# Patient Record
Sex: Female | Born: 1960 | Hispanic: No | Smoking: Current some day smoker
Health system: Southern US, Community
[De-identification: ages and names within clinical notes are randomized; demographics above are authoritative.]

## PROBLEM LIST (undated history)

## (undated) DIAGNOSIS — E119 Type 2 diabetes mellitus without complications: Secondary | ICD-10-CM

## (undated) DIAGNOSIS — M549 Dorsalgia, unspecified: Secondary | ICD-10-CM

## (undated) DIAGNOSIS — Z9889 Other specified postprocedural states: Secondary | ICD-10-CM

## (undated) DIAGNOSIS — M1990 Unspecified osteoarthritis, unspecified site: Secondary | ICD-10-CM

## (undated) DIAGNOSIS — R112 Nausea with vomiting, unspecified: Secondary | ICD-10-CM

## (undated) DIAGNOSIS — B019 Varicella without complication: Secondary | ICD-10-CM

## (undated) DIAGNOSIS — G43909 Migraine, unspecified, not intractable, without status migrainosus: Secondary | ICD-10-CM

## (undated) HISTORY — DX: Dorsalgia, unspecified: M54.9

## (undated) HISTORY — DX: Varicella without complication: B01.9

## (undated) HISTORY — PX: CHOLECYSTECTOMY: SHX55

## (undated) HISTORY — PX: BACK SURGERY: SHX140

## (undated) HISTORY — DX: Migraine, unspecified, not intractable, without status migrainosus: G43.909

---

## 2011-01-07 ENCOUNTER — Emergency Department: Payer: Self-pay | Admitting: Unknown Physician Specialty

## 2011-01-13 ENCOUNTER — Inpatient Hospital Stay: Payer: Self-pay | Admitting: Surgery

## 2011-09-20 ENCOUNTER — Observation Stay: Payer: Self-pay | Admitting: Surgery

## 2011-09-22 ENCOUNTER — Emergency Department: Payer: Self-pay | Admitting: *Deleted

## 2011-10-29 ENCOUNTER — Emergency Department: Payer: Self-pay | Admitting: Emergency Medicine

## 2011-11-07 ENCOUNTER — Emergency Department: Payer: Self-pay | Admitting: Emergency Medicine

## 2011-12-03 ENCOUNTER — Emergency Department: Payer: Self-pay | Admitting: Emergency Medicine

## 2011-12-03 LAB — CBC
HCT: 49.7 % — ABNORMAL HIGH (ref 35.0–47.0)
HGB: 16.8 g/dL — ABNORMAL HIGH (ref 12.0–16.0)
MCH: 28.9 pg (ref 26.0–34.0)
MCHC: 33.7 g/dL (ref 32.0–36.0)
MCV: 86 fL (ref 80–100)
RDW: 14.1 % (ref 11.5–14.5)

## 2011-12-03 LAB — COMPREHENSIVE METABOLIC PANEL
Anion Gap: 15 (ref 7–16)
Bilirubin,Total: 0.5 mg/dL (ref 0.2–1.0)
Chloride: 106 mmol/L (ref 98–107)
Co2: 18 mmol/L — ABNORMAL LOW (ref 21–32)
Creatinine: 0.83 mg/dL (ref 0.60–1.30)
EGFR (African American): >60
EGFR (Non-African Amer.): >60
Glucose: 130 mg/dL — ABNORMAL HIGH (ref 65–99)
Osmolality: 279 (ref 275–301)
Potassium: 3.5 mmol/L (ref 3.5–5.1)
SGPT (ALT): 31 U/L
Sodium: 139 mmol/L (ref 136–145)

## 2011-12-03 LAB — URINALYSIS, COMPLETE
Bilirubin,UR: NEGATIVE
Blood: NEGATIVE
Glucose,UR: NEGATIVE mg/dL (ref 0–75)
Nitrite: NEGATIVE
Ph: 5 (ref 4.5–8.0)
Specific Gravity: 1.034 (ref 1.003–1.030)
Squamous Epithelial: 8

## 2011-12-03 LAB — LIPASE, BLOOD: Lipase: 74 U/L (ref 73–393)

## 2012-02-23 ENCOUNTER — Inpatient Hospital Stay: Payer: Self-pay | Admitting: Internal Medicine

## 2012-02-23 LAB — COMPREHENSIVE METABOLIC PANEL
Albumin: 3.7 g/dL (ref 3.4–5.0)
Alkaline Phosphatase: 105 U/L (ref 50–136)
BUN: 10 mg/dL (ref 7–18)
Bilirubin,Total: 0.4 mg/dL (ref 0.2–1.0)
Calcium, Total: 8.9 mg/dL (ref 8.5–10.1)
Co2: 19 mmol/L — ABNORMAL LOW (ref 21–32)
Creatinine: 1.03 mg/dL (ref 0.60–1.30)
EGFR (Non-African Amer.): >60
Osmolality: 279 (ref 275–301)
SGOT(AST): 17 U/L (ref 15–37)
Sodium: 139 mmol/L (ref 136–145)

## 2012-02-23 LAB — URINALYSIS, COMPLETE
Blood: NEGATIVE
Nitrite: NEGATIVE
Ph: 5 (ref 4.5–8.0)
Squamous Epithelial: 8
WBC UR: 20 /HPF (ref 0–5)

## 2012-02-23 LAB — CBC
HGB: 16.6 g/dL — ABNORMAL HIGH (ref 12.0–16.0)
MCV: 86 fL (ref 80–100)
Platelet: 396 10*3/uL (ref 150–440)
RDW: 14.6 % — ABNORMAL HIGH (ref 11.5–14.5)

## 2012-02-23 LAB — SEDIMENTATION RATE: Erythrocyte Sed Rate: 8 mm/hr (ref 0–20)

## 2012-02-23 LAB — CLOSTRIDIUM DIFFICILE BY PCR

## 2012-02-23 LAB — IRON AND TIBC
Iron Bind.Cap.(Total): 332 ug/dL (ref 250–450)
Unbound Iron-Bind.Cap.: 274 ug/dL

## 2012-02-23 LAB — OCCULT BLOOD X 1 CARD TO LAB, STOOL: Occult Blood, Feces: NEGATIVE

## 2012-02-24 LAB — CBC WITH DIFFERENTIAL/PLATELET
Basophil #: 0 10*3/uL (ref 0.0–0.1)
Basophil %: 0.5 %
Eosinophil #: 0.1 10*3/uL (ref 0.0–0.7)
Eosinophil %: 1.2 %
HCT: 38.5 % (ref 35.0–47.0)
HGB: 13 g/dL (ref 12.0–16.0)
Lymphocyte #: 2 10*3/uL (ref 1.0–3.6)
Lymphocyte %: 31.9 %
MCH: 29.4 pg (ref 26.0–34.0)
MCHC: 33.9 g/dL (ref 32.0–36.0)
MCV: 87 fL (ref 80–100)
Monocyte %: 8 %
Neutrophil #: 3.6 10*3/uL (ref 1.4–6.5)
Neutrophil %: 58.4 %
Platelet: 284 10*3/uL (ref 150–440)
RBC: 4.43 10*6/uL (ref 3.80–5.20)

## 2012-02-24 LAB — MAGNESIUM: Magnesium: 1.7 mg/dL — ABNORMAL LOW

## 2012-02-24 LAB — BASIC METABOLIC PANEL
Calcium, Total: 8.2 mg/dL — ABNORMAL LOW (ref 8.5–10.1)
Chloride: 111 mmol/L — ABNORMAL HIGH (ref 98–107)
Creatinine: 0.6 mg/dL (ref 0.60–1.30)
EGFR (Non-African Amer.): >60
Osmolality: 283 (ref 275–301)
Sodium: 144 mmol/L (ref 136–145)

## 2012-02-24 LAB — HEMOGLOBIN A1C: Hemoglobin A1C: 5.9 % (ref 4.2–6.3)

## 2012-02-29 LAB — CULTURE, BLOOD (SINGLE)

## 2012-10-31 IMAGING — CR CERVICAL SPINE - 2-3 VIEW
1 series · 3 of 3 positions shown · non-contrast
Comparison: none

REASON FOR EXAM: neck pain
COMMENTS:

[Series 4: w cervical spine lat · 0.14mm/px · 3 of 3 slices shown]
[im 1/3]
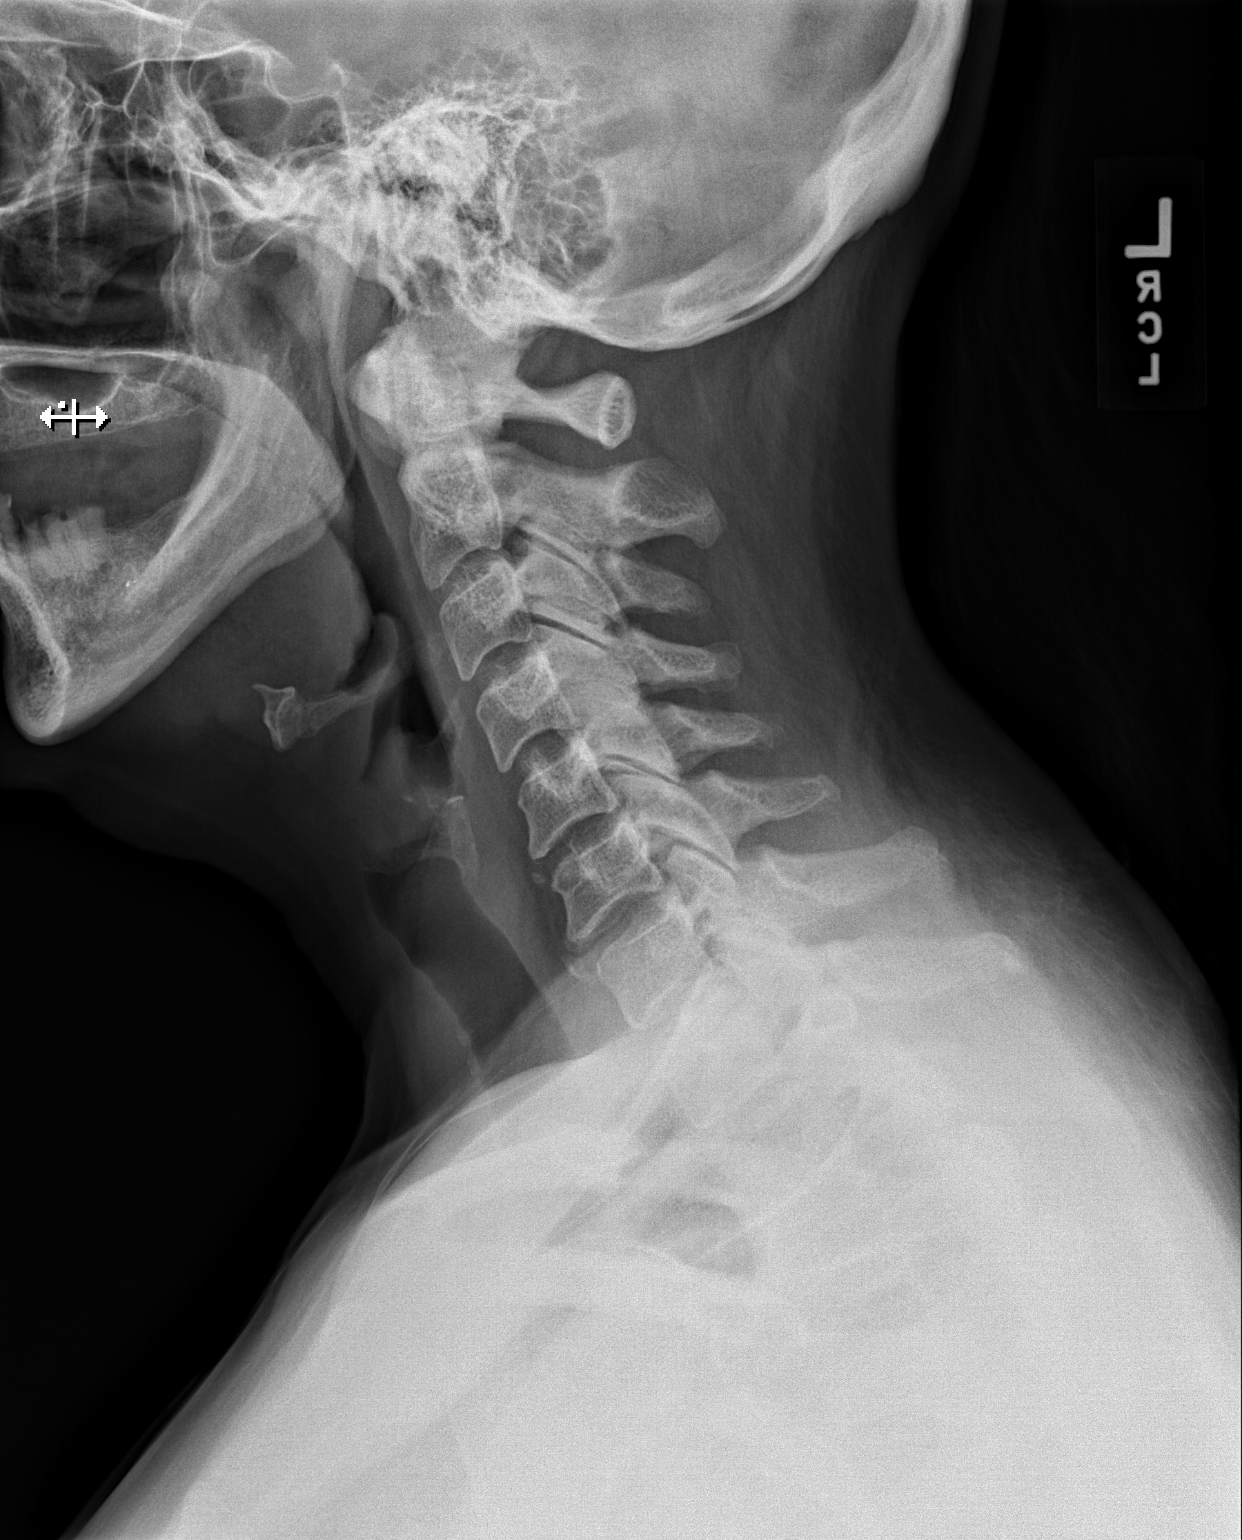
[im 2/3]
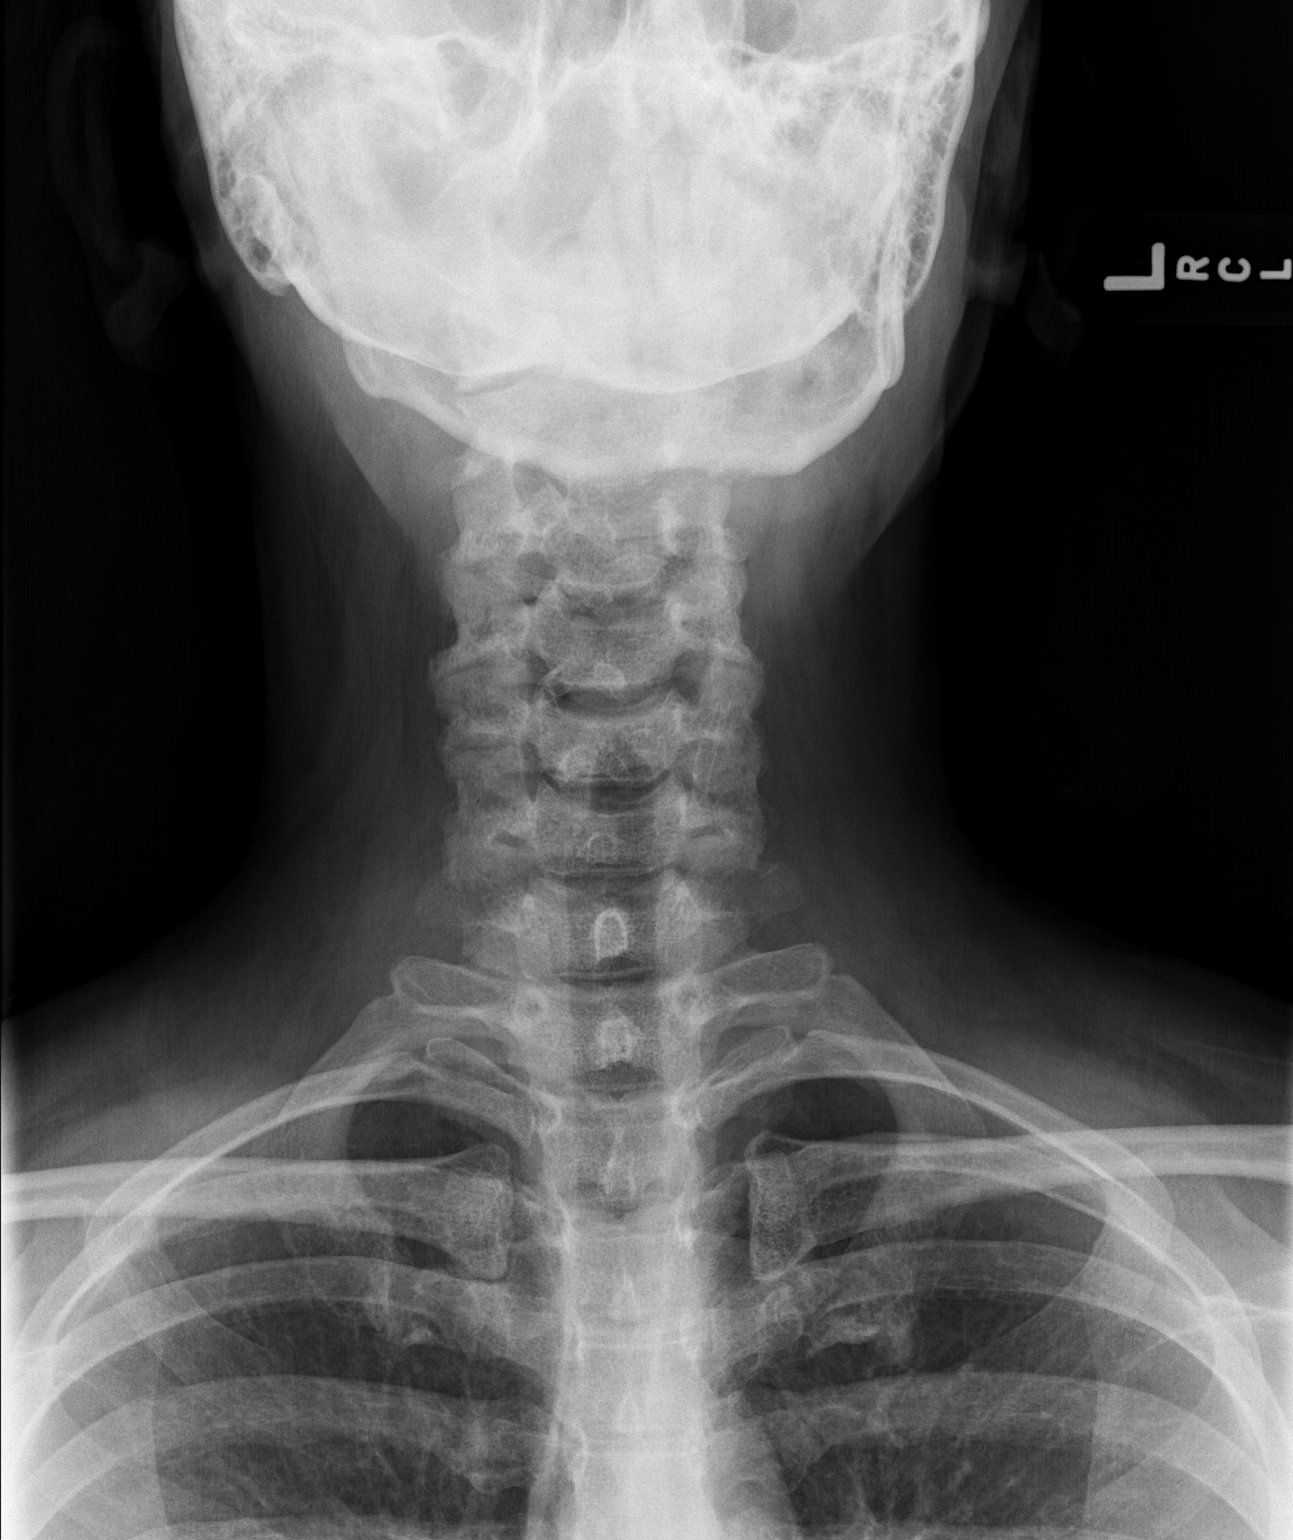
[im 3/3]
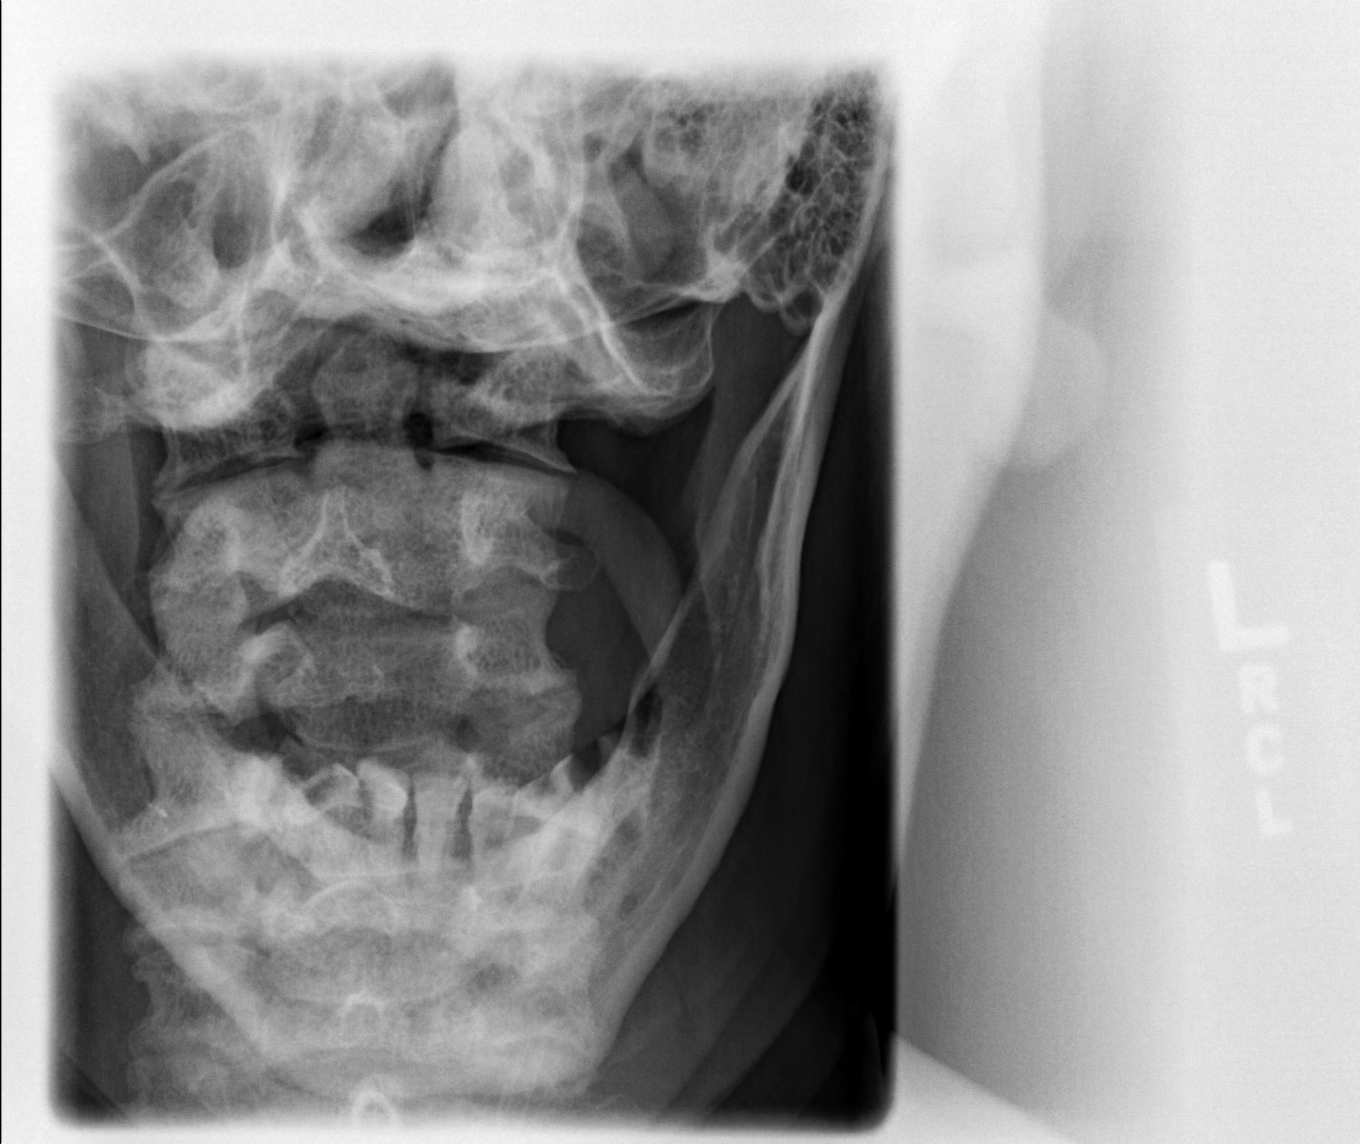

[3 of 3 positions shown; findings below may reference images not displayed]

PROCEDURE:     DXR - DXR C- SPINE AP AND LATERAL  - November 07, 2011  [DATE]

RESULT:     AP, lateral, and odontoid views of the cervical spine reveal the
vertebral bodies to be preserved in height. The intervertebral disc space
heights are well-maintained. Tiny anterior osteophytes or ligamentous
calcification is seen at the C5-C6 and C6-C7 levels. I see no evidence of a
perched facet or spinous process fracture. The odontoid is intact and the
lateral masses of C1 align normally with those of C2.
IMPRESSION: I see no acute abnormality of the cervical spine. Very mild
degenerative disc changes noted in the mid to lower cervical spine. Mild
facet joint hypertrophy is seen as well at the C4-C5 level.

## 2014-12-22 ENCOUNTER — Emergency Department
Admit: 2014-12-22 | Disposition: A | Discharge status: Home / Self Care | Payer: Self-pay | Admitting: Emergency Medicine

## 2014-12-22 LAB — CBC WITH DIFFERENTIAL/PLATELET
BASOS ABS: 0.1 10*3/uL (ref 0.0–0.1)
Basophil %: 0.4 %
EOS ABS: 0.1 10*3/uL (ref 0.0–0.7)
Eosinophil %: 0.7 %
HCT: 43.2 % (ref 35.0–47.0)
HGB: 14.2 g/dL (ref 12.0–16.0)
LYMPHS PCT: 14 %
Lymphocyte #: 2.5 10*3/uL (ref 1.0–3.6)
MCH: 28.2 pg (ref 26.0–34.0)
MCHC: 32.9 g/dL (ref 32.0–36.0)
MCV: 86 fL (ref 80–100)
MONOS PCT: 6.1 %
Monocyte #: 1.1 x10 3/mm — ABNORMAL HIGH (ref 0.2–0.9)
NEUTROS ABS: 14.3 10*3/uL — AB (ref 1.4–6.5)
Neutrophil %: 78.8 %
PLATELETS: 316 10*3/uL (ref 150–440)
RBC: 5.05 10*6/uL (ref 3.80–5.20)
RDW: 14.5 % (ref 11.5–14.5)
WBC: 18.2 10*3/uL — AB (ref 3.6–11.0)

## 2014-12-22 LAB — COMPREHENSIVE METABOLIC PANEL
ALBUMIN: 3.3 g/dL — AB
ALK PHOS: 73 U/L
ANION GAP: 8 (ref 7–16)
BUN: 12 mg/dL
Bilirubin,Total: 0.5 mg/dL
CHLORIDE: 109 mmol/L
CO2: 20 mmol/L — AB
CREATININE: 0.73 mg/dL
Calcium, Total: 8.7 mg/dL — ABNORMAL LOW
EGFR (African American): >60
EGFR (Non-African Amer.): >60
Glucose: 185 mg/dL — ABNORMAL HIGH
Potassium: 3.2 mmol/L — ABNORMAL LOW
SGOT(AST): 24 U/L
SGPT (ALT): 14 U/L
SODIUM: 137 mmol/L
Total Protein: 6.1 g/dL — ABNORMAL LOW

## 2014-12-23 LAB — URINALYSIS, COMPLETE
Bilirubin,UR: NEGATIVE
Blood: NEGATIVE
GLUCOSE, UR: NEGATIVE mg/dL (ref 0–75)
Ketone: NEGATIVE
LEUKOCYTE ESTERASE: NEGATIVE
NITRITE: NEGATIVE
PH: 5 (ref 4.5–8.0)
Protein: NEGATIVE
RBC,UR: 5 /HPF (ref 0–5)
Specific Gravity: 1.02 (ref 1.003–1.030)
Squamous Epithelial: 15
WBC UR: 11 /HPF (ref 0–5)

## 2015-01-14 NOTE — Consult Note (Signed)
PATIENT NAME:  Laura Small, KERSHAW MR#:  [id number] DATE OF BIRTH:  1961/08/04  DATE OF CONSULTATION:  02/23/2012  REFERRING PHYSICIAN:  Dr. Hyacinth Meeker CONSULTING PHYSICIAN:  Lurline Del, MD  REASON FOR CONSULTATION: Recurrent diarrhea.   HISTORY OF PRESENT ILLNESS: The patient is a 54 year old female who was admitted today after she developed sudden onset abdominal pain as well as diarrhea about two days ago. The pain was mostly located in the left lower quadrant area although it is now mostly diffuse. She also gives history of some reddish-brown stools, raising some concerns about the presence of blood. She had several bouts of diarrhea the day before yesterday as well as yesterday. She had only one loose bowel movement today. She denies any fever or chills, denies any travel, denies any sick contacts. The patient was in the hospital in January of this year as well as March with similar symptoms. She has been treated with Cipro and Flagyl. This appears to be her third episode since January of this year. The patient has had CT scans in the past which showed some inflammation in the terminal ileum, raising concern about possible inflammatory bowel disease. According to the patient, her mother recently died in the hospital; and she has been sleeping in her bed at home without washing the bed sheets or any other belongings.   PAST MEDICAL/SURGICAL HISTORY: Significant for cholecystectomy.   MEDICATIONS: None.   ALLERGIES: Bee stings.   SOCIAL HISTORY: She smokes about 1/2 pack a day. She does not drink except occasionally.   REVIEW OF SYSTEMS: Review of systems is grossly negative.   PHYSICAL EXAMINATION:  GENERAL: A fairly well-built female, does not appear to be in any acute distress.   VITAL SIGNS: Temperature 97.7, pulse 75, respirations 18 to 20, blood pressure 116/75.   SKIN: Skin is unremarkable.   NECK: Veins are flat.   LUNGS: Clear to auscultation bilaterally.   CARDIOVASCULAR:  Regular rate and rhythm.   ABDOMEN: Examination showed a soft and benign abdomen. There is diffuse abdominal tenderness without any rebound or guarding.   NEUROLOGICAL: Examination appears to be unremarkable.   LABORATORY, DIAGNOSTIC AND RADIOLOGICAL DATA:  Serum lipase is normal. Iron saturation is normal. Serum ferritin is normal.  Liver enzymes are normal.  Electrolytes, BUN, and creatinine are normal.  Sedimentation rate is only 8, white cell count is 15.8, hemoglobin 16.6.  Urinalysis: 20 white blood cells per high-power field, 3+ bacteria although there are some epithelial cells. Leukocyte esterase is trace. Nitrites negative. Urine cultures are pending.  Stool studies are positive for C. difficile toxin.   ASSESSMENT AND PLAN: The patient is with abdominal pain and diarrhea. This appears to be a case of C. difficile colitis. The patient has history of being treated with antibiotics in March. The C. difficile may have been related to her using her mother's belongings, who died in the hospital. The patient is currently on IV Cipro and Flagyl. I will discontinue Cipro, change Flagyl to p.o., gradually advance diet as tolerated. CT scan again is showing some thickening of the wall of the small intestine raising concern about possible chronic enteritis. Her sedimentation rate is normal. IBD panel is pending. I believe, once she is fully treated for C. difficile colitis, performing a colonoscopy would be beneficial for further evaluation of the colon and small intestine. The patient may require a capsule endoscopy as well. This has been discussed with the patient and she is in full agreement. I will follow.  ____________________________ Wynelle Link  Niel HummerIftikhar, MD si:cbb D: 02/23/2012 18:33:57 ET T: 02/24/2012 10:21:12 ET JOB#: [id number]  cc: Lurline DelShaukat Jamica Woodyard, MD, <Dictator> Lurline DelSHAUKAT Erisa Mehlman MD ELECTRONICALLY SIGNED 02/26/2012 12:18

## 2015-01-14 NOTE — Discharge Summary (Signed)
PATIENT NAME:  Laura Small, Laura Small MR#:  [id number] DATE OF BIRTH:  [birth year]  DATE OF ADMISSION:  02/23/2012 DATE OF DISCHARGE:  02/24/2012  PATIENT SIGNED OUT AGAINST MEDICAL ADVICE.   DISCHARGE DIAGNOSES:  1. Clostridium difficile colitis. 2. Possibility of inflammatory bowel disease with terminal ileitis.  3. Dehydration. 4. Hemoconcentration.  5. Leukocytosis.  6. Hyperglycemia. 7. Acidosis. 8. Tobacco abuse.   DISPOSITION: Patient signed out AGAINST MEDICAL ADVICE.   LABORATORY, DIAGNOSTIC, AND RADIOLOGICAL DATA: CT of the abdomen showed terminal ileitis/enteritis. Stool is positive for Clostridium difficile. Blood cultures negative. Urine culture mixed bacterial organisms. White count 15 on admission, 6.2 by the time of discharge. Hemoglobin 16.6 on admission, 13 by the time of discharge. Glucose 136. Normal iron studies. Magnesium 1.7. Hemoglobin A1c 5.9.   CONSULTATION: GI consultation with Dr. Niel HummerIftikhar.   HOSPITAL COURSE: Patient is a 54 year old female with no significant past medical history who presented with nausea, vomiting, abdominal pain and diarrhea. On CAT scan she was found to have enteritis/terminal ileitis. This was her third episode of similar complaints since December 2012. She was admitted to the hospital and started her on empiric antibiotics. GI consultation was also obtained. There was clinical concern for inflammatory bowel disease. Patient's stool was sent for additional testing and became positive for C. difficile. Before patient could be seen she signed herself out AGAINST MEDICAL ADVICE on 02/23/2012.    TIME SPENT: 45 minutes.   ____________________________ Darrick MeigsSangeeta Keilee Denman, MD sp:cms D: 02/24/2012 14:47:21 ET T: 02/25/2012 10:16:46 ET JOB#: [id number]  cc: Darrick MeigsSangeeta Shyanne Mcclary, MD, <Dictator>  Darrick MeigsSANGEETA Tyberius Ryner MD ELECTRONICALLY SIGNED 02/25/2012 14:58

## 2015-01-14 NOTE — H&P (Signed)
PATIENT NAME:  Laura Small, Laura Small MR#:  [id number] DATE OF BIRTH:  1961/05/03  DATE OF ADMISSION:  02/23/2012  ER REFERRING PHYSICIAN: Arman Filter, MD  PRIMARY CARE PHYSICIAN: Rudie Meyer, MD  CHIEF COMPLAINT: Nausea, diarrhea, and abdominal pain.   HISTORY OF PRESENT ILLNESS: The patient is a 54 year old female with no significant past medical history who developed sudden onset of abdominal pain, in the left lower quadrant/mid quadrant, and developed sudden onset of abdominal pain in the left lower quadrant and mid quadrant two days ago. Subsequently she developed nausea and diarrhea. The patient reports that she has not actually vomited. She is having multiple bowel movements described as watery, root beer, which is reddish-brown color. The diarrhea is keeping her up at night. She has constant abdominal pain with intermittent cramping prior to diarrhea. She reports that she cannot afford to eat out therefore mostly eats at home. She denies eating any recent canned food, any sick contacts. She does have several pets none of which have been sick. She denies any recent travel. She was treated with Cipro and Flagyl in January and March. Review of old records show that this is the patient's third episode since December of 2012 with similar presentation of nausea, abdominal pain, and diarrhea. She has had two CAT scans, one in March of 2013 and the second one in March of 2012, which have shown inflammation in her ileum/small bowel. The patient reports that this was her third episode of possible gastroenteritis since December. In March she came in with similar symptoms, but at that time was taking Lipozene which had precipitated her gastrointestinal symptoms. The first two times she was diagnosed with gastroenteritis and treated with Cipro and Flagyl. As mentioned earlier, this is her third episode of similar symptoms since December 2012. She reports no family history of inflammatory bowel disease in any of  her family members. She reports that she has been under a lot of stress in the last two weeks, especially in the last five days.   ALLERGIES: Bee stings.  MEDICATIONS: None.   PAST MEDICAL HISTORY: None.   PAST SURGICAL HISTORY:  1. Cholecystectomy 01/02/2012.  2. Back surgery March 2001.   SOCIAL HISTORY: The patient smokes five cigarettes per day. Occasional alcohol use. Denies any drug abuse. Lives with her boyfriend. 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   FAMILY HISTORY: Mother has diabetes. Father had heart disease.   REVIEW OF SYSTEMS: CONSTITUTIONAL: Denies any fever, fatigue, or weakness. EYES: Denies any blurred or double vision. Wears glasses. ENT: Denies any tinnitus or ear pain. RESPIRATORY: Denies any cough or wheezing. CARDIOVASCULAR: Denies any chest pain or palpitations. GI: Reports nausea. No vomiting. Reports diarrhea and abdominal pain. GU: Denies any dysuria or hematuria. ENDOCRINE: Denies any polyuria or nocturia. HEME/LYMPH: Denies any anemia or easy bruisability. INTEGUMENT: Denies any acne or rash. MUSCULOSKELETAL: Denies any swelling or gout. NEUROLOGICAL: Denies any numbness or weakness. PSYCH: Has been having a lot of anxiety and stress recently.   PHYSICAL EXAMINATION:   VITAL SIGNS: Temperature 96.2, heart rate 128 initially, respiratory rate 26, blood pressure 172/84, pulse oximetry 98%.   GENERAL: The patient is a 54 year old female sitting comfortably in bed, not in acute distress.   HEAD: Atraumatic, normocephalic.   EYES: There is no pallor, icterus, or cyanosis. Pupils are equal, round, and reactive to light and accommodation. Extraocular movements intact.   ENT: Wet mucous membranes. Poor dental hygiene.   NECK: Supple.  No masses. No JVD. No thyromegaly or lymphadenopathy.   CHEST WALL: No tenderness to palpation. Not using accessory muscles of respiration. No intercostal muscle retractions.   LUNGS:  Bilaterally clear to auscultation. No wheezing, rales, or rhonchi.  HEART: S1 and S2 regular. No murmur, rubs, or gallops.   ABDOMEN: Soft. Nondistended. No guarding or rigidity. Hyperactive bowel sounds. The patient has tenderness to palpation in her left lower quadrant and in the periumbilical region. The patient is very tender to palpation in her left lower quadrant and around the periumbilical region.   MUSCULOSKELETAL: No cyanosis or clubbing.   SKIN: No rashes or lesions.   PERIPHERIES: No pedal edema. 2+ pedal pulses.   MUSCULOSKELETAL: No cyanosis or clubbing.   NEUROLOGIC: Awake, alert, and oriented x3. Cranial nerves grossly intact. Nonfocal neurological exam.   PSYCH: Normal mood and affect.  RESULTS: CT of the abdomen shows terminal ileitis similar to prior CAT scans.   Urinalysis shows evidence of urinary tract infection.  Glucose 136, BUN 10, creatinine 1.03, sodium 139, potassium 3.6, chloride 107, CO2 19, calcium 8.9, bilirubin 0.4. The rest of the hepatic panel is normal. Lipase is normal. White count 15.8, hemoglobin 16.6, hematocrit 49.8, platelets 396.   ASSESSMENT AND PLAN:  1. A 54 year old female with no significant past medical history who presents with nausea, abdominal pain and diarrhea. This is the patient's third episode of similar symptoms since December 2012. In the past, she was treated for possible gastroenteritis. Differential diagnoses includes irritable bowel syndrome, inflammatory bowel disease, and gastroenteritis. The patient reports that she has been under a lot of stress recently which is a risk factor for irritable bowel disease; however, she has elevated white count with reddish-brown diarrhea with terminal ileitis seen on three abdominal CT scans which favors inflammatory bowel disease. We will admit the patient to the hospital, start her on IV fluids and empiric antibiotics with Cipro and Flagyl, p.r.n. antiemetics and analgesics. We will check  stool for C. difficile, Giardia ova and parasite, comprehensive culture, and occult blood. We will also check ANCA CRP, ferritin, iron studies, ASCA antibodies, ESR, and vitamin B12. We will get a gastroenterology consultation.  2. Initial tachycardia and elevated blood pressure. The patient has no diagnosis of hypertension. This was likely related to acute distress resulting from significant abdominal pain. The patient's heart rate and blood pressure are currently well controlled. We will continue to monitor.  3. Urinary tract infection. The patient's urinalysis is suggestive of a UTI. We will get urine cultures and empirically treat with Cipro until final ID and sensitivities are available.  4. Leukocytosis, likely due to above. We will monitor closely on antibiotics.  5. Hemoconcentration with elevated hemoglobin and hematocrit. Likely due to dehydration resulting from diarrhea. We will hydrate with IV fluids.  6. Hyperglycemia. The patient has no history of diabetes. We will check hemoglobin A1c.  7. Acidosis. Likely due to ongoing diarrhea and GI symptoms. We will hydrate with IV fluids and monitor closely.  8. History of smoking. The patient was counseled about cessation for more than three minutes.  I reviewed all medical records and discussed with the patient and her brother at bedside the plan of care and management.   TIME SPENT: 75 minutes.  ____________________________ Cherre Huger, MD sp:slb D: 02/23/2012 08:46:33 ET T: 02/23/2012 09:27:01 ET JOB#: [id number]  cc: Cherre Huger, MD, <Dictator> Rudie Meyer, MD Cherre Huger MD ELECTRONICALLY SIGNED 02/24/2012 15:20

## 2015-01-14 NOTE — Discharge Summary (Signed)
PATIENT NAME:  Laura MochaCRADDOCK, Fatimah MR#:  [id number] DATE OF BIRTH:  10/08/1960  DATE OF ADMISSION:  09/20/2011 DATE OF DISCHARGE:  09/21/2011  FINAL DIAGNOSIS: Gastroenteritis.   PROCEDURE:  1. CT scan of the abdomen and pelvis.  2. Intravenous Cipro and Flagyl.   HOSPITAL COURSE: The patient was admitted to the surgical service with significant right lower quadrant abdominal pain. CAT scan demonstrated findings consistent with enteritis in the terminal ileum. Clinically she improved rapidly with hydration, intravenous narcotics, and intravenous Cipro and Flagyl. She had several loose stools probably secondary to the oral contrast administered the night before. Over the course of December 30th the patient continued to improve and tolerated a clear liquid diet. Repeat examination on the evening of the 30th demonstrated her abdomen to be benign and her vital signs to be stable. Her white blood cell count fell from 12.8 on admission to 8.1. She remained hemodynamically stable with no signs of fever. She was deemed suitable for discharge with follow-up in the office on 10/03/2011 in [place] in [place].   DISCHARGE MEDICATIONS:  1. Cipro 500 mg by mouth b.i.d. for seven days. 2. Flagyl 500 mg by mouth t.i.d. for seven days.  3. Imodium as needed over-the-counter take as instructed as loose stools may or may not continue.   ____________________________ Redge GainerMark A. Egbert GaribaldiBird, MD mab:rbg D: 09/21/2011 20:23:56 ET T: 09/24/2011 13:36:24 ET JOB#: [id number]  cc: Loraine LericheMark A. Egbert GaribaldiBird, MD, <Dictator> Eugene Isadore Kela MillinA Janiel Crisostomo MD ELECTRONICALLY SIGNED 09/25/2011 8:35

## 2015-01-14 NOTE — Consult Note (Signed)
Brief Consult Note: Diagnosis: C. diff colitis.   Patient was seen by consultant.   Consult note dictated.   Orders entered.   Comments: C. diff colitis. ? Underlying enteritis such as IBD.  Recommendations: DC Cipro. Change Flagyl to PO. Recommend OP colonoscopy =/- capsule endoscopy for further evaluation of suspected IBD. Will follow.  Electronic Signatures: Lurline DelIftikhar, Janith Nielson (MD)  (Signed 03-Jun-13 18:28)  Authored: Brief Consult Note   Last Updated: 03-Jun-13 18:28 by Lurline DelIftikhar, Jovana Rembold (MD)

## 2015-01-20 ENCOUNTER — Other Ambulatory Visit: Payer: Self-pay

## 2015-01-20 ENCOUNTER — Emergency Department
Admit: 2015-01-20 | Disposition: A | Discharge status: Home / Self Care | Payer: Self-pay | Admitting: Emergency Medicine

## 2015-01-20 LAB — CBC WITH DIFFERENTIAL/PLATELET
BASOS ABS: 0.1 10*3/uL (ref 0.0–0.1)
Basophil %: 0.6 %
EOS ABS: 0 10*3/uL (ref 0.0–0.7)
EOS PCT: 0.6 %
HCT: 45.3 % (ref 35.0–47.0)
HGB: 15.2 g/dL (ref 12.0–16.0)
LYMPHS PCT: 44.2 %
Lymphocyte #: 3.8 10*3/uL — ABNORMAL HIGH (ref 1.0–3.6)
MCH: 28.6 pg (ref 26.0–34.0)
MCHC: 33.6 g/dL (ref 32.0–36.0)
MCV: 85 fL (ref 80–100)
MONO ABS: 0.6 x10 3/mm (ref 0.2–0.9)
MONOS PCT: 7.4 %
NEUTROS PCT: 47.2 %
Neutrophil #: 4.1 10*3/uL (ref 1.4–6.5)
Platelet: 386 10*3/uL (ref 150–440)
RBC: 5.32 10*6/uL — AB (ref 3.80–5.20)
RDW: 14.1 % (ref 11.5–14.5)
WBC: 8.6 10*3/uL (ref 3.6–11.0)

## 2015-01-20 LAB — URINALYSIS, COMPLETE
BLOOD: NEGATIVE
Bilirubin,UR: NEGATIVE
Glucose,UR: NEGATIVE mg/dL (ref 0–75)
KETONE: NEGATIVE
Leukocyte Esterase: NEGATIVE
Nitrite: NEGATIVE
PROTEIN: NEGATIVE
Ph: 9 (ref 4.5–8.0)
SPECIFIC GRAVITY: 1.015 (ref 1.003–1.030)

## 2015-01-20 LAB — COMPREHENSIVE METABOLIC PANEL
ALT: 22 U/L
AST: 28 U/L
Albumin: 3.9 g/dL
Alkaline Phosphatase: 106 U/L
Anion Gap: 7 (ref 7–16)
BUN: 12 mg/dL
Bilirubin,Total: 0.2 mg/dL — ABNORMAL LOW
CHLORIDE: 108 mmol/L
Calcium, Total: 9.2 mg/dL
Co2: 24 mmol/L
Creatinine: 0.98 mg/dL
EGFR (African American): >60
EGFR (Non-African Amer.): >60
GLUCOSE: 116 mg/dL — AB
Potassium: 4.1 mmol/L
SODIUM: 139 mmol/L
Total Protein: 7.5 g/dL

## 2015-01-20 LAB — LIPASE, BLOOD: LIPASE: 57 U/L — AB

## 2015-01-20 LAB — TROPONIN I

## 2015-01-21 MED ORDER — SODIUM CHLORIDE 0.9 % IJ SOLN
3.0000 mL | INTRAMUSCULAR | Status: DC | PRN
Start: 2015-01-21 — End: 2016-09-08

## 2015-01-21 MED ORDER — FENTANYL CITRATE (PF) 100 MCG/2ML IJ SOLN: 50.0000 ug | INTRAMUSCULAR | Status: DC | PRN

## 2015-02-07 ENCOUNTER — Encounter
Admission: RE | Admit: 2015-02-07 | Discharge: 2015-02-07 | Disposition: A | Discharge status: Home / Self Care | Payer: Self-pay | Source: Ambulatory Visit | Attending: Surgery | Admitting: Surgery

## 2015-02-07 DIAGNOSIS — Z01812 Encounter for preprocedural laboratory examination: Secondary | ICD-10-CM | POA: Insufficient documentation

## 2015-02-07 DIAGNOSIS — Z01810 Encounter for preprocedural cardiovascular examination: Secondary | ICD-10-CM | POA: Insufficient documentation

## 2015-02-07 DIAGNOSIS — K432 Incisional hernia without obstruction or gangrene: Secondary | ICD-10-CM | POA: Insufficient documentation

## 2015-02-07 HISTORY — DX: Nausea with vomiting, unspecified: R11.2

## 2015-02-07 HISTORY — DX: Other specified postprocedural states: Z98.890

## 2015-02-07 LAB — BASIC METABOLIC PANEL
Anion gap: 7 (ref 5–15)
BUN: 8 mg/dL (ref 6–20)
CALCIUM: 9.2 mg/dL (ref 8.9–10.3)
CO2: 25 mmol/L (ref 22–32)
Chloride: 105 mmol/L (ref 101–111)
Creatinine, Ser: 0.75 mg/dL (ref 0.44–1.00)
GFR calc Af Amer: >60 mL/min (ref >60)
GFR calc non Af Amer: >60 mL/min (ref >60)
GLUCOSE: 129 mg/dL — AB (ref 65–99)
POTASSIUM: 3.5 mmol/L (ref 3.5–5.1)
Sodium: 137 mmol/L (ref 135–145)

## 2015-02-07 LAB — CBC
HCT: 46.6 % (ref 35.0–47.0)
Hemoglobin: 15.4 g/dL (ref 12.0–16.0)
MCH: 28.2 pg (ref 26.0–34.0)
MCHC: 32.9 g/dL (ref 32.0–36.0)
MCV: 85.6 fL (ref 80.0–100.0)
Platelets: 307 10*3/uL (ref 150–440)
RBC: 5.45 MIL/uL — ABNORMAL HIGH (ref 3.80–5.20)
RDW: 14.5 % (ref 11.5–14.5)
WBC: 9.2 10*3/uL (ref 3.6–11.0)

## 2015-02-07 LAB — DIFFERENTIAL
BASOS ABS: 0.1 10*3/uL (ref 0–0.1)
Eosinophils Absolute: 0.1 10*3/uL (ref 0–0.7)
Lymphocytes Relative: 26 %
Lymphs Abs: 2.4 10*3/uL (ref 1.0–3.6)
Monocytes Absolute: 0.8 10*3/uL (ref 0.2–0.9)
Monocytes Relative: 9 %
Neutro Abs: 5.8 10*3/uL (ref 1.4–6.5)
Neutrophils Relative %: 63 %

## 2015-02-07 NOTE — Patient Instructions (Signed)
Your procedure is scheduled on: Monday May 23,2016 Report to Day Surgery. Medical Mall Entrance To find out your arrival time please call [phone] between 1PM - 3PM on Friday Feb 09, 2015.  Remember: Instructions that are not followed completely may result in serious medical risk, up to and including death, or upon the discretion of your surgeon and anesthesiologist your surgery may need to be rescheduled.    __x__ 1. Do not eat food or drink liquids after midnight. No gum chewing or hard candies.     __x__ 2. No Alcohol for 24 hours before or after surgery.   ____ 3. Bring all medications with you on the day of surgery if instructed.    __x__ 4. Notify your doctor if there is any change in your medical condition     (cold, fever, infections).     Do not wear jewelry, make-up, hairpins, clips or nail polish.  Do not wear lotions, powders, or perfumes.   Do not shave 48 hours prior to surgery. Men may shave face and neck.  Do not bring valuables to the hospital.    [facility] is not responsible for any belongings or valuables.               Contacts, dentures or bridgework may not be worn into surgery.  Leave your suitcase in the car. After surgery it may be brought to your room.  For patients admitted to the hospital, discharge time is determined by your treatment team.   Patients discharged the day of surgery will not be allowed to drive home.   Please read over the following fact sheets that you were given:   Surgical Site Infection Prevention   ____ Take these medicines the morning of surgery with A SIP OF WATER:    1.   2.   3.   4.  5.  6.  ____ Fleet Enema (as directed)   __x__ Use CHG Soap as directed  ____ Use inhalers on the day of surgery  ____ Stop metformin 2 days prior to surgery    ____ Take 1/2 of usual insulin dose the night before surgery and none on the morning of surgery.   ____ Stop Coumadin/Plavix/aspirin on   ____ Stop  Anti-inflammatories on    ____ Stop supplements until after surgery.    ____ Bring C-Pap to the hospital.                Pain Relief Preoperatively and Postoperatively Being a good patient does not mean being a silent one.If you have questions, problems, or concerns about the pain you may feel after surgery, let your caregiver know.Patients have the right to assessment and management of pain. The treatment of pain after surgery is important to speed up recovery and return to normal activities. Severe pain after surgery, and the fear or anxiety associated with that pain, may cause extreme discomfort that:  Prevents sleep.  Decreases the ability to breathe deeply and cough. This can cause pneumonia or other upper airway infections.  Causes your heart to beat faster and your blood pressure to be higher.  Increases the risk for constipation and bloating.  Decreases the ability of wounds to heal.  May result in depression, increased anxiety, and feelings of helplessness. Relief of pain before surgery is also important because it will lessen the pain after surgery. Patients who receive both pain relief before and after surgery experience greater pain relief than those who only receive pain relief  after surgery. Let your caregiver know if you are having uncontrolled pain.This is very important.Pain after surgery is more difficult to manage if it is permitted to become severe, so prompt and adequate treatment of acute pain is necessary. PAIN CONTROL METHODS Your caregivers follow policies and procedures about the management of patient pain.These guidelines should be explained to you before surgery.Plans for pain control after surgery must be mutually decided upon and instituted with your full understanding and agreement.Do not be afraid to ask questions regarding the care you are receiving.There are many different ways your caregivers will attempt to control your pain, including  the following methods. As needed pain control  You may be given pain medicine either through your intravenous (IV) tube, or as a pill or liquid you can swallow. You will need to let your caregiver know when you are having pain. Then, your caregiver will give you the pain medicine ordered for you.  Your pain medicine may make you constipated. If constipation occurs, drink more liquids if you can. Your caregiver may have you take a mild laxative. IV patient-controlled analgesia pump (PCA pump)  You can get your pain medicine through the IV tube which goes into your vein. You are able to control the amount of pain medicine that you get. The pain medicine flows in through an IV tube and is controlled by a pump. This pump gives you a set amount of pain medicine when you push the button hooked up to it. Nobody should push this button but you or someone specifically assigned by you to do so. It is set up to keep you from accidentally giving yourself too much pain medicine. You will be able to start using your pain pump in the recovery room after your surgery. This method can be helpful for most types of surgery.  If you are still having too much pain, tell your caregiver. Also, tell your caregiver if you are feeling too sleepy or nauseous. Continuous epidural pain control  A thin, soft tube (catheter) is put into your back. Pain medicine flows through the catheter to lessen pain in the part of your body where the surgery is done. Continuous epidural pain control may work best for you if you are having surgery on your chest, abdomen, hip area, or legs. The epidural catheter is usually put into your back just before surgery. The catheter is left in until you can eat and take medicine by mouth. In most cases, this may take 2 to 3 days.  Giving pain medicine through the epidural catheter may help you heal faster because:  Your bowel gets back to normal faster.  You can get back to eating sooner.  You can  be up and walking sooner. Medicine that numbs the area (local anesthetic)  You may receive an injection of pain medicine near where the pain is (local infiltration).  You may receive an injection of pain medicine near the nerve that controls the sensation to a specific part of the body (peripheral nerve block).  Medicine may be put in the spine to block pain (spinal block). Opioids  Moderate to moderately severe acute pain after surgery may respond to opioids.Opioids are narcotic pain medicine. Opioids are often combined with non-narcotic medicines to improve pain relief, diminish the risk of side effects, and reduce the chance of addiction.  If you follow your caregiver's directions about taking opioids and you do not have a history of substance abuse, your risk of becoming addicted is exceptionally small.Opioids  are given for short periods of time in careful doses to prevent addiction. Other methods of pain control include:  Steroids.  Physical therapy.  Heat and cold therapy.  Compression, such as wrapping an elastic bandage around the area of pain.  Massage. These various ways of controlling pain may be used together. Combining different methods of pain control is called multimodal analgesia. Using this approach has many benefits, including being able to eat, move around, and leave the hospital sooner. Document Released: 11/29/2002 Document Revised: 12/01/2011 Document Reviewed: 12/03/2010 [facility] Patient Information 2015 [place], [place]. This information is not intended to replace advice given to you by your health care provider. Make sure you discuss any questions you have with your health care provider.

## 2015-02-12 ENCOUNTER — Ambulatory Visit: Payer: Self-pay | Admitting: *Deleted

## 2015-02-12 ENCOUNTER — Encounter
Admission: RE | Disposition: A | Discharge status: Home / Self Care | Payer: Self-pay | Source: Ambulatory Visit | Attending: Surgery

## 2015-02-12 ENCOUNTER — Encounter: Payer: Self-pay | Admitting: *Deleted

## 2015-02-12 ENCOUNTER — Ambulatory Visit
Admission: RE | Admit: 2015-02-12 | Discharge: 2015-02-12 | Disposition: A | Discharge status: Home / Self Care | Payer: Self-pay | Source: Ambulatory Visit | Attending: Surgery | Admitting: Surgery

## 2015-02-12 DIAGNOSIS — Z9889 Other specified postprocedural states: Secondary | ICD-10-CM | POA: Insufficient documentation

## 2015-02-12 DIAGNOSIS — F17210 Nicotine dependence, cigarettes, uncomplicated: Secondary | ICD-10-CM | POA: Insufficient documentation

## 2015-02-12 DIAGNOSIS — Z91030 Bee allergy status: Secondary | ICD-10-CM | POA: Insufficient documentation

## 2015-02-12 DIAGNOSIS — Z79899 Other long term (current) drug therapy: Secondary | ICD-10-CM | POA: Insufficient documentation

## 2015-02-12 DIAGNOSIS — K432 Incisional hernia without obstruction or gangrene: Secondary | ICD-10-CM | POA: Insufficient documentation

## 2015-02-12 HISTORY — PX: VENTRAL HERNIA REPAIR: SHX424

## 2015-02-12 HISTORY — PX: INSERTION OF MESH: SHX5868

## 2015-02-12 SURGERY — REPAIR, HERNIA, VENTRAL
Anesthesia: General | Wound class: Clean

## 2015-02-12 MED ORDER — NEOSTIGMINE METHYLSULFATE 10 MG/10ML IV SOLN
INTRAVENOUS | Status: DC | PRN
  Administered 2015-02-12: 4.5 mg via INTRAVENOUS

## 2015-02-12 MED ORDER — MIDAZOLAM HCL 2 MG/2ML IJ SOLN
INTRAMUSCULAR | Status: DC | PRN
  Administered 2015-02-12: 1 mg via INTRAVENOUS

## 2015-02-12 MED ORDER — DEXAMETHASONE SODIUM PHOSPHATE 10 MG/ML IJ SOLN
8.0000 mg | Freq: Once | INTRAMUSCULAR | Status: AC | PRN
  Administered 2015-02-12: 8 mg via INTRAVENOUS

## 2015-02-12 MED ORDER — ONDANSETRON HCL 4 MG/2ML IJ SOLN
INTRAMUSCULAR | Status: DC | PRN
  Administered 2015-02-12: 4 mg via INTRAVENOUS

## 2015-02-12 MED ORDER — FENTANYL CITRATE (PF) 100 MCG/2ML IJ SOLN
INTRAMUSCULAR | Status: DC | PRN
  Administered 2015-02-12 (×2): 50 ug via INTRAVENOUS

## 2015-02-12 MED ORDER — CEFAZOLIN SODIUM-DEXTROSE 2-3 GM-% IV SOLR
INTRAVENOUS | Status: DC | PRN
  Administered 2015-02-12: 2 g via INTRAVENOUS

## 2015-02-12 MED ORDER — NEOMYCIN-POLYMYXIN B GU 40-200000 IR SOLN
Status: AC
  Filled 2015-02-12: qty 2

## 2015-02-12 MED ORDER — HYDROMORPHONE HCL 1 MG/ML IJ SOLN
INTRAMUSCULAR | Status: AC
  Administered 2015-02-12: 0.5 mg via INTRAVENOUS
  Filled 2015-02-12: qty 1

## 2015-02-12 MED ORDER — DEXAMETHASONE SODIUM PHOSPHATE 10 MG/ML IJ SOLN
INTRAMUSCULAR | Status: AC
  Filled 2015-02-12: qty 1

## 2015-02-12 MED ORDER — HEPARIN SODIUM (PORCINE) 5000 UNIT/ML IJ SOLN
INTRAMUSCULAR | Status: AC
  Administered 2015-02-12: 5000 [IU] via SUBCUTANEOUS
  Filled 2015-02-12: qty 1

## 2015-02-12 MED ORDER — KETOROLAC TROMETHAMINE 30 MG/ML IJ SOLN
INTRAMUSCULAR | Status: DC | PRN
  Administered 2015-02-12: 30 mg via INTRAVENOUS

## 2015-02-12 MED ORDER — FENTANYL CITRATE (PF) 100 MCG/2ML IJ SOLN
50.0000 ug | Freq: Once | INTRAMUSCULAR | Status: AC
Start: 2015-02-12 — End: 2015-02-12
  Administered 2015-02-12: 100 ug via INTRAVENOUS
  Filled 2015-02-12: qty 1

## 2015-02-12 MED ORDER — FAMOTIDINE 20 MG PO TABS
20.0000 mg | ORAL_TABLET | Freq: Once | ORAL | Status: AC
  Administered 2015-02-12: 20 mg via ORAL

## 2015-02-12 MED ORDER — HYDROMORPHONE HCL 1 MG/ML IJ SOLN
0.2500 mg | INTRAMUSCULAR | Status: DC | PRN
  Administered 2015-02-12 (×4): 0.5 mg via INTRAVENOUS

## 2015-02-12 MED ORDER — BUPIVACAINE HCL (PF) 0.25 % IJ SOLN
INTRAMUSCULAR | Status: AC
  Filled 2015-02-12: qty 30

## 2015-02-12 MED ORDER — OXYCODONE-ACETAMINOPHEN 5-325 MG PO TABS: 1.0000 | ORAL_TABLET | Freq: Four times a day (QID) | ORAL | Status: DC | PRN

## 2015-02-12 MED ORDER — CEFAZOLIN SODIUM-DEXTROSE 2-3 GM-% IV SOLR
INTRAVENOUS | Status: AC
  Administered 2015-02-12: 2 g via INTRAVENOUS
  Filled 2015-02-12: qty 50

## 2015-02-12 MED ORDER — GLYCOPYRROLATE 0.2 MG/ML IJ SOLN
INTRAMUSCULAR | Status: DC | PRN
  Administered 2015-02-12: .6 mg via INTRAVENOUS

## 2015-02-12 MED ORDER — LACTATED RINGERS IV SOLN
INTRAVENOUS | Status: DC
  Administered 2015-02-12 (×2): via INTRAVENOUS

## 2015-02-12 MED ORDER — LIDOCAINE HCL (CARDIAC) 20 MG/ML IV SOLN
INTRAVENOUS | Status: DC | PRN
  Administered 2015-02-12: 30 mg via INTRAVENOUS

## 2015-02-12 MED ORDER — FENTANYL CITRATE (PF) 100 MCG/2ML IJ SOLN
INTRAMUSCULAR | Status: AC
  Administered 2015-02-12: 100 ug via INTRAVENOUS
  Filled 2015-02-12: qty 2

## 2015-02-12 MED ORDER — FAMOTIDINE 20 MG PO TABS
ORAL_TABLET | ORAL | Status: AC
  Administered 2015-02-12: 20 mg via ORAL
  Filled 2015-02-12: qty 1

## 2015-02-12 MED ORDER — PROPOFOL 10 MG/ML IV BOLUS
INTRAVENOUS | Status: DC | PRN
  Administered 2015-02-12: 200 mg via INTRAVENOUS

## 2015-02-12 MED ORDER — CEFAZOLIN SODIUM-DEXTROSE 2-3 GM-% IV SOLR
2.0000 g | Freq: Once | INTRAVENOUS | Status: AC
  Administered 2015-02-12: 2 g via INTRAVENOUS

## 2015-02-12 MED ORDER — BUPIVACAINE HCL (PF) 0.25 % IJ SOLN
INTRAMUSCULAR | Status: DC | PRN
  Administered 2015-02-12: 20 mL

## 2015-02-12 MED ORDER — HEPARIN SODIUM (PORCINE) 5000 UNIT/ML IJ SOLN
5000.0000 [IU] | Freq: Once | INTRAMUSCULAR | Status: AC
  Administered 2015-02-12: 5000 [IU] via SUBCUTANEOUS

## 2015-02-12 MED ORDER — ROCURONIUM BROMIDE 100 MG/10ML IV SOLN
INTRAVENOUS | Status: DC | PRN
  Administered 2015-02-12: 50 mg via INTRAVENOUS

## 2015-02-12 SURGICAL SUPPLY — 46 items
BENZOIN TINCTURE PRP APPL 2/3 (GAUZE/BANDAGES/DRESSINGS) ×3 IMPLANT
BLADE SURG 15 STRL LF DISP TIS (BLADE) ×1 IMPLANT
BLADE SURG 15 STRL SS (BLADE) ×2
CANISTER SUCT 1200ML W/VALVE (MISCELLANEOUS) ×3 IMPLANT
CHLORAPREP W/TINT 26ML (MISCELLANEOUS) ×3 IMPLANT
CLOSURE WOUND 1/2 X4 (GAUZE/BANDAGES/DRESSINGS) ×1
DECANTER SPIKE VIAL GLASS SM (MISCELLANEOUS) ×3 IMPLANT
DRAIN PENROSE 5/8X12 LTX STRL (DRAIN) IMPLANT
DRAPE INCISE IOBAN 66X45 STRL (DRAPES) ×3 IMPLANT
DRAPE LAPAROTOMY TRNSV 106X77 (MISCELLANEOUS) ×3 IMPLANT
DRAPE SHEET LG 3/4 BI-LAMINATE (DRAPES) ×3 IMPLANT
DRAPE UTILITY 15X26 TOWEL STRL (DRAPES) ×6 IMPLANT
DRESSING TELFA 4X3 1S ST N-ADH (GAUZE/BANDAGES/DRESSINGS) ×3 IMPLANT
DRSG TEGADERM 4X4.75 (GAUZE/BANDAGES/DRESSINGS) ×3 IMPLANT
ELECT CAUTERY BLADE 6.4 (BLADE) ×3 IMPLANT
GAUZE SPONGE 4X4 12PLY STRL (GAUZE/BANDAGES/DRESSINGS) IMPLANT
GLOVE BIO SURGEON STRL SZ7.5 (GLOVE) ×9 IMPLANT
GOWN STRL REUS W/ TWL LRG LVL3 (GOWN DISPOSABLE) ×1 IMPLANT
GOWN STRL REUS W/ TWL XL LVL3 (GOWN DISPOSABLE) ×1 IMPLANT
GOWN STRL REUS W/TWL LRG LVL3 (GOWN DISPOSABLE) ×2
GOWN STRL REUS W/TWL XL LVL3 (GOWN DISPOSABLE) ×2
LABEL OR SOLS (LABEL) ×3 IMPLANT
MESH VENTRALEX ST 2.5 CRC MED (Mesh General) ×3 IMPLANT
NDL SAFETY 25GX1.5 (NEEDLE) ×3 IMPLANT
NEEDLE FILTER BLUNT 18X 1/2SAF (NEEDLE) ×2
NEEDLE FILTER BLUNT 18X1 1/2 (NEEDLE) ×1 IMPLANT
NS IRRIG 500ML POUR BTL (IV SOLUTION) ×3 IMPLANT
PACK BASIN MINOR ARMC (MISCELLANEOUS) ×3 IMPLANT
PAD GROUND ADULT SPLIT (MISCELLANEOUS) ×3 IMPLANT
STAPLER SKIN PROX 35W (STAPLE) IMPLANT
STRAP SAFETY BODY (MISCELLANEOUS) ×3 IMPLANT
STRIP CLOSURE SKIN 1/2X4 (GAUZE/BANDAGES/DRESSINGS) ×2 IMPLANT
SUT ETHIBOND 0 (SUTURE) ×9 IMPLANT
SUT ETHILON 4-0 (SUTURE) ×2
SUT ETHILON 4-0 FS2 18XMFL BLK (SUTURE) ×1
SUT SILK 2 0 SH (SUTURE) IMPLANT
SUT VIC AB 0 UR5 27 (SUTURE) IMPLANT
SUT VIC AB 2-0 SH 27 (SUTURE)
SUT VIC AB 2-0 SH 27XBRD (SUTURE) IMPLANT
SUT VIC AB 3-0 SH 27 (SUTURE) ×2
SUT VIC AB 3-0 SH 27X BRD (SUTURE) ×1 IMPLANT
SUT VIC AB 4-0 FS2 27 (SUTURE) ×3 IMPLANT
SUTURE ETHLN 4-0 FS2 18XMF BLK (SUTURE) ×1 IMPLANT
SWABSTK COMLB BENZOIN TINCTURE (MISCELLANEOUS) ×3 IMPLANT
SYR 3ML LL SCALE MARK (SYRINGE) ×3 IMPLANT
SYRINGE 10CC LL (SYRINGE) ×3 IMPLANT

## 2015-02-12 NOTE — Anesthesia Procedure Notes (Signed)
Performed by: Henrietta HooverPOPE, Emmamarie Kluender Pre-anesthesia Checklist: Patient identified, Emergency Drugs available, Suction available and Patient being monitored Patient Re-evaluated:Patient Re-evaluated prior to inductionOxygen Delivery Method: Circle system utilized Preoxygenation: Pre-oxygenation with 100% oxygen Intubation Type: IV induction Ventilation: Mask ventilation without difficulty Laryngoscope Size: Mac and 3 Grade View: Grade I Tube size: 7.0 mm Number of attempts: 1 Airway Equipment and Method: Stylet Placement Confirmation: ETT inserted through vocal cords under direct vision,  positive ETCO2 and breath sounds checked- equal and bilateral Secured at: 21 cm Tube secured with: Tape Dental Injury: Teeth and Oropharynx as per pre-operative assessment

## 2015-02-12 NOTE — H&P (Signed)
Date of Initial H&P: 01/31/2015  History reviewed, patient examined, no change in status, stable for surgery. 

## 2015-02-12 NOTE — Anesthesia Preprocedure Evaluation (Signed)
Anesthesia Evaluation  Patient identified by MRN, date of birth, ID band Patient awake    Reviewed: Allergy & Precautions, NPO status , Patient's Chart, lab work & pertinent test results  History of Anesthesia Complications (+) PONV  Airway Mallampati: II  TM Distance: >3 FB Neck ROM: Full    Dental   Some missing:   Pulmonary Current Smoker,    Pulmonary exam normal       Cardiovascular Exercise Tolerance: Good Normal cardiovascular examRhythm:Regular     Neuro/Psych    GI/Hepatic   Endo/Other    Renal/GU      Musculoskeletal   Abdominal (+)  Abdomen: soft.    Peds  Hematology   Anesthesia Other Findings   Reproductive/Obstetrics                             Anesthesia Physical Anesthesia Plan  ASA: II  Anesthesia Plan: General   Post-op Pain Management:    Induction: Intravenous  Airway Management Planned: Oral ETT  Additional Equipment:   Intra-op Plan:   Post-operative Plan: Extubation in OR  Informed Consent: I have reviewed the patients History and Physical, chart, labs and discussed the procedure including the risks, benefits and alternatives for the proposed anesthesia with the patient or authorized representative who has indicated his/her understanding and acceptance.     Plan Discussed with: CRNA  Anesthesia Plan Comments:         Anesthesia Quick Evaluation

## 2015-02-12 NOTE — Transfer of Care (Signed)
Immediate Anesthesia Transfer of Care Note  Patient: Laura MochaShannon Gohman  Procedure(s) Performed: Procedure(s): HERNIA REPAIR VENTRAL ADULT (N/A) INSERTION OF MESH (N/A)  Patient Location: PACU  Anesthesia Type:General  Level of Consciousness: sedated and patient cooperative  Airway & Oxygen Therapy: Patient Spontanous Breathing and Patient connected to face mask oxygen  Post-op Assessment: Report given to RN and Post -op Vital signs reviewed and stable  Post vital signs: Reviewed and stable  Last Vitals:  Filed Vitals:   02/12/15 1106  BP: 171/75  Pulse: 86  Temp:   Resp: 15    Complications: No apparent anesthesia complications

## 2015-02-12 NOTE — Anesthesia Postprocedure Evaluation (Signed)
  Anesthesia Post-op Note  Patient: Laura Small  Procedure(s) Performed: Procedure(s): HERNIA REPAIR VENTRAL ADULT (N/A) INSERTION OF MESH (N/A)  Anesthesia type:General  Patient location: PACU  Post pain: Pain level controlled  Post assessment: Post-op Vital signs reviewed, Patient's Cardiovascular Status Stable, Respiratory Function Stable, Patent Airway and No signs of Nausea or vomiting  Post vital signs: Reviewed and stable  Last Vitals:  Filed Vitals:   02/12/15 1136  BP: 140/62  Pulse: 71  Temp:   Resp: 13    Level of consciousness: awake, alert  and patient cooperative  Complications: No apparent anesthesia complications

## 2015-02-12 NOTE — Op Note (Signed)
02/12/2015  10:49 AM  PATIENT:  Laura Small  54 y.o. female  PRE-OPERATIVE DIAGNOSIS:  Epigastric Incisional Hernia  POST-OPERATIVE DIAGNOSIS:  Epigastric Incisional Hernia  PROCEDURE:  Procedure(s): HERNIA REPAIR VENTRAL ADULT (N/A) INSERTION OF MESH (N/A)  SURGEON:  Surgeon(s) and Role:     Natale LayMark Kiondre Grenz, MD FACS- Primary  ASSISTANTS: scrub tech   ANESTHESIA:  General  SPECIMEN:  None.  DICTATION: With the patient in supine position general endotracheal anesthesia was induced. Patient's abdomen was widely prepped with ChloraPrep solution followed by Laura Small. A timeout was observed.   A transversely oriented skin incision was fashioned overlying scar and hernia with scalpel and carried down with simultaneous tissues with electrocautery. The hernia sac was identified. The fascial defect was to the right of the midline measuring 1 cm x 1 cm. The hernia sac was dissected off the preperitoneal space creating a preperitoneal pocket. A medium ventralex type 6.4 cm patch was brought onto the field and secured at 4 points utilizing Ethibond suture. The fascia was then easily reapproximated in transverse orientation utilizing interrupted figure-of-eight #0 Ethibond suture. A total of 20 cc of 0.25% plain Marcaine was infiltrated along the operative field. 3-0 Vicryl was used to reapproximate subcutaneous tissues followed by 4-0 Vicryl subcuticular in the skin and an additional 4-0 nylon.   Steri-Strips Telfa and Tegaderm were applied patient wasn't subsequently taken to the recovery room in stable and satisfactory condition by anesthesia services.

## 2015-02-12 NOTE — Discharge Instructions (Addendum)
DISCHARGE INSTRUCTIONS TO PATIENT  REMINDER:   Carry a list of your medications and allergies with you at all times  Call your pharmacy at least 1 week in advance to refill prescriptions  Do not mix any prescribed pain medicine with alcohol  Do not drive any motor vehicles while taking pain medication.  Take medications with food.  Do not retake a pain medication if you vomit after taking it.  Activity: no lifting more than 15 pounds until instructed by your doctor.   Dressing Care Instruction (if applicable):   May Shower-  Call office if any questions regarding this activity.  Dry Dressing as needed to operative site.  Drain care instructions provided to you in the hospital.   Follow-up appointments (date to return to physician): Call for appointment with Dr. Sherri Rad, MD at [phone] or [phone]  If need MD on call after hours and on weekends call Hospital operator at [phone] as ask to speak to Surgeon on call for [facility].  Call Surgeon if you have:  Temperature greater than 100.4  Persistent nausea and vomiting  Severe uncontrolled pain  Redness, tenderness, or signs of infection (pain, swelling, redness, odor or green/yellow discharge around the site)  Difficulty breathing, headache or visual disturbances  Hives  Persistent dizziness or light-headedness  Extreme fatigue  Any other questions or concerns you may have after discharge  In an emergency, call 911 or go to an Emergency Department at a nearby hospital  Diet:  Resume your usual diet.  Avoid spicy, greasy or heavy foods.  If you have nausea or vomiting, go back to liquids.  If you cannot keep liquids down, call your doctor.  Avoid alcohol consumption while on prescription pain medications. Good nutrition promotes healing. Increase fiber and fluids.     I understand and acknowledge receipt of the above instructions.                                                                                      Patient or Guardian Signature                                                                    Date/Time  Physician's or R.N.'s Signature                                                                  Date/Time  The discharge instructions have been reviewed with the patient and/or Family Member/Parent/Guardian.  Patient and/or Family Member/Parent/Guardian signed and retained a printed copy.

## 2015-02-13 ENCOUNTER — Encounter: Payer: Self-pay | Admitting: Surgery

## 2015-02-15 DIAGNOSIS — Z8719 Personal history of other diseases of the digestive system: Secondary | ICD-10-CM | POA: Insufficient documentation

## 2015-02-15 DIAGNOSIS — M549 Dorsalgia, unspecified: Secondary | ICD-10-CM | POA: Insufficient documentation

## 2015-02-15 DIAGNOSIS — K810 Acute cholecystitis: Secondary | ICD-10-CM | POA: Insufficient documentation

## 2015-02-15 DIAGNOSIS — K469 Unspecified abdominal hernia without obstruction or gangrene: Secondary | ICD-10-CM | POA: Insufficient documentation

## 2015-02-22 ENCOUNTER — Ambulatory Visit: Payer: Self-pay | Admitting: Surgery

## 2015-02-22 ENCOUNTER — Telehealth: Payer: Self-pay | Admitting: Surgery

## 2015-02-22 ENCOUNTER — Other Ambulatory Visit: Payer: Self-pay | Admitting: *Deleted

## 2015-02-22 MED ORDER — OXYCODONE-ACETAMINOPHEN 5-325 MG PO TABS: 1.0000 | ORAL_TABLET | Freq: Four times a day (QID) | ORAL | Status: DC | PRN

## 2015-02-22 NOTE — Telephone Encounter (Signed)
Called patient and informed her that a [facility] for [facility] [facility]. Pt. Confirmed understanding of information.

## 2015-02-22 NOTE — Telephone Encounter (Signed)
Patient had post op appt with Dr Egbert GaribaldiBird this week but he had to cancel clinic - she is rescheduled for 6/8, but needs a refill on her pain meds before that date as she will run out tomorrow

## 2015-02-28 ENCOUNTER — Encounter: Payer: Self-pay | Admitting: Surgery

## 2015-02-28 ENCOUNTER — Ambulatory Visit (INDEPENDENT_AMBULATORY_CARE_PROVIDER_SITE_OTHER): Payer: Self-pay | Admitting: Surgery

## 2015-02-28 VITALS — BP 144/91 | HR 74 | Temp 98.0°F | Ht 67.0 in | Wt 222.6 lb

## 2015-02-28 DIAGNOSIS — K439 Ventral hernia without obstruction or gangrene: Secondary | ICD-10-CM

## 2015-02-28 NOTE — Patient Instructions (Signed)
Call us if you need us

## 2015-02-28 NOTE — Progress Notes (Signed)
Outpatient Surgical Follow Up  02/28/2015  Laura Small is an 54 y.o. female.   Chief Complaint  Patient presents with  . Routine Post Op    epigastic hernia repair done on 02/12/15 by Dr. Egbert GaribaldiBird     HPI: She returns following her ventral hernia repair. She does not have any complaints at the present time. She has several questions regarding her activity level but overall is doing quite well. She denies any GI or GU symptoms.  Past Medical History  Diagnosis Date  . PONV (postoperative nausea and vomiting)     usually has 1 episode of vomiting within 1 hour of anesthesia     Past Surgical History  Procedure Laterality Date  . Cholecystectomy    . Back surgery    . Ventral hernia repair N/A 02/12/2015    Procedure: HERNIA REPAIR VENTRAL ADULT;  Surgeon: Natale LayMark Bird, MD;  Location: [facility] ORS;  Service: General;  Laterality: N/A;  . Insertion of mesh N/A 02/12/2015    Procedure: INSERTION OF MESH;  Surgeon: Natale LayMark Bird, MD;  Location: [facility] ORS;  Service: General;  Laterality: N/A;    Family History  Problem Relation Age of Onset  . Colon cancer Neg Hx     Social History:  reports that she has been smoking Cigarettes.  She has never used smokeless tobacco. She reports that she does not drink alcohol or use illicit drugs.  Allergies:  Allergies  Allergen Reactions  . Bee Venom Anaphylaxis and Swelling    Medications reviewed.    ROS no new complaints.    BP 144/91 mmHg  Pulse 74  Temp(Src) 98 F (36.7 C) (Oral)  Ht 5\' 7"  (1.702 m)  Wt 222 lb 9.6 oz (100.971 kg)  BMI 34.86 kg/m2  Physical Exam her wound looks good. There is no sign of any infection. Steri-Strips were applied. She does not have any evidence of recurrence or persistence of her hernia.     No results found for this or any previous visit (from the past 48 hour(s)). No results found.  Assessment/Plan:  1. Ventral hernia without obstruction or gangrene She is doing well post surgery. We liberalized  her activity and discussed further increase over the next month. We will plan to see her back in our office as necessary. She is in agreement.     Tiney Rougealph Ely III  02/28/2015,negative

## 2015-03-30 ENCOUNTER — Emergency Department: Payer: Self-pay

## 2015-03-30 ENCOUNTER — Emergency Department
Admission: EM | Admit: 2015-03-30 | Discharge: 2015-03-30 | Disposition: A | Discharge status: Home / Self Care | Payer: Self-pay | Attending: Emergency Medicine | Admitting: Emergency Medicine

## 2015-03-30 ENCOUNTER — Encounter: Payer: Self-pay | Admitting: Emergency Medicine

## 2015-03-30 DIAGNOSIS — R1033 Periumbilical pain: Secondary | ICD-10-CM | POA: Insufficient documentation

## 2015-03-30 DIAGNOSIS — Z720 Tobacco use: Secondary | ICD-10-CM | POA: Insufficient documentation

## 2015-03-30 LAB — COMPREHENSIVE METABOLIC PANEL
ALT: 18 U/L (ref 14–54)
ANION GAP: 12 (ref 5–15)
AST: 33 U/L (ref 15–41)
Albumin: 4 g/dL (ref 3.5–5.0)
Alkaline Phosphatase: 112 U/L (ref 38–126)
BILIRUBIN TOTAL: 0.6 mg/dL (ref 0.3–1.2)
BUN: 12 mg/dL (ref 6–20)
CALCIUM: 9.4 mg/dL (ref 8.9–10.3)
CO2: 18 mmol/L — AB (ref 22–32)
CREATININE: 0.67 mg/dL (ref 0.44–1.00)
Chloride: 106 mmol/L (ref 101–111)
GFR calc non Af Amer: >60 mL/min (ref >60)
Glucose, Bld: 170 mg/dL — ABNORMAL HIGH (ref 65–99)
Potassium: 4 mmol/L (ref 3.5–5.1)
Sodium: 136 mmol/L (ref 135–145)
Total Protein: 7.8 g/dL (ref 6.5–8.1)

## 2015-03-30 LAB — CBC WITH DIFFERENTIAL/PLATELET
BASOS PCT: 1 %
Basophils Absolute: 0.1 10*3/uL (ref 0–0.1)
Eosinophils Absolute: 0 10*3/uL (ref 0–0.7)
Eosinophils Relative: 0 %
HCT: 44.8 % (ref 35.0–47.0)
Hemoglobin: 14.9 g/dL (ref 12.0–16.0)
LYMPHS PCT: 23 %
Lymphs Abs: 3 10*3/uL (ref 1.0–3.6)
MCH: 28 pg (ref 26.0–34.0)
MCHC: 33.2 g/dL (ref 32.0–36.0)
MCV: 84.5 fL (ref 80.0–100.0)
Monocytes Absolute: 1 10*3/uL — ABNORMAL HIGH (ref 0.2–0.9)
Monocytes Relative: 7 %
NEUTROS ABS: 9.4 10*3/uL — AB (ref 1.4–6.5)
Neutrophils Relative %: 69 %
Platelets: 322 10*3/uL (ref 150–440)
RBC: 5.3 MIL/uL — ABNORMAL HIGH (ref 3.80–5.20)
RDW: 14.5 % (ref 11.5–14.5)
WBC: 13.5 10*3/uL — AB (ref 3.6–11.0)

## 2015-03-30 MED ORDER — ONDANSETRON HCL 4 MG/2ML IJ SOLN
4.0000 mg | Freq: Once | INTRAMUSCULAR | Status: AC
  Administered 2015-03-30: 4 mg via INTRAVENOUS

## 2015-03-30 MED ORDER — MORPHINE SULFATE 4 MG/ML IJ SOLN
INTRAMUSCULAR | Status: AC
  Administered 2015-03-30: 4 mg via INTRAVENOUS
  Filled 2015-03-30: qty 1

## 2015-03-30 MED ORDER — OXYCODONE-ACETAMINOPHEN 5-325 MG PO TABS: 1.0000 | ORAL_TABLET | ORAL | Status: DC | PRN

## 2015-03-30 MED ORDER — MORPHINE SULFATE 4 MG/ML IJ SOLN
4.0000 mg | Freq: Once | INTRAMUSCULAR | Status: AC
  Administered 2015-03-30: 4 mg via INTRAVENOUS

## 2015-03-30 MED ORDER — IOHEXOL 300 MG/ML  SOLN
100.0000 mL | Freq: Once | INTRAMUSCULAR | Status: AC | PRN
  Administered 2015-03-30: 100 mL via INTRAVENOUS

## 2015-03-30 MED ORDER — CLINDAMYCIN HCL 300 MG PO CAPS: 300.0000 mg | ORAL_CAPSULE | Freq: Two times a day (BID) | ORAL | Status: DC

## 2015-03-30 MED ORDER — ONDANSETRON HCL 4 MG/2ML IJ SOLN
INTRAMUSCULAR | Status: AC
  Administered 2015-03-30: 4 mg via INTRAVENOUS
  Filled 2015-03-30: qty 2

## 2015-03-30 MED ORDER — CEPHALEXIN 500 MG PO CAPS: 500.0000 mg | ORAL_CAPSULE | Freq: Three times a day (TID) | ORAL | Status: DC

## 2015-03-30 MED ORDER — IOHEXOL 240 MG/ML SOLN: 25.0000 mL | Freq: Once | INTRAMUSCULAR | Status: DC | PRN

## 2015-03-30 MED ORDER — IOHEXOL 240 MG/ML SOLN
50.0000 mL | INTRAMUSCULAR | Status: AC
  Administered 2015-03-30: 50 mL via ORAL

## 2015-03-30 NOTE — Progress Notes (Signed)
Laura Small is a 54 y.o. female  Seen in emergency room with sudden onset of abdominal pain proximate 6 weeks after an epigastric hernia repair  HPI: She complains of sudden onset of pain in her upper abdomen and the site of her previous epigastric hernia repair. She's approximate 60 weeks out from her hernia repair performed by one of my associates. Her postop follow-up visit was unremarkable. She had mesh placed in the defect. She has no vomiting although she has been slightly nauseated.  Workup emergency room revealed some fluid around the mesh consistent with a postoperative seroma. She has slightly elevated white blood cell count. She also has thickened small bowel sparing the terminal ileum similar to her previous study suggesting possible inflammatory bowel disease. She has had no workup of that particular problem at this time. Surgical service was consulted to evaluate for postoperative problems.  Past Medical History  Diagnosis Date  . PONV (postoperative nausea and vomiting)     usually has 1 episode of vomiting within 1 hour of anesthesia    Past Surgical History  Procedure Laterality Date  . Cholecystectomy    . Back surgery    . Ventral hernia repair N/A 02/12/2015    Procedure: HERNIA REPAIR VENTRAL ADULT;  Surgeon: Natale LayMark Bird, MD;  Location: [facility] ORS;  Service: General;  Laterality: N/A;  . Insertion of mesh N/A 02/12/2015    Procedure: INSERTION OF MESH;  Surgeon: Natale LayMark Bird, MD;  Location: [facility] ORS;  Service: General;  Laterality: N/A;   History   Social History  . Marital Status: Divorced    Spouse Name: N/A  . Number of Children: N/A  . Years of Education: N/A   Social History Main Topics  . Smoking status: Current Every Day Smoker    Types: Cigarettes  . Smokeless tobacco: Never Used  . Alcohol Use: No  . Drug Use: No  . Sexual Activity: Not on file   Other Topics Concern  . None   Social History Narrative    Review of Systems: ROS 10 point review of  systems was carried out and is otherwise unremarkable.  PHYSICAL EXAM: BP 131/79 mmHg  Pulse 71  Temp(Src) 97.8 F (36.6 C) (Oral)  Resp 16  Ht 5\' 7"  (1.702 m)  Wt 100.699 kg (222 lb)  BMI 34.76 kg/m2  SpO2 97%  Physical Exam  Constitutional: She is oriented to person, place, and time. She appears well-developed and well-nourished.  HENT:  Head: Normocephalic and atraumatic.  Eyes: EOM are normal. Pupils are equal, round, and reactive to light.  Neck: Normal range of motion. Neck supple.  Cardiovascular: Normal rate and normal heart sounds.   Pulmonary/Chest: Effort normal and breath sounds normal.  Abdominal: Soft. Bowel sounds are normal.  Musculoskeletal: Normal range of motion.  Neurological: She is alert and oriented to person, place, and time.  Skin: Skin is warm and dry.  Psychiatric: She has a normal mood and affect. Her behavior is normal.   There is no significant skin change around the incisional site. There is no erythema or discoloration. She has no mass. She is tender in that area.  Impression/Plan: I have independently reviewed her CT scan. She does have fluid collection around the mesh but it is quite small and suggestive postoperative seroma. However, with her elevated white blood cell count and those findings in her small bowel we will initiate antibiotic therapy and follow her up in the office early next week. We offered her observation pain medicine  and antibiotic therapy in the hospital but she declined. We'll be available to see her in the office for follow-up. She was coming by her brother and they are in agreement.   Tiney Rouge III, MD  03/30/2015, 10:08 AM

## 2015-03-30 NOTE — ED Provider Notes (Signed)
[facility]  I accepted care from Dr. Derrill KayGoodman ____________________________________________    LABS (pertinent positives/negatives)  See previous note   ____________________________________________    RADIOLOGY All xrays were viewed by me. Imaging interpreted by radiologist.  CT abdomen and pelvis with contrast:   IMPRESSION: 1. Interval supraumbilical hernia repair. There is a small nonspecific fluid collection within the subcutaneous fat at the surgical incision. No evidence of recurrent hernia. 2. Persistent but fluctuating ileal wall thickening sparing the terminal ileum. Similar findings were demonstrated on the patient's prior CTs. Therefore, this is unlikely infectious and suggests inflammatory bowel disease, vasculitis or possibly celiac disease. Correlate clinically.  ____________________________________________   PROCEDURES  Procedure(s) performed: None  Critical Care performed: None  ____________________________________________   INITIAL IMPRESSION / ASSESSMENT AND PLAN / ED COURSE  CONSULTATIONS: Face-to-face with general surgeon Dr. Michela PitcherEly in the ED  Pertinent labs & imaging results that were available during my care of the patient were reviewed by me and considered in my medical decision making (see chart for details).  Upon CT scan read, I discussed the findings with the patient and family. I discussed with Dr. Michela PitcherEly, who saw the patient and then talk to me about his plan to place her on Keflex, Cleocin, and pain medication. She will call to make a follow-up appointment on Monday. Patient / Family / Caregiver informed of clinical course, medical decision-making process, and agree with plan.   I discussed return precautions, follow-up instructions, and discharged instructions with patient and/or family.  ____________________________________________   FINAL CLINICAL IMPRESSION(S) / ED DIAGNOSES  Final diagnoses:  Periumbilical  abdominal pain     FOLLOW UP   Referred to: Dr. Michela PitcherEly, in the office   Governor Rooksebecca Antoniette Peake, MD 03/30/15 1031

## 2015-03-30 NOTE — ED Provider Notes (Signed)
[facility] Emergency Department Provider Note    ____________________________________________  Time seen: [id number]  I have reviewed the triage vital signs and the nursing notes.   HISTORY  Chief Complaint No chief complaint on file.   History limited by: Not Limited   HPI Laura Small is a 54 y.o. female who presents to the emergency department with abdominal pain that started suddenly tonight. She states it is located under her incision scar from her ventral hernia repair performed a month and a half ago. The patient states it feels like there is something in there. She has not had any vomiting, although she has had some nausea. No recent change in bowel output. No fevers.    Past Medical History  Diagnosis Date  . PONV (postoperative nausea and vomiting)     usually has 1 episode of vomiting within 1 hour of anesthesia     Patient Active Problem List   Diagnosis Date Noted  . Acute cholecystitis 02/15/2015  . Abdominal hernia 02/15/2015  . Back ache 02/15/2015  . 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 Cholecystectomy    . Back surgery    . Ventral hernia repair N/A 02/12/2015    Procedure: HERNIA REPAIR VENTRAL ADULT;  Surgeon: Natale LayMark Bird, MD;  Location: [facility] ORS;  Service: General;  Laterality: N/A;  . Insertion of mesh N/A 02/12/2015    Procedure: INSERTION OF MESH;  Surgeon: Natale LayMark Bird, MD;  Location: [facility] ORS;  Service: General;  Laterality: N/A;    No current outpatient prescriptions on file.  Allergies Bee venom  Family History  Problem Relation Age of Onset  . Colon cancer Neg Hx     Social History History  Substance Use Topics  . Smoking status: Current Every Day Smoker    Types: Cigarettes  . Smokeless tobacco: Never Used  . Alcohol Use: No    Review of Systems  Constitutional: Negative for fever. Cardiovascular: Negative for chest pain. Respiratory: Negative  for shortness of breath. Gastrointestinal: Positive for abdominal pain. Genitourinary: Negative for dysuria. Musculoskeletal: Negative for back pain. Skin: Negative for rash. Neurological: Negative for headaches, focal weakness or numbness.  10-point ROS otherwise negative.  ____________________________________________   PHYSICAL EXAM:  VITAL SIGNS:  97.8 F (36.6 C)  94  20   159/89 mmHg  97 %   Constitutional: Alert and oriented. Well appearing and in no distress. Eyes: Conjunctivae are normal. PERRL. Normal extraocular movements. ENT   Head: Normocephalic and atraumatic.   Nose: No congestion/rhinnorhea.   Mouth/Throat: Mucous membranes are moist.   Neck: No stridor. Hematological/Lymphatic/Immunilogical: No cervical lymphadenopathy. Cardiovascular: Normal rate, regular rhythm.  No murmurs, rubs, or gallops. Respiratory: Normal respiratory effort without tachypnea nor retractions. Breath sounds are clear and equal bilaterally. No wheezes/rales/rhonchi. Gastrointestinal: Soft. Tender to palpation over abdomen, particularly around incision site. No hernia or bulging appreciated.  Genitourinary: Deferred Musculoskeletal: Normal range of motion in all extremities. No joint effusions.  No lower extremity tenderness nor edema. Neurologic:  Normal speech and language. No gross focal neurologic deficits are appreciated. Speech is normal.  Skin:  Skin is warm, dry and intact. No rash noted. Psychiatric: Mood and affect are normal. Speech and behavior are normal. Patient exhibits appropriate insight and judgment.  ____________________________________________    LABS (pertinent positives/negatives)  Labs Reviewed  CBC WITH DIFFERENTIAL/PLATELET - Abnormal; Notable for the following:    WBC 13.5 (*)    RBC 5.30 (*)  Neutro Abs 9.4 (*)    Monocytes Absolute 1.0 (*)    All other components within normal limits  COMPREHENSIVE METABOLIC PANEL - Abnormal; Notable for  the following:    CO2 18 (*)    Glucose, Bld 170 (*)    All other components within normal limits     ____________________________________________   EKG  None  ____________________________________________    RADIOLOGY  CT abd/pel pending  ____________________________________________   PROCEDURES  Procedure(s) performed: None  Critical Care performed: No  ____________________________________________   INITIAL IMPRESSION / ASSESSMENT AND PLAN / ED COURSE  Pertinent labs & imaging results that were available during my care of the patient were reviewed by me and considered in my medical decision making (see chart for details).  Patient presents with abdominal pain. Pain is located around the site of her ventral hernia repair. Patient does have a leukocytosis. Due to concern for recurrent hernia versus SBO versus infection will obtain CT abdomen and pelvis.  ____________________________________________   FINAL CLINICAL IMPRESSION(S) / ED DIAGNOSES  Abdominal pain  Phineas Semen, MD 03/30/15 [phone]

## 2015-03-30 NOTE — ED Notes (Signed)
CT aware patient finished contrast

## 2015-03-30 NOTE — ED Notes (Signed)
Pt to ED c/o mid abd pain. Pt states pain is where she previously had a hernia repair surgery.

## 2015-03-30 NOTE — ED Notes (Signed)
Returned from CT.

## 2015-03-30 NOTE — Discharge Instructions (Signed)
You're being started on antibiotics for possible infection. Return to the emergency department for any fever, new or worsening abdominal pain, vomiting, or any other symptoms concerning to you. [facility]. Call to make an appointment.  Inguinal Hernia, Adult  Care After  Refer to this sheet in the next few weeks. These discharge instructions provide you with general information on caring for yourself after you leave the hospital. Your caregiver may also give you specific instructions. Your treatment has been planned according to the most current medical practices available, but unavoidable complications sometimes occur. If you have any problems or questions after discharge, please call your caregiver.  HOME CARE INSTRUCTIONS  Change bandages (dressings) as directed.  Keep the wound dry and clean. The wound may be washed gently with soap and water. Gently blot or dab the wound dry. It is okay to take showers 24 to 48 hours after surgery. Do not take baths, use swimming pools, or use hot tubs for 10 days, or as directed by your caregiver.  Only take over-the-counter or prescription medicines for pain, discomfort, or fever as directed by your caregiver.  Continue your normal diet as directed.  Do not lift anything more than 10 pounds or play contact sports for 3 weeks, or as directed. SEEK MEDICAL CARE IF:  There is redness, swelling, or increasing pain in the wound.  There is fluid (pus) coming from the wound.  There is drainage from a wound lasting longer than 1 day.  You have an oral temperature above 102 F (38.9 C).  You notice a bad smell coming from the wound or dressing.  The wound breaks open after the stitches (sutures) have been removed.  You notice increasing pain in the shoulders (shoulder strap areas).  You develop dizzy episodes or fainting while standing.  You feel sick to your stomach (nauseous) or throw up (vomit). SEEK IMMEDIATE MEDICAL CARE IF:  You  have difficulty breathing.  You develop a reaction or have side effects to medicines you were given. MAKE SURE YOU:  Understand these instructions.  Will watch your condition.  Will get help right away if you are not doing well or get worse.

## 2015-04-04 ENCOUNTER — Ambulatory Visit (INDEPENDENT_AMBULATORY_CARE_PROVIDER_SITE_OTHER): Payer: Self-pay | Admitting: Surgery

## 2015-04-04 ENCOUNTER — Encounter: Payer: Self-pay | Admitting: Surgery

## 2015-04-04 VITALS — BP 133/82 | HR 69 | Temp 98.3°F | Ht 67.0 in | Wt 225.0 lb

## 2015-04-04 DIAGNOSIS — R1013 Epigastric pain: Secondary | ICD-10-CM

## 2015-04-04 MED ORDER — DIAZEPAM 5 MG PO TABS: 5.0000 mg | ORAL_TABLET | Freq: Three times a day (TID) | ORAL | Status: DC | PRN

## 2015-04-04 MED ORDER — IBUPROFEN 600 MG PO TABS: 600.0000 mg | ORAL_TABLET | Freq: Three times a day (TID) | ORAL | Status: DC

## 2015-04-04 MED ORDER — OXYCODONE-ACETAMINOPHEN 5-325 MG PO TABS: 1.0000 | ORAL_TABLET | ORAL | Status: DC | PRN

## 2015-04-04 NOTE — Progress Notes (Signed)
Surgery Progress Note  S: C/o persistent epigastric pain.  Mildly better than at prior er visit.  No bulge.  No fevers.   Blood pressure 133/82, pulse 69, temperature 98.3 F (36.8 C), temperature source Oral, height 5\' 7"  (1.702 m), weight 102.059 kg (225 lb). GEN: NAD/appears uncomfortable ABD: soft, mild tender over epigastrium, nondistended, no obvious palpable mass over epigastric hernia repair incision, no erythema/induration  A/P 54 yo s/p epigastric hernia repair, new onset epigastric pain x 1 week.  CT shows small fluid collection around hernia but no obvious recurrent hernia - refill percocet, begin nsaids and valium for ? Muscle spasm - check lipase, ensure not pancreatitis (other LFT normal at recent ER visit) - f/u in 1 week with Dr. Egbert GaribaldiBird

## 2015-04-04 NOTE — Patient Instructions (Signed)
Do not drive on pain medications °Call or return to ER if you develop fever greater than 101.5, nausea/vomiting, increased pain, redness/drainage from incisions ° ° °

## 2015-04-11 ENCOUNTER — Ambulatory Visit: Payer: Self-pay | Admitting: Surgery

## 2015-04-23 ENCOUNTER — Telehealth: Payer: Self-pay | Admitting: Surgery

## 2015-04-23 NOTE — Telephone Encounter (Signed)
Called patient at this time to let her know that she will need to be seen in the office prior these medications being refilled.  No answer. Left Voicemail stating that she would need an appointment prior to refilling medications.  Will await return phone call.

## 2015-04-23 NOTE — Telephone Encounter (Signed)
Spoke with patient at this time. She states that epigastric pain is not new from last appointment but is becoming more severe. Denies SOB, other Chest pain, and diaphoresis at this time.  Informed her that she would need to have Lipase drawn prior to appointment as ordered at 04/04/15 appointment.  Offered multiple appointments with different surgeons, she would like to schedule this appointment on Friday 04/27/15 as this is when her husband's money comes in.  Informed her that we could bill her for appointment if she needs to see a surgeon and we can see her tomorrow. She would like to continue with appointment on Friday.  Appointment made for 04/27/15 with Dr. Juliann Pulse in [place] office. She read back appointment details at this time.

## 2015-04-23 NOTE — Telephone Encounter (Signed)
Refill [facility]  Ibuprofen Diazepam oxycodone

## 2015-04-27 ENCOUNTER — Ambulatory Visit (INDEPENDENT_AMBULATORY_CARE_PROVIDER_SITE_OTHER): Payer: Self-pay | Admitting: Surgery

## 2015-04-27 ENCOUNTER — Other Ambulatory Visit
Admission: RE | Admit: 2015-04-27 | Discharge: 2015-04-27 | Disposition: A | Discharge status: Home / Self Care | Payer: Self-pay | Source: Ambulatory Visit | Attending: Surgery | Admitting: Surgery

## 2015-04-27 ENCOUNTER — Encounter: Payer: Self-pay | Admitting: Surgery

## 2015-04-27 VITALS — BP 153/80 | HR 85 | Temp 97.5°F | Ht 67.0 in | Wt 216.0 lb

## 2015-04-27 DIAGNOSIS — R1084 Generalized abdominal pain: Secondary | ICD-10-CM | POA: Insufficient documentation

## 2015-04-27 LAB — CBC WITH DIFFERENTIAL/PLATELET
Basophils Absolute: 0.1 10*3/uL (ref 0–0.1)
Basophils Relative: 1 %
EOS PCT: 0 %
Eosinophils Absolute: 0 10*3/uL (ref 0–0.7)
HCT: 43.7 % (ref 35.0–47.0)
Hemoglobin: 14.7 g/dL (ref 12.0–16.0)
LYMPHS ABS: 2.8 10*3/uL (ref 1.0–3.6)
Lymphocytes Relative: 34 %
MCH: 28.3 pg (ref 26.0–34.0)
MCHC: 33.7 g/dL (ref 32.0–36.0)
MCV: 83.9 fL (ref 80.0–100.0)
MONO ABS: 0.6 10*3/uL (ref 0.2–0.9)
Monocytes Relative: 7 %
NEUTROS PCT: 58 %
Neutro Abs: 4.8 10*3/uL (ref 1.4–6.5)
PLATELETS: 326 10*3/uL (ref 150–440)
RBC: 5.21 MIL/uL — AB (ref 3.80–5.20)
RDW: 15.1 % — ABNORMAL HIGH (ref 11.5–14.5)
WBC: 8.3 10*3/uL (ref 3.6–11.0)

## 2015-04-27 LAB — COMPREHENSIVE METABOLIC PANEL
ALT: 23 U/L (ref 14–54)
ANION GAP: 8 (ref 5–15)
AST: 22 U/L (ref 15–41)
Albumin: 3.8 g/dL (ref 3.5–5.0)
Alkaline Phosphatase: 110 U/L (ref 38–126)
BUN: 6 mg/dL (ref 6–20)
CO2: 24 mmol/L (ref 22–32)
CREATININE: 0.59 mg/dL (ref 0.44–1.00)
Calcium: 9.4 mg/dL (ref 8.9–10.3)
Chloride: 105 mmol/L (ref 101–111)
GFR calc non Af Amer: >60 mL/min (ref >60)
GLUCOSE: 119 mg/dL — AB (ref 65–99)
Potassium: 3.6 mmol/L (ref 3.5–5.1)
SODIUM: 137 mmol/L (ref 135–145)
Total Bilirubin: 0.2 mg/dL — ABNORMAL LOW (ref 0.3–1.2)
Total Protein: 7.5 g/dL (ref 6.5–8.1)

## 2015-04-27 MED ORDER — OXYCODONE-ACETAMINOPHEN 5-325 MG PO TABS: 1.0000 | ORAL_TABLET | Freq: Four times a day (QID) | ORAL | Status: DC | PRN

## 2015-04-27 NOTE — Patient Instructions (Addendum)
We will see you in a week. Do not drive on pain medications Call or return to ER if you develop fever greater than 101.5, nausea/vomiting, increased pain, redness/drainage from incisions

## 2015-04-27 NOTE — Progress Notes (Signed)
Surgery Progress Note  S: Still complains of epigastric pain.  Says that she feels the "edges of the mesh" tearing as she moves.  Also complains of "dry diarrhea" which she reports is due to sensation of needing to have BM after having bowels evacuation.  Reports that diazepam for cramps knocks her out.  Was requesting something else for muscle spasms.  No fevers/chills, night sweats, shortness of breath, chest pain, dysuria/hematuria.  Blood pressure 153/80, pulse 85, temperature 97.5 F (36.4 C), temperature source Oral, height  (1.702 m), weight 97.977 kg (216 lb). GEN: NAD/A&Ox3 ABD: soft, nontender, nondistended, no recurrent hernia  A/P 54 yo s/p epigastric hernia repair with persistent pain.  I have reviewed previous CT and am relatively unimpressed by ileal thickening. Will obtain labs to ensure no indication for CT scan. F/u with Dr. Egbert Garibaldi in 1 week for results.

## 2015-05-01 ENCOUNTER — Encounter: Payer: Self-pay | Admitting: Surgery

## 2015-05-01 ENCOUNTER — Ambulatory Visit (INDEPENDENT_AMBULATORY_CARE_PROVIDER_SITE_OTHER): Payer: Self-pay | Admitting: Surgery

## 2015-05-01 VITALS — BP 143/83 | HR 80 | Temp 98.1°F | Ht 64.0 in | Wt 221.0 lb

## 2015-05-01 DIAGNOSIS — K439 Ventral hernia without obstruction or gangrene: Secondary | ICD-10-CM | POA: Insufficient documentation

## 2015-05-01 NOTE — Patient Instructions (Signed)
Give us a call if you have any questions or concerns. 

## 2015-05-01 NOTE — Progress Notes (Signed)
Surgery clinic  This is a 54 year old white female who underwent a open ventral hernia repair with mesh in May of this year. Since surgery she has had point tenderness around the incision. A CT scan obtained in July of this year demonstrated no evidence of recurrent hernia. She was seen last week by one of my associates and placed on ibuprofen and Valium. CBC and labs were obtained found to be unremarkable. The patient denies any fever or drainage from the wound. She does not feel that she has a recurrent hernia present.  On physical examination she points to the transverse scar just to the left of the midline with point tenderness in this area. I cannot appreciate a recurrent hernia. The wound is well-healed there isevidence of seroma or signs of infection.  Impression possible neuroma in the area of the wound.  Plan: The patient will be started on 500 mg per day of oral Etodolac.  An injection consisting of lidocaine with epinephrine and 5 mg of Kenalog was instilled sterilely into the area of the trigger point. I will see her back in 1 week's time.

## 2015-05-02 ENCOUNTER — Ambulatory Visit: Payer: Self-pay | Admitting: Surgery

## 2015-05-03 ENCOUNTER — Telehealth: Payer: Self-pay | Admitting: Surgery

## 2015-05-03 NOTE — Telephone Encounter (Signed)
Call returned to patient at this time after speaking with Dr. Egbert Garibaldi.  Explained that if she has pain medication at home she may use this as needed.  When asked, patient states that the injection has helped but feels that she needs some pain medicine to help her in addition to this. She would like to have a discuss regarding pain management when she returns to clinic.  We will see patient back in office on 05/14/15.

## 2015-05-03 NOTE — Telephone Encounter (Signed)
Patient would like to speak with the nurse about the medication Dr Egbert Garibaldi prescribed for her (Etodolac) She states it is not helping and would like to know if it would be okay to take a single dose of Oxycodone as she has some left over. Please call and advise.

## 2015-05-14 ENCOUNTER — Ambulatory Visit: Payer: Self-pay | Admitting: Surgery

## 2016-04-03 ENCOUNTER — Encounter: Payer: Self-pay | Admitting: Adult Health

## 2016-04-03 ENCOUNTER — Emergency Department: Payer: Self-pay

## 2016-04-03 ENCOUNTER — Emergency Department
Admission: EM | Admit: 2016-04-03 | Discharge: 2016-04-03 | Disposition: A | Discharge status: Home / Self Care | Payer: Self-pay | Attending: Student | Admitting: Student

## 2016-04-03 DIAGNOSIS — F17210 Nicotine dependence, cigarettes, uncomplicated: Secondary | ICD-10-CM | POA: Insufficient documentation

## 2016-04-03 DIAGNOSIS — R079 Chest pain, unspecified: Secondary | ICD-10-CM

## 2016-04-03 DIAGNOSIS — R0602 Shortness of breath: Secondary | ICD-10-CM | POA: Insufficient documentation

## 2016-04-03 LAB — BASIC METABOLIC PANEL
ANION GAP: 11 (ref 5–15)
BUN: 10 mg/dL (ref 6–20)
CALCIUM: 9.4 mg/dL (ref 8.9–10.3)
CO2: 18 mmol/L — AB (ref 22–32)
CREATININE: 0.89 mg/dL (ref 0.44–1.00)
Chloride: 109 mmol/L (ref 101–111)
Glucose, Bld: 161 mg/dL — ABNORMAL HIGH (ref 65–99)
Potassium: 3.7 mmol/L (ref 3.5–5.1)
SODIUM: 138 mmol/L (ref 135–145)

## 2016-04-03 LAB — CBC
HCT: 46.9 % (ref 35.0–47.0)
Hemoglobin: 15.8 g/dL (ref 12.0–16.0)
MCH: 28.9 pg (ref 26.0–34.0)
MCHC: 33.8 g/dL (ref 32.0–36.0)
MCV: 85.5 fL (ref 80.0–100.0)
PLATELETS: 336 10*3/uL (ref 150–440)
RBC: 5.49 MIL/uL — AB (ref 3.80–5.20)
RDW: 13.9 % (ref 11.5–14.5)
WBC: 15.4 10*3/uL — ABNORMAL HIGH (ref 3.6–11.0)

## 2016-04-03 LAB — TROPONIN I: Troponin I: 0.03 ng/mL (ref <0.03)

## 2016-04-03 LAB — FIBRIN DERIVATIVES D-DIMER (ARMC ONLY): FIBRIN DERIVATIVES D-DIMER (ARMC): 478 (ref 0–499)

## 2016-04-03 MED ORDER — IBUPROFEN 600 MG PO TABS: 600.0000 mg | ORAL_TABLET | Freq: Once | ORAL | Status: DC

## 2016-04-03 MED ORDER — IBUPROFEN 600 MG PO TABS: 600.0000 mg | ORAL_TABLET | Freq: Three times a day (TID) | ORAL | Status: DC | PRN

## 2016-04-03 MED ORDER — ONDANSETRON HCL 4 MG/2ML IJ SOLN
4.0000 mg | Freq: Once | INTRAMUSCULAR | Status: AC
  Administered 2016-04-03: 4 mg via INTRAVENOUS
  Filled 2016-04-03: qty 2

## 2016-04-03 MED ORDER — ASPIRIN 81 MG PO CHEW
324.0000 mg | CHEWABLE_TABLET | Freq: Once | ORAL | Status: AC
  Administered 2016-04-03: 324 mg via ORAL
  Filled 2016-04-03: qty 4

## 2016-04-03 MED ORDER — SODIUM CHLORIDE 0.9 % IV BOLUS (SEPSIS)
1000.0000 mL | Freq: Once | INTRAVENOUS | Status: AC
  Administered 2016-04-03: 1000 mL via INTRAVENOUS
  Filled 2016-04-03: qty 1000

## 2016-04-03 MED ORDER — MORPHINE SULFATE (PF) 4 MG/ML IV SOLN
4.0000 mg | Freq: Once | INTRAVENOUS | Status: AC
Start: 2016-04-03 — End: 2016-04-03
  Administered 2016-04-03: 4 mg via INTRAVENOUS
  Filled 2016-04-03: qty 1

## 2016-04-03 MED ORDER — SODIUM CHLORIDE 0.9 % IV BOLUS (SEPSIS)
1000.0000 mL | Freq: Once | INTRAVENOUS | Status: AC
  Administered 2016-04-03: 1000 mL via INTRAVENOUS

## 2016-04-03 MED ORDER — KETOROLAC TROMETHAMINE 30 MG/ML IJ SOLN
15.0000 mg | Freq: Once | INTRAMUSCULAR | Status: AC
  Administered 2016-04-03: 15 mg via INTRAVENOUS
  Filled 2016-04-03: qty 1

## 2016-04-03 NOTE — ED Provider Notes (Signed)
[facility] Emergency Department Provider Note   ____________________________________________  Time seen: Approximately 12:07 PM  I have reviewed the triage vital signs and the nursing notes.   HISTORY  Chief Complaint Shoulder Pain    HPI Laura Small is a 55 y.o. female with no chronic medical problems who presents for evaluation of constant left chest pain at rest today radiating into the left shoulder/arm, gradual onset 11:30 AM, constant, moderate to severe and worse with movement and deep inspiration. Pain is not worse with exertion, not ripping or tearing in nature does not radiate to the back or down towards the feet. She has had mild shortness of breath and nausea. 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 Exercises included front raises with 15 lb weights. She denies any history of cornea artery disease, no history of PE or DVT. She has a strong family history of coronary artery disease with a family member on her dad's side having a heart attack in his early 55s. She did donate plasma yesterday. No recent surgeries, no estrogen use, no prolonged period of immobilization.   Past Medical History  Diagnosis Date  . PONV (postoperative nausea and vomiting)     usually has 1 episode of vomiting within 1 hour of anesthesia     Patient Active Problem List   Diagnosis Date Noted  . Ventral hernia without obstruction or gangrene 05/01/2015  . Periumbilical abdominal pain   . Acute cholecystitis 02/15/2015  . Abdominal hernia 02/15/2015  . Back ache 02/15/2015  . 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 Cholecystectomy    . Back surgery    . Ventral hernia repair N/A 02/12/2015    Procedure: HERNIA REPAIR VENTRAL ADULT;  Surgeon: Natale Lay, MD;  Location: [facility] ORS;  Service: General;  Laterality: N/A;  . Insertion of mesh N/A  02/12/2015    Procedure: INSERTION OF MESH;  Surgeon: Natale Lay, MD;  Location: [facility] ORS;  Service: General;  Laterality: N/A;    Current Outpatient Rx  Name  Route  Sig  Dispense  Refill  . ibuprofen (ADVIL,MOTRIN) 600 MG tablet   Oral   Take 1 tablet (600 mg total) by mouth every 8 (eight) hours as needed for moderate pain.   15 tablet   0     Allergies Bee venom  Family History  Problem Relation Age of Onset  . Colon cancer Neg Hx   . Diabetes Mother   . Heart disease Father   . Heart disease Paternal Grandmother     Social History Social History  Substance Use Topics  . Smoking status: Current Every Day Smoker    Types: Cigarettes  . Smokeless tobacco: Never Used  . Alcohol Use: No    Review of Systems Constitutional: No fever/chills Eyes: No visual changes. ENT: No sore throat. Cardiovascular: + chest pain. Respiratory: +shortness of breath. Gastrointestinal: No abdominal pain.  No nausea, no vomiting.  No diarrhea.  No constipation. Genitourinary: Negative for dysuria. Musculoskeletal: Negative for back pain. Skin: Negative for rash. Neurological: Negative for headaches, focal weakness or numbness.  10-point ROS otherwise negative.  ____________________________________________   PHYSICAL EXAM:  Filed Vitals:   04/03/16 1200 04/03/16 1330 04/03/16 1430 04/03/16 1600  BP: 118/83 114/68 115/70 108/68  Pulse: 106 85 84 86  Temp: 98.6 F (37 C)     TempSrc: Oral  Resp: SpO2: 95% 98% 98% 96%    VITAL SIGNS: ED Triage Vitals  Enc Vitals Group     BP 04/03/16 1200 118/83 mmHg     Pulse Rate 04/03/16 1200 106     Resp 04/03/16 1200 22     Temp 04/03/16 1200 98.6 F (37 C)     Temp Source 04/03/16 1200 Oral     SpO2 04/03/16 1200 95 %     Weight --      Height --      Head Cir --      Peak Flow --      Pain Score 04/03/16 1157 10     Pain Loc --      Pain Edu? --      Excl. in GC? --     Constitutional: Alert and oriented.  Tearful when she talks about her dogs possibly being taken away but does not appear to be in any pain. Eyes: Conjunctivae are normal. PERRL. EOMI. Head: Atraumatic. Nose: No congestion/rhinnorhea. Mouth/Throat: Mucous membranes are moist.  Oropharynx non-erythematous. Neck: No stridor.   Cardiovascular: Mildly tachycardic rate, regular rhythm. Grossly normal heart sounds.  Good peripheral circulation. Respiratory: Normal respiratory effort.  No retractions. Lungs CTAB. Gastrointestinal: Soft and nontender. No distention.  No CVA tenderness. Genitourinary: deferred Musculoskeletal: No lower extremity tenderness nor edema.  No joint effusions. Tenderness throughout the left upper chest wall, patient grimaces with palpation and attempts to move away from the painful stimulus. Full range of motion in the left arm, 2+ left radial pulse. No calf swelling, tenderness or asymmetry. Neurologic:  Normal speech and language. No gross focal neurologic deficits are appreciated. No gait instability. Skin:  Skin is warm, dry and intact. No rash noted. Psychiatric: Mood and affect are normal. Speech and behavior are normal.  ____________________________________________   LABS (all labs ordered are listed, but only abnormal results are displayed)  Labs Reviewed  BASIC METABOLIC PANEL - Abnormal; Notable for the following:    CO2 18 (*)    Glucose, Bld 161 (*)    All other components within normal limits  CBC - Abnormal; Notable for the following:    WBC 15.4 (*)    RBC 5.49 (*)    All other components within normal limits  TROPONIN I - Abnormal; Notable for the following:    Troponin I 0.03 (*)    All other components within normal limits  TROPONIN I  FIBRIN DERIVATIVES D-DIMER ([facility] ONLY)   ____________________________________________  EKG  ED ECG REPORT I, [name], the attending physician, personally viewed and interpreted this ECG.   Date: 04/03/2016  EKG Time: 11:57  Rate: 108   Rhythm: sinus tachycardia  Axis: normal  Intervals:none  ST&T Change: No acute ST elevation or acute ST depression. Normal QTC.  ____________________________________________  RADIOLOGY  CXR IMPRESSION: No active cardiopulmonary disease. ____________________________________________   PROCEDURES  Procedure(s) performed: None  Procedures  Critical Care performed: No  ____________________________________________   INITIAL IMPRESSION / ASSESSMENT AND PLAN / ED COURSE  Pertinent labs & imaging results that were available during my care of the patient were reviewed by me and considered in my medical decision making (see chart for details).  Keva Darty is a 55 y.o. female with no chronic medical problems who presents for evaluation of constant left chest pain at rest today radiating into the left shoulder/arm, gradual onset 11:30 AM, constant since onset. On exam, she is mildly tearful at times  but does not appear to be in any pain. Her vital signs are notable for mild tachycardia and mild tachypnea, she is afebrile maintaining an adequate blood pressure. Normal oxygen saturation. She appears to have reproducible chest wall pain in the left upper chest and I suspect this will be musculoskeletal in nature however given her mild tachycardia will send a d-dimer, we'll also check cardiac enzymes given her family history of coronary artery disease. We'll treat her pain and reassess for disposition.  ----------------------------------------- 4:02 PM on 04/03/2016 ----------------------------------------- Patient reports significant improvement of her pain at this time. She is resting comfortably. Her vital signs normalized. I reviewed her labs, CBC shows a leukocytosis, white blood cell count is 15.4, BMP with CO2 of 18 likely secondary to dehydration, she received IV fluids. D-dimer was not elevated, she has no clinical evidence for DVT, I doubt PE. Initial troponin less than 0.03,  second troponin is 0.03. I discussed this with cardiologist on-call, Dr. Gwen PoundsKowalski, he agrees this is unlikely to represent ACS and recommends no further treatment or cycling of enzymes. Chest x-ray clear. We discussed return precautions, need for close primary care follow-up and she is comfortable with the discharge plan. DC home with ibuprofen for likely musculo skeletal chest pain. ____________________________________________   FINAL CLINICAL IMPRESSION(S) / ED DIAGNOSES  Final diagnoses:  Chest pain, unspecified chest pain type      NEW MEDICATIONS STARTED DURING THIS VISIT:  New Prescriptions   IBUPROFEN (ADVIL,MOTRIN) 600 MG TABLET    Take 1 tablet (600 mg total) by mouth every 8 (eight) hours as needed for moderate pain.     Note:  This document was prepared using Dragon voice recognition software and may include unintentional dictation errors.    Gayla DossEryka A Indyah Saulnier, MD 04/03/16 [phone]

## 2016-04-03 NOTE — ED Notes (Signed)
Presents with left arm shoulder pain radiates in left arm and wrist-pain is described as sharp and dull, pain began while sitting with family outside. Endorses giving plasma yesterday. Pt is upset about her dogs being taken away from her possibly and is tearful. Endorses nausea. CBG 142 by EMS.

## 2016-08-03 ENCOUNTER — Emergency Department
Admission: EM | Admit: 2016-08-03 | Discharge: 2016-08-04 | Disposition: A | Discharge status: Home / Self Care | Payer: Self-pay | Attending: Emergency Medicine | Admitting: Emergency Medicine

## 2016-08-03 DIAGNOSIS — F17210 Nicotine dependence, cigarettes, uncomplicated: Secondary | ICD-10-CM | POA: Insufficient documentation

## 2016-08-03 DIAGNOSIS — M5442 Lumbago with sciatica, left side: Secondary | ICD-10-CM | POA: Insufficient documentation

## 2016-08-03 DIAGNOSIS — Z791 Long term (current) use of non-steroidal anti-inflammatories (NSAID): Secondary | ICD-10-CM | POA: Insufficient documentation

## 2016-08-03 NOTE — ED Notes (Signed)
Pt states she was taking off her pants and she felt a pop in her lumbar region of back.  Pt states [place] area of her back.  Pt states that the pain goes from lumbar region down to sacrum and into her L hip.  Pt unable to get out of wheelchair at this time due to pain level.  Pt tearful and yelling out upon this RN palpating lumbar and sacral region of back.

## 2016-08-03 NOTE — ED Triage Notes (Signed)
Pt states that she rolled over in bed and felt a pop in her lower back, pt reports hx of back surgeries, states pain is severe and pt is crying, pt was assisted out of the car to a wheelchair

## 2016-08-04 ENCOUNTER — Emergency Department: Payer: Self-pay

## 2016-08-04 MED ORDER — MORPHINE SULFATE (PF) 4 MG/ML IV SOLN
4.0000 mg | Freq: Once | INTRAVENOUS | Status: AC
Start: 2016-08-04 — End: 2016-08-04
  Administered 2016-08-04: 4 mg via INTRAMUSCULAR
  Filled 2016-08-04: qty 1

## 2016-08-04 MED ORDER — ONDANSETRON 4 MG PO TBDP
4.0000 mg | ORAL_TABLET | Freq: Once | ORAL | Status: AC
  Administered 2016-08-04: 4 mg via ORAL
  Filled 2016-08-04: qty 1

## 2016-08-04 MED ORDER — LIDOCAINE 5 % EX PTCH
1.0000 | MEDICATED_PATCH | CUTANEOUS | Status: DC
  Administered 2016-08-04: 1 via TRANSDERMAL
  Filled 2016-08-04: qty 1

## 2016-08-04 MED ORDER — DIAZEPAM 5 MG PO TABS
5.0000 mg | ORAL_TABLET | Freq: Once | ORAL | Status: AC
Start: 2016-08-04 — End: 2016-08-04
  Administered 2016-08-04: 5 mg via ORAL
  Filled 2016-08-04: qty 1

## 2016-08-04 MED ORDER — KETOROLAC TROMETHAMINE 60 MG/2ML IM SOLN
60.0000 mg | Freq: Once | INTRAMUSCULAR | Status: AC
  Administered 2016-08-04: 60 mg via INTRAMUSCULAR
  Filled 2016-08-04: qty 2

## 2016-08-04 MED ORDER — ETODOLAC 200 MG PO CAPS: 200.0000 mg | ORAL_CAPSULE | Freq: Three times a day (TID) | ORAL | 0 refills | Status: DC

## 2016-08-04 MED ORDER — LIDOCAINE 5 % EX PTCH: 1.0000 | MEDICATED_PATCH | Freq: Two times a day (BID) | CUTANEOUS | 0 refills | Status: DC

## 2016-08-04 MED ORDER — DIAZEPAM 5 MG PO TABS: 5.0000 mg | ORAL_TABLET | Freq: Three times a day (TID) | ORAL | 0 refills | Status: DC | PRN

## 2016-08-04 NOTE — ED Provider Notes (Signed)
[facility] Emergency Department Provider Note   ____________________________________________   First MD Initiated Contact with Patient 08/04/16 0032     (approximate)  I have reviewed the triage vital signs and the nursing notes.   HISTORY  Chief Complaint Back Pain    HPI Laura Small is a 55 y.o. female comes into the hospital today with excruciating pain in her lumbar area. She reports it is going into her left hip. The patient reports that it started around 7:45 PM. She was getting way to go to bed and was taking off her pants. She felt some popping in her back and then developed the pain. The patient denies any fall. She was bending over at the time. She has not taken anything for pain at home as she reports that she has nothing that will work for this pain. She reports that her bone slide back and forth sometimes. There is no longer a disc there because she's had a laminectomy and discectomy. The patient has had no incontinence and no genital paresthesias. The patient reports that just to wiggle her toes causes severe pain. The patient rates her pain a 10 out of 10 in intensity. He is here for treatment and evaluation.   Past Medical History:  Diagnosis Date  . PONV (postoperative nausea and vomiting)    usually has 1 episode of vomiting within 1 hour of anesthesia     Patient Active Problem List   Diagnosis Date Noted  . Ventral hernia without obstruction or gangrene 05/01/2015  . Periumbilical abdominal pain   . Acute cholecystitis 02/15/2015  . Abdominal hernia 02/15/2015  . Back ache 02/15/2015  . History of biliary disease 02/15/2015    Past Surgical History:  Procedure Laterality Date  . BACK SURGERY    . CHOLECYSTECTOMY    . INSERTION OF MESH N/A 02/12/2015   Procedure: INSERTION OF MESH;  Surgeon: Natale LayMark Bird, MD;  Location: [facility] ORS;  Service: General;  Laterality: N/A;  . VENTRAL HERNIA REPAIR N/A 02/12/2015   Procedure: HERNIA  REPAIR VENTRAL ADULT;  Surgeon: Natale LayMark Bird, MD;  Location: [facility] ORS;  Service: General;  Laterality: N/A;    Prior to Admission medications   Medication Sig Start Date End Date Taking? Authorizing Provider  diazepam (VALIUM) 5 MG tablet Take 1 tablet (5 mg total) by mouth every 8 (eight) hours as needed for anxiety. 08/04/16 08/04/17  Rebecka ApleyAllison P Webster, MD  etodolac (LODINE) 200 MG capsule Take 1 capsule (200 mg total) by mouth every 8 (eight) hours. 08/04/16   Rebecka ApleyAllison P Webster, MD  ibuprofen (ADVIL,MOTRIN) 600 MG tablet Take 1 tablet (600 mg total) by mouth every 8 (eight) hours as needed for moderate pain. 04/03/16   Gayla DossEryka A Gayle, MD  lidocaine (LIDODERM) 5 % Place 1 patch onto the skin every 12 (twelve) hours. Remove & Discard patch within 12 hours or as directed by MD 08/04/16 08/04/17  Rebecka ApleyAllison P Webster, MD    Allergies Bee venom  Family History  Problem Relation Age of Onset  . Colon cancer Neg Hx   . Diabetes Mother   . Heart disease Father   . Heart disease Paternal Grandmother     Social History Social History  Substance Use Topics  . Smoking status: Current Every Day Smoker    Types: Cigarettes  . Smokeless tobacco: Never Used  . Alcohol use No    Review of Systems Constitutional: No fever/chills Eyes: No visual changes. ENT: No sore throat. Cardiovascular:  Denies chest pain. Respiratory: Denies shortness of breath. Gastrointestinal: No abdominal pain.  No nausea, no vomiting.  No diarrhea.  No constipation. Genitourinary: Negative for dysuria. Musculoskeletal: back pain. Skin: Negative for rash. Neurological: Negative for headaches, focal weakness or numbness.  10-point ROS otherwise negative.  ____________________________________________   PHYSICAL EXAM:  VITAL SIGNS: ED Triage Vitals  Enc Vitals Group     BP 08/03/16 2335 (!) 141/70     Pulse Rate 08/03/16 2335 (!) 114     Resp 08/03/16 2335 20     Temp 08/03/16 2335 98.1 F (36.7 C)     Temp  Source 08/03/16 2335 Oral     SpO2 08/03/16 2335 97 %     Weight 08/03/16 2335 234 lb (106.1 kg)     Height 08/03/16 2335 5\' 7"  (1.702 m)     Head Circumference --      Peak Flow --      Pain Score 08/03/16 2336 10     Pain Loc --      Pain Edu? --      Excl. in GC? --     Constitutional: Alert and oriented. Well appearing and in Mild distress. Eyes: Conjunctivae are normal. PERRL. EOMI. Head: Atraumatic. Nose: No congestion/rhinnorhea. Mouth/Throat: Mucous membranes are moist.  Oropharynx non-erythematous. Cardiovascular: Normal rate, regular rhythm. Grossly normal heart sounds.  Good peripheral circulation. Respiratory: Normal respiratory effort.  No retractions. Lungs CTAB. Gastrointestinal: Soft and nontender. No distention. Positive bowel sounds Musculoskeletal: Numbness palpation of lumbar spine as well as bilateral SI joints. Pain with moving legs.   Neurologic:  Normal speech and language.  Skin:  Skin is warm, dry and intact. Marland Kitchen. Psychiatric: Mood and affect are normal.   ____________________________________________   LABS (all labs ordered are listed, but only abnormal results are displayed)  Labs Reviewed - No data to display ____________________________________________  EKG  none ____________________________________________  RADIOLOGY  Lumbar spine xray ____________________________________________   PROCEDURES  Procedure(s) performed: None  Procedures  Critical Care performed: No  ____________________________________________   INITIAL IMPRESSION / ASSESSMENT AND PLAN / ED COURSE  Pertinent labs & imaging results that were available during my care of the patient were reviewed by me and considered in my medical decision making (see chart for details).  This is a 55 year old female who comes into the hospital today with some low back pain. The patient has had some problems with her back as well as surgeries in the past. I will give the patient a shot  of morphine, Toradol, a dose of Valium as well as a Lidoderm patch. I will then reassess the patient.  Clinical Course as of Aug 04 257  Mon Aug 04, 2016  0144 Negative. DG Lumbar Spine 2-3 Views [AW]    Clinical Course User Index [AW] Rebecka ApleyAllison P Webster, MD   The patient's pain is improved. She will be discharged home to follow-up with orthopedic surgery. The patient has no further complaints or concerns at this time.  ____________________________________________   FINAL CLINICAL IMPRESSION(S) / ED DIAGNOSES  Final diagnoses:  Acute midline low back pain with left-sided sciatica      NEW MEDICATIONS STARTED DURING THIS VISIT:  New Prescriptions   DIAZEPAM (VALIUM) 5 MG TABLET    Take 1 tablet (5 mg total) by mouth every 8 (eight) hours as needed for anxiety.   ETODOLAC (LODINE) 200 MG CAPSULE    Take 1 capsule (200 mg total) by mouth every 8 (eight) hours.   LIDOCAINE (LIDODERM)  5 %    Place 1 patch onto the skin every 12 (twelve) hours. Remove & Discard patch within 12 hours or as directed by MD     Note:  This document was prepared using Dragon voice recognition software and may include unintentional dictation errors.    Rebecka Apley, MD 08/04/16 [phone]

## 2016-09-08 ENCOUNTER — Ambulatory Visit (INDEPENDENT_AMBULATORY_CARE_PROVIDER_SITE_OTHER): Payer: Self-pay | Admitting: Family Medicine

## 2016-09-08 ENCOUNTER — Encounter: Payer: Self-pay | Admitting: Family Medicine

## 2016-09-08 VITALS — BP 124/85 | HR 108 | Temp 98.0°F | Resp 16 | Wt 218.0 lb

## 2016-09-08 DIAGNOSIS — Z13220 Encounter for screening for lipoid disorders: Secondary | ICD-10-CM

## 2016-09-08 DIAGNOSIS — Z130 Encounter for screening for diseases of the blood and blood-forming organs and certain disorders involving the immune mechanism: Secondary | ICD-10-CM

## 2016-09-08 DIAGNOSIS — M545 Low back pain, unspecified: Secondary | ICD-10-CM

## 2016-09-08 DIAGNOSIS — E66811 Obesity, class 1: Secondary | ICD-10-CM

## 2016-09-08 DIAGNOSIS — G8929 Other chronic pain: Secondary | ICD-10-CM | POA: Insufficient documentation

## 2016-09-08 DIAGNOSIS — R110 Nausea: Secondary | ICD-10-CM

## 2016-09-08 DIAGNOSIS — E669 Obesity, unspecified: Secondary | ICD-10-CM

## 2016-09-08 LAB — COMPREHENSIVE METABOLIC PANEL
ALBUMIN: 4.2 g/dL (ref 3.5–5.2)
ALK PHOS: 127 U/L — AB (ref 39–117)
ALT: 29 U/L (ref 0–35)
AST: 20 U/L (ref 0–37)
BILIRUBIN TOTAL: 0.6 mg/dL (ref 0.2–1.2)
BUN: 11 mg/dL (ref 6–23)
CALCIUM: 9.8 mg/dL (ref 8.4–10.5)
CO2: 22 mEq/L (ref 19–32)
Chloride: 98 mEq/L (ref 96–112)
Creatinine, Ser: 0.73 mg/dL (ref 0.40–1.20)
GFR: 88.62 mL/min (ref >60.00)
GLUCOSE: 478 mg/dL — AB (ref 70–99)
Potassium: 3.8 mEq/L (ref 3.5–5.1)
Sodium: 133 mEq/L — ABNORMAL LOW (ref 135–145)
TOTAL PROTEIN: 7.5 g/dL (ref 6.0–8.3)

## 2016-09-08 LAB — CBC
HCT: 47 % — ABNORMAL HIGH (ref 36.0–46.0)
HEMOGLOBIN: 16.2 g/dL — AB (ref 12.0–15.0)
MCHC: 34.4 g/dL (ref 30.0–36.0)
MCV: 84.3 fl (ref 78.0–100.0)
PLATELETS: 249 10*3/uL (ref 150.0–400.0)
RBC: 5.58 Mil/uL — AB (ref 3.87–5.11)
RDW: 14.1 % (ref 11.5–15.5)
WBC: 10.9 10*3/uL — AB (ref 4.0–10.5)

## 2016-09-08 LAB — LIPID PANEL
CHOLESTEROL: 249 mg/dL — AB (ref 0–200)
HDL: 24.4 mg/dL — ABNORMAL LOW (ref >39.00)
NonHDL: 224.36
Total CHOL/HDL Ratio: 10
Triglycerides: 237 mg/dL — ABNORMAL HIGH (ref 0.0–149.0)
VLDL: 47.4 mg/dL — ABNORMAL HIGH (ref 0.0–40.0)

## 2016-09-08 LAB — HEMOGLOBIN A1C: HEMOGLOBIN A1C: 9.5 % — AB (ref 4.6–6.5)

## 2016-09-08 LAB — LDL CHOLESTEROL, DIRECT: Direct LDL: 194 mg/dL

## 2016-09-08 MED ORDER — HYDROCODONE-ACETAMINOPHEN 5-325 MG PO TABS: 1.0000 | ORAL_TABLET | ORAL | 0 refills | Status: DC | PRN

## 2016-09-08 MED ORDER — ONDANSETRON HCL 4 MG PO TABS: 4.0000 mg | ORAL_TABLET | Freq: Three times a day (TID) | ORAL | 0 refills | Status: DC | PRN

## 2016-09-08 MED ORDER — CYCLOBENZAPRINE HCL 10 MG PO TABS: 10.0000 mg | ORAL_TABLET | Freq: Three times a day (TID) | ORAL | 0 refills | Status: DC | PRN

## 2016-09-08 NOTE — Patient Instructions (Signed)
Use the medications as needed.  Stay hydrated.  We will call regarding the results and with the MRI/Referral.  Take care   Dr. Adriana Simasook

## 2016-09-08 NOTE — Progress Notes (Signed)
Subjective:  Patient ID: Laura Small, female    DOB: 03/18/1961  Age: 55 y.o. MRN: [id number]  CC: Low back pain, Nausea  HPI Laura MochaShannon Lao is a 55 y.o. female presents to the clinic today as a new patient with complaints of low back pain.  Low back pain  Patient reports that she's had severe low back pain since the middle of November.  She states that it started all she was changing clothes and she heard a "pop" in her low back.  She was seen in the ED for evaluation. X-rays were obtained and her pain was treated. She was instructed to follow-up with orthopedic surgery.  Additionally, she has been seen by another physician and was treated with prednisone and baclofen and was also given tramadol.  She has had little improvement in her pain.  She is currently only taking tramadol with little improvement.  Back pain is severe. Located in the midline.  She reports radiation down both legs and associated numbness obtaining.  No reports of bowel or bladder incontinence.  In addition, she reports that pain interferes with ambulation. Also interferes with lying down/sleeping.  Nausea, diarrhea  Additionally, patient states that for the past 36 hours she's had nausea and diarrhea.  No reported sick contacts.  She reports decreased intake.   No associative fever.  No medications or interventions tried.  He reports that she's had some abdominal pain particularly around the side of her prior hernia repair.  No other associated symptoms.  No other complaints at this time.  PMH, Surgical Hx, Family Hx, Social History reviewed and updated as below.  Past Medical History:  Diagnosis Date  . Back pain   . Chicken pox   . Migraines   . PONV (postoperative nausea and vomiting)    usually has 1 episode of vomiting within 1 hour of anesthesia    Past Surgical History:  Procedure Laterality Date  . BACK SURGERY    . CHOLECYSTECTOMY    . INSERTION OF MESH N/A  02/12/2015   Procedure: INSERTION OF MESH;  Surgeon: Natale LayMark Bird, MD;  Location: [facility] ORS;  Service: General;  Laterality: N/A;  . VENTRAL HERNIA REPAIR N/A 02/12/2015   Procedure: HERNIA REPAIR VENTRAL ADULT;  Surgeon: Natale LayMark Bird, MD;  Location: [facility] ORS;  Service: General;  Laterality: N/A;   Family History  Problem Relation Age of Onset  . Diabetes Mother   . Gallbladder disease Mother   . Heart disease Father   . Alcohol abuse Father   . Gallbladder disease Father   . Heart disease Paternal Grandmother   . Colon cancer Neg Hx    Social History  Substance Use Topics  . Smoking status: Current Every Day Smoker    Types: Cigarettes  . Smokeless tobacco: Never Used  . Alcohol use No   Review of Systems  Gastrointestinal: Positive for nausea.  Musculoskeletal: Positive for back pain and myalgias.  All other systems reviewed and are negative.  Objective:   Today's Vitals: BP 124/85 (BP Location: Left Arm, Patient Position: Sitting, Cuff Size: Large)   Pulse (!) 108   Temp 98 F (36.7 C) (Oral)   Resp 16   Wt 218 lb (98.9 kg)   SpO2 97%   BMI 34.14 kg/m   Physical Exam  Constitutional: She is oriented to person, place, and time. She appears well-developed.  Unkempt. Poor hygiene. Appears in pain.  HENT:  Head: Normocephalic and atraumatic.  Mouth/Throat: Oropharynx is clear and moist.  Poor dentition. No upper dentition. Lower dentition - few teeth left; remaining teeth are black.  Eyes: Conjunctivae are normal.  Neck: Normal range of motion.  Cardiovascular: Regular rhythm.  Tachycardia present.   Pulmonary/Chest: Effort normal and breath sounds normal.  Abdominal: Soft. She exhibits no distension.  Mildly tender in the periumbilical & epigastric regions.   Musculoskeletal:  Low back - discrete area of tenderness in the midline. Decreased range of motion in all planes secondary to pain.  Neurological: She is alert and oriented to person, place, and time.  Psychiatric:  She has a normal mood and affect.  Vitals reviewed.  Assessment & Plan:   Problem List Items Addressed This Visit    Severe low back pain - Primary    New problem. Xray revealed degenerative disc. Treating with Vicodin (5 day supply given), flexeril.  Needs MRI and referral to neurosurgery - arranging.      Relevant Medications   cyclobenzaprine (FLEXERIL) 10 MG tablet   HYDROcodone-acetaminophen (NORCO/VICODIN) 5-325 MG tablet   Other Relevant Orders   Comprehensive metabolic panel   Nausea    New problem. PRN Zofran. Labs today.       Other Visit Diagnoses    Screening for deficiency anemia       Relevant Orders   CBC   Screening, lipid       Relevant Orders   Lipid panel   Obesity (BMI 30.0-34.9)       Relevant Orders   Hemoglobin A1c      Outpatient Encounter Prescriptions as of 09/08/2016  Medication Sig  . cyclobenzaprine (FLEXERIL) 10 MG tablet Take 1 tablet (10 mg total) by mouth 3 (three) times daily as needed for muscle spasms.  Marland Kitchen. HYDROcodone-acetaminophen (NORCO/VICODIN) 5-325 MG tablet Take 1 tablet by mouth every 4 (four) hours as needed for moderate pain.  Marland Kitchen. ondansetron (ZOFRAN) 4 MG tablet Take 1 tablet (4 mg total) by mouth every 8 (eight) hours as needed for nausea or vomiting.  . [DISCONTINUED] diazepam (VALIUM) 5 MG tablet Take 1 tablet (5 mg total) by mouth every 8 (eight) hours as needed for anxiety.  . [DISCONTINUED] etodolac (LODINE) 200 MG capsule Take 1 capsule (200 mg total) by mouth every 8 (eight) hours.  . [DISCONTINUED] ibuprofen (ADVIL,MOTRIN) 600 MG tablet Take 1 tablet (600 mg total) by mouth every 8 (eight) hours as needed for moderate pain.  . [DISCONTINUED] lidocaine (LIDODERM) 5 % Place 1 patch onto the skin every 12 (twelve) hours. Remove & Discard patch within 12 hours or as directed by MD  . [DISCONTINUED] traMADol (ULTRAM) 50 MG tablet Take by mouth.  . [DISCONTINUED] fentaNYL (SUBLIMAZE) injection 50 mcg   . [DISCONTINUED]  sodium chloride 0.9 % injection 3 mL    No facility-administered encounter medications on file as of 09/08/2016.     Follow-up: Return in about 1 month (around 10/09/2016).  Everlene OtherJayce Walther Sanagustin DO [facility] [place]

## 2016-09-08 NOTE — Assessment & Plan Note (Signed)
New problem. Xray revealed degenerative disc. Treating with Vicodin (5 day supply given), flexeril.  Needs MRI and referral to neurosurgery - arranging.

## 2016-09-08 NOTE — Assessment & Plan Note (Signed)
New problem. PRN Zofran. Labs today.

## 2016-09-08 NOTE — Progress Notes (Signed)
Pre visit review using our clinic review tool, if applicable. No additional management support is needed unless otherwise documented below in the visit note. 

## 2016-09-09 ENCOUNTER — Other Ambulatory Visit: Payer: Self-pay | Admitting: Family Medicine

## 2016-09-09 MED ORDER — METFORMIN HCL 500 MG PO TABS
500.0000 mg | ORAL_TABLET | Freq: Two times a day (BID) | ORAL | 3 refills | Status: DC
Start: 2016-09-09 — End: 2017-01-08

## 2016-09-16 ENCOUNTER — Other Ambulatory Visit: Payer: Self-pay

## 2016-09-16 ENCOUNTER — Telehealth: Payer: Self-pay | Admitting: Family Medicine

## 2016-09-16 NOTE — Telephone Encounter (Signed)
HYDROcodone-acetaminophen (NORCO/VICODIN) 5-325 MG tablet ?

## 2016-09-16 NOTE — Telephone Encounter (Signed)
Pt has requested a update on this medication  

## 2016-09-16 NOTE — Telephone Encounter (Signed)
Refill request placed

## 2016-09-16 NOTE — Telephone Encounter (Signed)
Last office visit 09/08/16 Nov 10/10/2016

## 2016-09-16 NOTE — Telephone Encounter (Addendum)
Pt spouse called and stated that she has no more medication, and they really need this to be done. Please call when completed. Please advise, thank you!

## 2016-09-17 ENCOUNTER — Other Ambulatory Visit: Payer: Self-pay | Admitting: Family Medicine

## 2016-09-17 DIAGNOSIS — M5136 Other intervertebral disc degeneration, lumbar region: Secondary | ICD-10-CM

## 2016-09-17 NOTE — Telephone Encounter (Signed)
pts brother came here to the office to get her rx and was told that she will need to schedule another visit before getting anymore refills on her medication.

## 2016-09-17 NOTE — Telephone Encounter (Signed)
Pts husband was called and told that the medication was routed to provider to approve. He stated he called from 1145 a.m. To 5 p.m. For get medication approved or a message sent to provider to get medication. Pt was advised that provider was out of the office yesterday.

## 2016-09-17 NOTE — Telephone Encounter (Signed)
Will need to be seen again.

## 2016-09-17 NOTE — Telephone Encounter (Signed)
Pt husband called back regarding pt pain medication. Husband is getting a little upset. Thank you!  Call husband @ [phone].

## 2016-09-17 NOTE — Telephone Encounter (Signed)
Refilled 09/08/16. Pt last seen 09/08/16. Please advise?

## 2016-09-23 ENCOUNTER — Encounter: Payer: Self-pay | Admitting: Family Medicine

## 2016-09-23 ENCOUNTER — Ambulatory Visit (INDEPENDENT_AMBULATORY_CARE_PROVIDER_SITE_OTHER): Payer: Self-pay | Admitting: Family Medicine

## 2016-09-23 VITALS — BP 156/88 | HR 123 | Temp 98.1°F | Resp 14 | Wt 218.4 lb

## 2016-09-23 DIAGNOSIS — M545 Low back pain, unspecified: Secondary | ICD-10-CM

## 2016-09-23 DIAGNOSIS — Z794 Long term (current) use of insulin: Secondary | ICD-10-CM | POA: Insufficient documentation

## 2016-09-23 DIAGNOSIS — E1165 Type 2 diabetes mellitus with hyperglycemia: Secondary | ICD-10-CM | POA: Insufficient documentation

## 2016-09-23 DIAGNOSIS — E782 Mixed hyperlipidemia: Secondary | ICD-10-CM

## 2016-09-23 DIAGNOSIS — E119 Type 2 diabetes mellitus without complications: Secondary | ICD-10-CM

## 2016-09-23 DIAGNOSIS — E785 Hyperlipidemia, unspecified: Secondary | ICD-10-CM | POA: Insufficient documentation

## 2016-09-23 MED ORDER — HYDROCODONE-ACETAMINOPHEN 5-325 MG PO TABS: 1.0000 | ORAL_TABLET | Freq: Four times a day (QID) | ORAL | 0 refills | Status: DC | PRN

## 2016-09-23 NOTE — Progress Notes (Signed)
Subjective:  Patient ID: Laura Small, female    DOB: 10/07/1960  Age: 56 y.o. MRN: [id number]  CC: Follow up  HPI:  56 year old female with back pain presents for follow-up.  DM-2  New diagnosis.  At last visit A1c was obtained and was 9.5.  Patient was started on metformin.  She has not been taking the medication as she is a Brewing technologist"practicing herbalist".  She does not want to proceed with medication at this time.  She states that her sugars are in the 200s.  Hyperlipidemia  New diagnosis.  LDL markedly elevated at 194.  Advised treatment and patient has refused.   Low back pain   Patient has had back pain since November. Subacute.  Xray revealed moderate degenerative lumbar disc disease, greatest at L5-S1.  She had little improvement with tramadol, baclofen, prednisone.  I treated her pain with vicodin at our last visit (Hydrocodone 5/325 # 30; 5 day supply).  She states that her pain improves with the use of the pain medication for about 5-6 hours. Then recurs.  She still has difficulty with range of motion and with sleeping at night.  Neurosurgery referral in process.  She is requesting refill on her medication today.   Social Hx   Social History   Social History  . Marital status: Married    Spouse name: N/A  . Number of children: N/A  . Years of education: N/A   Social History Main Topics  . Smoking status: Current Every Day Smoker    Types: Cigarettes  . Smokeless tobacco: Never Used  . Alcohol use No  . Drug use: No  . Sexual activity: Not Currently    Partners: Male   Other Topics Concern  . None   Social History Narrative  . None   Review of Systems  Endocrine: Positive for polydipsia.  Musculoskeletal: Positive for back pain.   Objective:  BP (!) 156/88   Pulse (!) 123   Temp 98.1 F (36.7 C) (Oral)   Resp 14   Wt 218 lb 6.4 oz (99.1 kg)   SpO2 97%   BMI 34.21 kg/m   BP/Weight 09/23/2016 09/08/2016 08/04/2016  Systolic  BP 156 409124 126  Diastolic BP 88 85 61  Wt. (Lbs) 218.4 218 -  BMI 34.21 34.14 -   Physical Exam  Constitutional: She is oriented to person, place, and time. She appears well-developed. No distress.  Cardiovascular: Regular rhythm.  Tachycardia present.   Musculoskeletal:  Low back paraspinal musculature tenderness palpation. Decreased range of motion.  Neurological: She is alert and oriented to person, place, and time.  Psychiatric: She has a normal mood and affect.  Vitals reviewed.  Lab Results  Component Value Date   WBC 10.9 (H) 09/08/2016   HGB 16.2 (H) 09/08/2016   HCT 47.0 (H) 09/08/2016   PLT 249.0 09/08/2016   GLUCOSE 478 (H) 09/08/2016   CHOL 249 (H) 09/08/2016   TRIG 237.0 (H) 09/08/2016   HDL 24.40 (L) 09/08/2016   LDLDIRECT 194.0 09/08/2016   ALT 29 09/08/2016   AST 20 09/08/2016   NA 133 (L) 09/08/2016   K 3.8 09/08/2016   CL 98 09/08/2016   CREATININE 0.73 09/08/2016   BUN 11 09/08/2016   CO2 22 09/08/2016   HGBA1C 9.5 (H) 09/08/2016    Assessment & Plan:   Problem List Items Addressed This Visit    Severe low back pain    Improves with pain medication. Referral in process. [place] controlled substance database  reviewed today. No discrepancies/issues. Patient's medication refilled today (#60). If this becomes a chronic issue (>12 weeks) will need to see pain management.      Relevant Medications   HYDROcodone-acetaminophen (NORCO/VICODIN) 5-325 MG tablet   Hyperlipidemia    Uncontrolled. Patient has refused treatment. She has elected for lifestyle changes.      DM (diabetes mellitus), type 2 (HCC)    Uncontrolled. Advocated for treatment. Patient refused.         Meds ordered this encounter  Medications  . HYDROcodone-acetaminophen (NORCO/VICODIN) 5-325 MG tablet    Sig: Take 1 tablet by mouth every 6 (six) hours as needed for moderate pain.    Dispense:  60 tablet    Refill:  0    Follow-up: 1 month  Lucette Kratz DO [facility]

## 2016-09-23 NOTE — Assessment & Plan Note (Signed)
Improves with pain medication. Referral in process. [place] controlled substance database reviewed today. No discrepancies/issues. Patient's medication refilled today (#60). If this becomes a chronic issue (>12 weeks) will need to see pain management.

## 2016-09-23 NOTE — Assessment & Plan Note (Signed)
Uncontrolled. Patient has refused treatment. She has elected for lifestyle changes.

## 2016-09-23 NOTE — Assessment & Plan Note (Signed)
Uncontrolled. Advocated for treatment. Patient refused.

## 2016-09-23 NOTE — Progress Notes (Signed)
Pre visit review using our clinic review tool, if applicable. No additional management support is needed unless otherwise documented below in the visit note. 

## 2016-09-23 NOTE — Patient Instructions (Signed)
Continue to use the medication as needed.  You should get a call regarding the referral soon.  Consider seeing a pain management specialist. If this becomes a long term issue and the surgeon does not recommend surgery, we will have to proceed with this option.  Follow up in 1 month  Take care  Dr. Adriana Simasook

## 2016-10-06 ENCOUNTER — Other Ambulatory Visit: Payer: Self-pay | Admitting: Family Medicine

## 2016-10-06 MED ORDER — HYDROCODONE-ACETAMINOPHEN 5-325 MG PO TABS: 1.0000 | ORAL_TABLET | Freq: Four times a day (QID) | ORAL | 0 refills | Status: DC | PRN

## 2016-10-06 NOTE — Telephone Encounter (Signed)
Refilled 09/23/16. Pt last seen 09/23/16. Please advise/ 

## 2016-10-06 NOTE — Telephone Encounter (Signed)
Pt called requesting a refill on HYDROcodone-acetaminophen (NORCO/VICODIN) 5-325 MG tablet. She only has enough till tomorrow. Please advise, thank you!  Call pt @ [phone]

## 2016-10-06 NOTE — Telephone Encounter (Signed)
I will refill. Continued refills need to come from neurosurgery or pain management. I do not prescribed chronic narcotics. I am happy to refer to pain management pending her neurosurgery eval.

## 2016-10-06 NOTE — Telephone Encounter (Signed)
Pt called and told prescription was ready for pick up. Pt was advised that anymore refills will need to come from pain management or neurosurgery. Pt stated [facility] contacted her with an appt the end of this week.

## 2016-10-10 ENCOUNTER — Ambulatory Visit: Payer: Self-pay | Admitting: Family Medicine

## 2016-10-22 ENCOUNTER — Other Ambulatory Visit: Payer: Self-pay | Admitting: Family Medicine

## 2016-10-22 DIAGNOSIS — M545 Low back pain, unspecified: Secondary | ICD-10-CM

## 2016-10-22 MED ORDER — CYCLOBENZAPRINE HCL 10 MG PO TABS: 10.0000 mg | ORAL_TABLET | Freq: Three times a day (TID) | ORAL | 0 refills | Status: DC | PRN

## 2016-10-22 MED ORDER — HYDROCODONE-ACETAMINOPHEN 5-325 MG PO TABS: 1.0000 | ORAL_TABLET | Freq: Four times a day (QID) | ORAL | 0 refills | Status: DC | PRN

## 2016-10-22 NOTE — Telephone Encounter (Signed)
Pt called about not having anymore medication. Pt needs a refill for HYDROcodone-acetaminophen (NORCO/VICODIN) 5-325 MG tablet and cyclobenzaprine (FLEXERIL) 10 MG tablet.   Pharmacy is [facility] - [place], [place] - [address]   Call pt @ [phone]. Thank you!

## 2016-10-22 NOTE — Telephone Encounter (Signed)
Refilled 09/08/16. Pt last sen 12/18/7. Please advise/

## 2016-10-22 NOTE — Telephone Encounter (Signed)
Pt called and toll her RX was ready for pick up.

## 2016-10-23 ENCOUNTER — Other Ambulatory Visit: Payer: Self-pay | Admitting: Family Medicine

## 2016-10-27 ENCOUNTER — Ambulatory Visit: Payer: Self-pay | Admitting: Family Medicine

## 2016-11-07 ENCOUNTER — Other Ambulatory Visit: Payer: Self-pay | Admitting: Family Medicine

## 2016-11-07 MED ORDER — HYDROCODONE-ACETAMINOPHEN 5-325 MG PO TABS: 1.0000 | ORAL_TABLET | Freq: Four times a day (QID) | ORAL | 0 refills | Status: DC | PRN

## 2016-11-07 NOTE — Telephone Encounter (Signed)
Refilled 10/22/16. Awaiting pain management appt.

## 2016-11-10 NOTE — Telephone Encounter (Signed)
My Chart message sent

## 2016-11-24 ENCOUNTER — Other Ambulatory Visit: Payer: Self-pay | Admitting: Family Medicine

## 2016-11-24 NOTE — Telephone Encounter (Signed)
norco refilled 11/07/16. Flexeril refilled 10/22/16. Pt last seen 09/23/16. Please advise?

## 2016-11-25 MED ORDER — HYDROCODONE-ACETAMINOPHEN 5-325 MG PO TABS: 1.0000 | ORAL_TABLET | Freq: Four times a day (QID) | ORAL | 0 refills | Status: DC | PRN

## 2016-11-25 MED ORDER — CYCLOBENZAPRINE HCL 10 MG PO TABS: 10.0000 mg | ORAL_TABLET | Freq: Three times a day (TID) | ORAL | 0 refills | Status: DC | PRN

## 2016-11-25 NOTE — Telephone Encounter (Signed)
This will be last refill on Narcotics. I cannot keep refilling. Needs to see pain management.

## 2016-11-25 NOTE — Telephone Encounter (Signed)
MyChart message sent to pt in regards to rx being ready for pick up. rx placed up front.

## 2016-12-15 ENCOUNTER — Emergency Department: Payer: Self-pay

## 2016-12-15 ENCOUNTER — Emergency Department
Admission: EM | Admit: 2016-12-15 | Discharge: 2016-12-16 | Disposition: A | Discharge status: Home / Self Care | Payer: Self-pay | Attending: Emergency Medicine | Admitting: Emergency Medicine

## 2016-12-15 ENCOUNTER — Encounter: Payer: Self-pay | Admitting: Radiology

## 2016-12-15 DIAGNOSIS — R739 Hyperglycemia, unspecified: Secondary | ICD-10-CM

## 2016-12-15 DIAGNOSIS — E1165 Type 2 diabetes mellitus with hyperglycemia: Secondary | ICD-10-CM | POA: Insufficient documentation

## 2016-12-15 DIAGNOSIS — Z7984 Long term (current) use of oral hypoglycemic drugs: Secondary | ICD-10-CM | POA: Insufficient documentation

## 2016-12-15 DIAGNOSIS — F17210 Nicotine dependence, cigarettes, uncomplicated: Secondary | ICD-10-CM | POA: Insufficient documentation

## 2016-12-15 DIAGNOSIS — R1031 Right lower quadrant pain: Secondary | ICD-10-CM

## 2016-12-15 DIAGNOSIS — K529 Noninfective gastroenteritis and colitis, unspecified: Secondary | ICD-10-CM | POA: Insufficient documentation

## 2016-12-15 HISTORY — DX: Type 2 diabetes mellitus without complications: E11.9

## 2016-12-15 LAB — URINALYSIS, COMPLETE (UACMP) WITH MICROSCOPIC
BILIRUBIN URINE: NEGATIVE
Hgb urine dipstick: NEGATIVE
KETONES UR: 80 mg/dL — AB
LEUKOCYTES UA: NEGATIVE
Nitrite: NEGATIVE
PH: 5 (ref 5.0–8.0)
PROTEIN: NEGATIVE mg/dL
SPECIFIC GRAVITY, URINE: 1.038 — AB (ref 1.005–1.030)

## 2016-12-15 LAB — COMPREHENSIVE METABOLIC PANEL
ALBUMIN: 4.1 g/dL (ref 3.5–5.0)
ALK PHOS: 146 U/L — AB (ref 38–126)
ALT: 25 U/L (ref 14–54)
ANION GAP: 16 — AB (ref 5–15)
AST: 28 U/L (ref 15–41)
BILIRUBIN TOTAL: 1.1 mg/dL (ref 0.3–1.2)
BUN: 11 mg/dL (ref 6–20)
CALCIUM: 9.7 mg/dL (ref 8.9–10.3)
CO2: 16 mmol/L — ABNORMAL LOW (ref 22–32)
Chloride: 97 mmol/L — ABNORMAL LOW (ref 101–111)
Creatinine, Ser: 0.65 mg/dL (ref 0.44–1.00)
GFR calc Af Amer: >60 mL/min (ref >60)
GLUCOSE: 457 mg/dL — AB (ref 65–99)
Potassium: 4.5 mmol/L (ref 3.5–5.1)
Sodium: 129 mmol/L — ABNORMAL LOW (ref 135–145)
TOTAL PROTEIN: 7.8 g/dL (ref 6.5–8.1)

## 2016-12-15 LAB — CBC
HEMATOCRIT: 48.2 % — AB (ref 35.0–47.0)
Hemoglobin: 16.6 g/dL — ABNORMAL HIGH (ref 12.0–16.0)
MCH: 29.6 pg (ref 26.0–34.0)
MCHC: 34.4 g/dL (ref 32.0–36.0)
MCV: 86.1 fL (ref 80.0–100.0)
PLATELETS: 273 10*3/uL (ref 150–440)
RBC: 5.6 MIL/uL — ABNORMAL HIGH (ref 3.80–5.20)
RDW: 13.2 % (ref 11.5–14.5)
WBC: 15.2 10*3/uL — AB (ref 3.6–11.0)

## 2016-12-15 LAB — LACTIC ACID, PLASMA: LACTIC ACID, VENOUS: 1.3 mmol/L (ref 0.5–1.9)

## 2016-12-15 LAB — LIPASE, BLOOD: Lipase: 45 U/L (ref 11–51)

## 2016-12-15 MED ORDER — CIPROFLOXACIN HCL 500 MG PO TABS
500.0000 mg | ORAL_TABLET | ORAL | Status: AC
  Administered 2016-12-16: 500 mg via ORAL
  Filled 2016-12-15: qty 1

## 2016-12-15 MED ORDER — METRONIDAZOLE 500 MG PO TABS: 500.0000 mg | ORAL_TABLET | Freq: Three times a day (TID) | ORAL | 0 refills | Status: AC

## 2016-12-15 MED ORDER — ONDANSETRON HCL 4 MG/2ML IJ SOLN
4.0000 mg | Freq: Once | INTRAMUSCULAR | Status: AC
Start: 2016-12-15 — End: 2016-12-15
  Administered 2016-12-15: 4 mg via INTRAVENOUS

## 2016-12-15 MED ORDER — METRONIDAZOLE 500 MG PO TABS
500.0000 mg | ORAL_TABLET | ORAL | Status: AC
  Administered 2016-12-16: 500 mg via ORAL
  Filled 2016-12-15: qty 1

## 2016-12-15 MED ORDER — MORPHINE SULFATE (PF) 2 MG/ML IV SOLN
INTRAVENOUS | Status: AC
  Filled 2016-12-15: qty 2

## 2016-12-15 MED ORDER — MORPHINE SULFATE (PF) 4 MG/ML IV SOLN
4.0000 mg | Freq: Once | INTRAVENOUS | Status: AC
  Administered 2016-12-15: 4 mg via INTRAVENOUS

## 2016-12-15 MED ORDER — ONDANSETRON HCL 4 MG/2ML IJ SOLN
4.0000 mg | INTRAMUSCULAR | Status: DC
  Filled 2016-12-15: qty 2

## 2016-12-15 MED ORDER — MORPHINE SULFATE (PF) 4 MG/ML IV SOLN
4.0000 mg | Freq: Once | INTRAVENOUS | Status: AC
  Administered 2016-12-15: 4 mg via INTRAVENOUS
  Filled 2016-12-15: qty 1

## 2016-12-15 MED ORDER — ONDANSETRON 4 MG PO TBDP: ORAL_TABLET | ORAL | 0 refills | Status: DC

## 2016-12-15 MED ORDER — CIPROFLOXACIN HCL 500 MG PO TABS: 500.0000 mg | ORAL_TABLET | Freq: Two times a day (BID) | ORAL | 0 refills | Status: AC

## 2016-12-15 MED ORDER — IOPAMIDOL (ISOVUE-300) INJECTION 61%
30.0000 mL | Freq: Once | INTRAVENOUS | Status: AC | PRN
  Administered 2016-12-15: 30 mL via ORAL

## 2016-12-15 MED ORDER — ONDANSETRON HCL 4 MG/2ML IJ SOLN
INTRAMUSCULAR | Status: AC
  Filled 2016-12-15: qty 2

## 2016-12-15 MED ORDER — SODIUM CHLORIDE 0.9 % IV BOLUS (SEPSIS)
1000.0000 mL | INTRAVENOUS | Status: AC
  Administered 2016-12-15: 1000 mL via INTRAVENOUS

## 2016-12-15 MED ORDER — IOPAMIDOL (ISOVUE-300) INJECTION 61%
100.0000 mL | Freq: Once | INTRAVENOUS | Status: AC | PRN
Start: 2016-12-15 — End: 2016-12-15
  Administered 2016-12-15: 100 mL via INTRAVENOUS

## 2016-12-15 NOTE — ED Notes (Signed)
Patient transported to CT 

## 2016-12-15 NOTE — ED Provider Notes (Signed)
[facility] Emergency Department Provider Note  ____________________________________________   First MD Initiated Contact with Patient 12/15/16 2054     (approximate)  I have reviewed the triage vital signs and the nursing notes.   HISTORY  Chief Complaint Abdominal Pain    HPI Laura Small is a 56 y.o. female who presents for evaluation of acute onset right lower quadrant pain in the setting of general malaise, diarrhea, and decreased appetite for about a week.  She states that she took a nap this afternoon because of feeling fatigued and just generally ill and when she awoke she had severe sharp stabbing pain in her right lower quadrant.  Movement makes it worse and nothing makes it better.  She has also had persistent nausea, intermittent vomiting, persistent diarrhea for several days, almost a week.  She has had a cholecystectomy previously but has not had an appendectomy.  She is post menopausal for about 5 years.  She has had no dysuria and no hematuria.  She denies fever/chills, chest pain, shortness of breath.  She reports that she does not have diabetes and that her blood sugar has been elevated for at least 4 months since she took a course of steroids.  She states that she has discussed this with her primary care doctor and she refuses that she has diabetes and thinks that her blood sugar is elevated as a result of the prednisone.  She has been prescribed metformin but is not taking it by choice.  Past Medical History:  Diagnosis Date  . Back pain   . Chicken pox   . Diabetes mellitus without complication (HCC)   . Migraines   . PONV (postoperative nausea and vomiting)    usually has 1 episode of vomiting within 1 hour of anesthesia     Patient Active Problem List   Diagnosis Date Noted  . DM (diabetes mellitus), type 2 (HCC) 09/23/2016  . Hyperlipidemia 09/23/2016  . Severe low back pain 09/08/2016    Past Surgical History:  Procedure  Laterality Date  . BACK SURGERY    . CHOLECYSTECTOMY    . INSERTION OF MESH N/A 02/12/2015   Procedure: INSERTION OF MESH;  Surgeon: Natale Lay, MD;  Location: [facility] ORS;  Service: General;  Laterality: N/A;  . VENTRAL HERNIA REPAIR N/A 02/12/2015   Procedure: HERNIA REPAIR VENTRAL ADULT;  Surgeon: Natale Lay, MD;  Location: [facility] ORS;  Service: General;  Laterality: N/A;    Prior to Admission medications   Medication Sig Start Date End Date Taking? Authorizing Provider  ciprofloxacin (CIPRO) 500 MG tablet Take 1 tablet (500 mg total) by mouth 2 (two) times daily. 12/15/16 12/25/16  Loleta Rose, MD  cyclobenzaprine (FLEXERIL) 10 MG tablet Take 1 tablet (10 mg total) by mouth 3 (three) times daily as needed for muscle spasms. 11/25/16   Tommie Sams, DO  HYDROcodone-acetaminophen (NORCO/VICODIN) 5-325 MG tablet Take 1 tablet by mouth every 6 (six) hours as needed for moderate pain. 11/25/16   Tommie Sams, DO  metFORMIN (GLUCOPHAGE) 500 MG tablet Take 1 tablet (500 mg total) by mouth 2 (two) times daily with a meal. Patient not taking: Reported on 09/23/2016 09/09/16   Tommie Sams, DO  metroNIDAZOLE (FLAGYL) 500 MG tablet Take 1 tablet (500 mg total) by mouth 3 (three) times daily. 12/15/16 12/25/16  Loleta Rose, MD  ondansetron (ZOFRAN ODT) 4 MG disintegrating tablet Allow 1-2 tablets to dissolve in your mouth every 8 hours as needed for nausea/vomiting  12/15/16   Loleta Rose, MD  ondansetron (ZOFRAN) 4 MG tablet Take 1 tablet (4 mg total) by mouth every 8 (eight) hours as needed for nausea or vomiting. 09/08/16   Tommie Sams, DO    Allergies Bee venom; Advil [ibuprofen]; and Prednisone  Family History  Problem Relation Age of Onset  . Diabetes Mother   . Gallbladder disease Mother   . Heart disease Father   . Alcohol abuse Father   . Gallbladder disease Father   . Heart disease Paternal Grandmother   . Colon cancer Neg Hx     Social History Social History  Substance Use Topics  . Smoking  status: Current Every Day Smoker    Types: Cigarettes  . Smokeless tobacco: Never Used  . Alcohol use No    Review of Systems Constitutional: No fever/chills Eyes: No visual changes. ENT: No sore throat. Cardiovascular: Denies chest pain. Respiratory: Denies shortness of breath. Gastrointestinal: acute onset RLQ abdominal pain w/ N/V/D for about a week Genitourinary: Negative for dysuria. Musculoskeletal: Negative for back pain. Skin: Negative for rash. Neurological: Negative for headaches, focal weakness or numbness.  10-point ROS otherwise negative.  ____________________________________________   PHYSICAL EXAM:  VITAL SIGNS: ED Triage Vitals  Enc Vitals Group     BP 12/15/16 1837 (!) 112/96     Pulse Rate 12/15/16 1837 (!) 125     Resp 12/15/16 1837 16     Temp 12/15/16 1837 98.3 F (36.8 C)     Temp Source 12/15/16 1837 Oral     SpO2 12/15/16 1837 98 %     Weight 12/15/16 1838 197 lb (89.4 kg)     Height 12/15/16 1838 5\' 7"  (1.702 m)     Head Circumference --      Peak Flow --      Pain Score 12/15/16 1841 10     Pain Loc --      Pain Edu? --      Excl. in GC? --     Constitutional: Alert and oriented. Well appearing and in no acute distress. Eyes: Conjunctivae are normal. PERRL. EOMI. Head: Atraumatic. Nose: No congestion/rhinnorhea. Mouth/Throat: Mucous membranes are moist. Neck: No stridor.  No meningeal signs.   Cardiovascular: Normal rate, regular rhythm. Good peripheral circulation. Grossly normal heart sounds. Respiratory: Normal respiratory effort.  No retractions. Lungs CTAB. Gastrointestinal: Soft with severe RLQ tenderness to palpation with rebound Musculoskeletal: No lower extremity tenderness nor edema. No gross deformities of extremities. Neurologic:  Normal speech and language. No gross focal neurologic deficits are appreciated.  Skin:  Skin is warm, dry and intact. No rash noted. Psychiatric: Mood and affect are normal. Speech and behavior  are normal.  ____________________________________________   LABS (all labs ordered are listed, but only abnormal results are displayed)  Labs Reviewed  COMPREHENSIVE METABOLIC PANEL - Abnormal; Notable for the following:       Result Value   Sodium 129 (*)    Chloride 97 (*)    CO2 16 (*)    Glucose, Bld 457 (*)    Alkaline Phosphatase 146 (*)    Anion gap 16 (*)    All other components within normal limits  CBC - Abnormal; Notable for the following:    WBC 15.2 (*)    RBC 5.60 (*)    Hemoglobin 16.6 (*)    HCT 48.2 (*)    All other components within normal limits  URINALYSIS, COMPLETE (UACMP) WITH MICROSCOPIC - Abnormal; Notable for the following:  Color, Urine YELLOW (*)    APPearance HAZY (*)    Specific Gravity, Urine 1.038 (*)    Glucose, UA >=500 (*)    Ketones, ur 80 (*)    Bacteria, UA RARE (*)    Squamous Epithelial / LPF 0-5 (*)    All other components within normal limits  URINE CULTURE  LIPASE, BLOOD  LACTIC ACID, PLASMA  LACTIC ACID, PLASMA  HEMOGLOBIN A1C  C-REACTIVE PROTEIN  SEDIMENTATION RATE   ____________________________________________  EKG  None - EKG not ordered by ED physician ____________________________________________  RADIOLOGY   Ct Abdomen Pelvis W Contrast  Result Date: 12/15/2016 CLINICAL DATA:  Acute onset of right lower quadrant abdominal pain and nausea. Initial encounter. EXAM: CT ABDOMEN AND PELVIS WITH CONTRAST TECHNIQUE: Multidetector CT imaging of the abdomen and pelvis was performed using the standard protocol following bolus administration of intravenous contrast. CONTRAST:  100mL ISOVUE-300 IOPAMIDOL (ISOVUE-300) INJECTION 61% COMPARISON:  CT of the abdomen and pelvis performed 03/30/2015 FINDINGS: Lower chest: The visualized lung bases are grossly clear. The visualized portions of the mediastinum are unremarkable. Hepatobiliary: The liver is unremarkable in appearance. The patient is status post cholecystectomy, with clips  noted at the gallbladder fossa. The common bile duct remains normal in caliber. Pancreas: The pancreas is within normal limits. Spleen: The spleen is unremarkable in appearance. Adrenals/Urinary Tract: The adrenal glands are unremarkable in appearance. The kidneys are within normal limits. There is no evidence of hydronephrosis. No renal or ureteral stones are identified. No perinephric stranding is seen. Stomach/Bowel: The stomach is unremarkable in appearance. There is wall thickening involving a long segment of distal ileum, extending to the ileocecal junction. This is worsened from the prior study, but appears to chronically fluctuate in severity, and may reflect inflammatory bowel disease, vasculitis or possibly celiac disease. The appendix is normal in caliber, without evidence of appendicitis. The colon is unremarkable in appearance. Vascular/Lymphatic: Scattered calcification is seen along the abdominal aorta and its branches. The abdominal aorta is otherwise grossly unremarkable. The inferior vena cava is grossly unremarkable. No retroperitoneal lymphadenopathy is seen. No pelvic sidewall lymphadenopathy is identified. Reproductive: The bladder is mildly distended and grossly unremarkable. The uterus is grossly unremarkable in appearance. The ovaries are relatively symmetric. A small amount of free fluid is noted within the pelvis, likely physiologic in nature. Other: Trace postoperative fluid along the upper abdominal wall has resolved, with minimal residual postoperative inflammation. Musculoskeletal: No acute osseous abnormalities are identified. Mild facet disease is noted at the lower lumbar spine. The visualized musculature is unremarkable in appearance. IMPRESSION: 1. Wall thickening involving a long segment of distal ileum, extending to the ileocecal junction. This is worsened from the prior study, but appears to chronically fluctuate in severity, and may reflect inflammatory bowel disease,  vasculitis or possibly celiac disease. 2. Scattered aortic atherosclerosis. Electronically Signed   By: Roanna RaiderJeffery  Chang M.D.   On: 12/15/2016 23:06    ____________________________________________   PROCEDURES  Critical Care performed: No   Procedure(s) performed:   Procedures   ____________________________________________   INITIAL IMPRESSION / ASSESSMENT AND PLAN / ED COURSE  Pertinent labs & imaging results that were available during my care of the patient were reviewed by me and considered in my medical decision making (see chart for details).  Ovarian etiology is always possible but her presentation appears more consistent with appendicitis at this time.  She presents with tachycardia, leukocytosis, mild hyponatremia, etc., in the setting of about a week of anorexia, nausea, vomiting,  diarrhea, and now acute right lower quadrant pain.  I will evaluate with CT scan of the abdomen and pelvis.  The patient stands and agrees with plan.   Clinical Course as of Dec 16 2343  Mon Dec 15, 2016  2056 I reviewed the patient's prescription history over the last 12 months in the multi-state controlled substances database(s) that includes [place], [place], [place], [place], [place], [place], [place], [place], [place], [place], [place], [place], [place], and [place].  Results were notable for monthly prescriptions of Norco, one to 260, prescribed by Dr. Adriana Simas who is presumably her primary care doctor.  These prescriptions go back for 6 months.  Prior to that she was getting multiple prescriptions for tramadol.   [CF]  2057 Of note her most recent Norco prescription was filled about 20 days ago although of note as it is written it was 4 a duration of 15 days in spite of it being 60 tablets.  [CF]  2341 The patient's CT scan is notable for worsening ileitis compared to a prior study.  She states that no one has talked to her about inflammatory bowel disease,  ileitis, etc., in the past.  Her tachycardia has resolved and she is in no acute distress.  Given her leukocytosis and the inflammation versus infection on her CT scan, I will start her on Cipro and Flagyl.  However I think it is very likely she may suffer from Crohn's disease and I have added on a sedimentation rate and a CRP to assist with outpatient follow-up.  I also ordered a hemoglobin A1c given her significant hyperglycemia which she states has been present for months but she refuses a diagnosis of diabetes.  I believe this will likely help her primary care doctor confirm the diagnosis.In spite of multiple lab abnormalities and initial tachycardia, she is well-appearing and in no acute distress at this time.  I had a long talk with her about the need to follow up with gastroenterology and her primary care doctor.  I have sent a message through [facility] to Dr. Tobi Bastos with GI to help facilitate follow-up.  I gave her my usual and customary return precautions.  She understands and agrees with the plan and confirmed that she does have pain medication at home which she can use if needed.  [CF]    Clinical Course User Index [CF] Loleta Rose, MD    ____________________________________________  FINAL CLINICAL IMPRESSION(S) / ED DIAGNOSES  Final diagnoses:  Ileitis  Right lower quadrant abdominal pain  Hyperglycemia     MEDICATIONS GIVEN DURING THIS VISIT:  Medications  morphine 2 MG/ML injection (not administered)  ondansetron (ZOFRAN) injection 4 mg (not administered)  morphine 4 MG/ML injection 4 mg (4 mg Intravenous Given 12/15/16 1856)  ondansetron (ZOFRAN) injection 4 mg (4 mg Intravenous Given 12/15/16 1856)  sodium chloride 0.9 % bolus 1,000 mL (1,000 mLs Intravenous New Bag/Given 12/15/16 2144)  iopamidol (ISOVUE-300) 61 % injection 30 mL (30 mLs Oral Contrast Given 12/15/16 2122)  morphine 4 MG/ML injection 4 mg (4 mg Intravenous Given 12/15/16 2238)  iopamidol (ISOVUE-300) 61 % injection 100  mL (100 mLs Intravenous Contrast Given 12/15/16 2215)     NEW OUTPATIENT MEDICATIONS STARTED DURING THIS VISIT:  New Prescriptions   CIPROFLOXACIN (CIPRO) 500 MG TABLET    Take 1 tablet (500 mg total) by mouth 2 (two) times daily.   METRONIDAZOLE (FLAGYL) 500 MG TABLET    Take 1 tablet (500 mg total) by mouth 3 (three)  times daily.   ONDANSETRON (ZOFRAN ODT) 4 MG DISINTEGRATING TABLET    Allow 1-2 tablets to dissolve in your mouth every 8 hours as needed for nausea/vomiting    Modified Medications   No medications on file    Discontinued Medications   No medications on file     Note:  This document was prepared using Dragon voice recognition software and may include unintentional dictation errors.    Loleta Rose, MD 12/15/16 [phone]

## 2016-12-15 NOTE — Discharge Instructions (Signed)
As we discussed, your CT scan was consistent with worsening ileitis.  This may be the result of something called inflammatory bowel disease which includes conditions including Crohn's disease and ulcerative colitis.  It is also possible that you have an infection of that area of your bowel.  We have started you on antibiotics but you need to follow up with a gastroenterologist.  We included the name and number for Dr. Tobi BastosAnna and you can call and schedule an appointment with him or one of his colleagues.  Alternatively, your primary care doctor may refer you to a gastroenterologist.  Additionally, your blood sugar is significantly elevated although we understand it has been elevated for several months.  We added on a test called a hemoglobin A1c to help establish how elevated your glucose has been over time and to help determine a diagnosis of diabetes.  Please tell Dr. Adriana Simasook those results should be available in the computer system by the time you follow-up with him.    Return to the emergency department if you develop new or worsening symptoms that concern you.

## 2016-12-15 NOTE — ED Triage Notes (Signed)
Pt reports severe RLQ pain that began approximately 15 minutes prior to arrival. Pt denies NVD. Pt reports decreased appetite and not feeling right for one week prior. Pt writhing and moaning in triage.

## 2016-12-16 LAB — C-REACTIVE PROTEIN: CRP: 2.1 mg/dL — AB (ref <1.0)

## 2016-12-16 LAB — SEDIMENTATION RATE: Sed Rate: 19 mm/hr (ref 0–30)

## 2016-12-17 ENCOUNTER — Other Ambulatory Visit: Payer: Self-pay | Admitting: Family Medicine

## 2016-12-17 LAB — URINE CULTURE: SPECIAL REQUESTS: NORMAL

## 2016-12-17 LAB — HEMOGLOBIN A1C
HEMOGLOBIN A1C: 14.7 % — AB (ref 4.8–5.6)
Mean Plasma Glucose: 375 mg/dL

## 2016-12-17 NOTE — Telephone Encounter (Signed)
per [facility] Ivor Messier([place]) and pain she was scheduled to be seen on Jan 15. Pt cancelled the appointment. They have tried to reach her several times but she has not returned their call to reschedule this appointment.

## 2016-12-18 ENCOUNTER — Telehealth: Payer: Self-pay | Admitting: Emergency Medicine

## 2016-12-18 NOTE — Telephone Encounter (Addendum)
Called patient to inform of a1c and crp.  Her sister answered and says pt is in bed.  I gave her my number so patietn can call me back  1335 Patient called me back. I gave her results. Has a ppt with pcp.  She says she is actually improving since starting the antibiotics.

## 2016-12-24 ENCOUNTER — Ambulatory Visit: Payer: Self-pay | Admitting: Gastroenterology

## 2016-12-25 ENCOUNTER — Ambulatory Visit: Payer: Self-pay | Admitting: Family Medicine

## 2017-01-08 ENCOUNTER — Encounter: Payer: Self-pay | Admitting: Family Medicine

## 2017-01-08 ENCOUNTER — Ambulatory Visit (INDEPENDENT_AMBULATORY_CARE_PROVIDER_SITE_OTHER): Payer: Self-pay | Admitting: Family Medicine

## 2017-01-08 DIAGNOSIS — Z720 Tobacco use: Secondary | ICD-10-CM

## 2017-01-08 DIAGNOSIS — I700 Atherosclerosis of aorta: Secondary | ICD-10-CM | POA: Insufficient documentation

## 2017-01-08 DIAGNOSIS — K529 Noninfective gastroenteritis and colitis, unspecified: Secondary | ICD-10-CM

## 2017-01-08 DIAGNOSIS — E119 Type 2 diabetes mellitus without complications: Secondary | ICD-10-CM

## 2017-01-08 DIAGNOSIS — E782 Mixed hyperlipidemia: Secondary | ICD-10-CM

## 2017-01-08 DIAGNOSIS — M545 Low back pain: Secondary | ICD-10-CM

## 2017-01-08 DIAGNOSIS — G8929 Other chronic pain: Secondary | ICD-10-CM

## 2017-01-08 MED ORDER — BASAGLAR KWIKPEN 100 UNIT/ML ~~LOC~~ SOPN: 10.0000 [IU] | PEN_INJECTOR | Freq: Every day | SUBCUTANEOUS | 0 refills | Status: DC

## 2017-01-08 MED ORDER — ASPIRIN EC 81 MG PO TBEC: 81.0000 mg | DELAYED_RELEASE_TABLET | Freq: Every day | ORAL | Status: DC

## 2017-01-08 MED ORDER — METFORMIN HCL 500 MG PO TABS: 500.0000 mg | ORAL_TABLET | Freq: Two times a day (BID) | ORAL | 3 refills | Status: DC

## 2017-01-08 NOTE — Patient Instructions (Addendum)
Aspirin 81 mg daily.  Metformin 500 mg twice daily. After 1 week increase to 1000 mg twice daily.   Check your blood sugar every morning (before breakfast and write it down).  Administer insulin after you check you sugar (with breakfast).  Increase insulin by 2 unit daily until your blood sugars are below 150.  Follow up in 1 week  Take care  Dr. Adriana Simas

## 2017-01-08 NOTE — Progress Notes (Signed)
Pre visit review using our clinic review tool, if applicable. No additional management support is needed unless otherwise documented below in the visit note. 

## 2017-01-09 NOTE — Assessment & Plan Note (Signed)
Advise to quit. Patient not ready.

## 2017-01-09 NOTE — Assessment & Plan Note (Signed)
Discontinuing pain medication. Database reviewed and printed today.

## 2017-01-09 NOTE — Assessment & Plan Note (Signed)
Will plan to start statin in near future (doing things slowly as patient has been very resistant to medication). Starting Aspirin 81 mg today.

## 2017-01-09 NOTE — Progress Notes (Signed)
Subjective:  Patient ID: Laura Small, female    DOB: 1961-01-18  Age: 56 y.o. MRN: [id number]  CC: Follow up   HPI:  56 year old female with uncontrolled DM (has refused treatment), chronic low back pain, HLD, tobacco abuse presents for follow up.  DM-2  Recently Seen in the ED for abdominal pain.  A1c was 14.7.  She has refused treatment.   Will discuss today.  HLD  Uncontrolled.   Needs treatment.  Tobacco abuse  Continues to smoke.  Not ready to quit.  Chronic low back pain  Has been on pain medication.  I have sent her to neurosurgery and PT and bracing was recommend.  She cancelled her pain management appt.  I have informed her previously that I do not prescribe chronic narcotics.  We will discuss this today.  Social Hx   Social History   Social History  . Marital status: Married    Spouse name: N/A  . Number of children: N/A  . Years of education: N/A   Social History Main Topics  . Smoking status: Current Every Day Smoker    Types: Cigarettes  . Smokeless tobacco: Never Used  . Alcohol use No  . Drug use: No  . Sexual activity: Not Currently    Partners: Male   Other Topics Concern  . None   Social History Narrative  . None    Review of Systems  Gastrointestinal: Positive for abdominal pain.  Musculoskeletal: Positive for back pain.   Objective:  BP 124/84   Pulse (!) 103   Temp 97.9 F (36.6 C) (Oral)   Wt 187 lb 8 oz (85 kg)   SpO2 98%   BMI 29.37 kg/m   BP/Weight 01/08/2017 12/16/2016 12/15/2016  Systolic BP 124 133 -  Diastolic BP 84 72 -  Wt. (Lbs) 187.5 - 197  BMI 29.37 - 30.85   Physical Exam  Constitutional: She is oriented to person, place, and time. She appears well-developed. No distress.  Pulmonary/Chest: Effort normal.  Musculoskeletal:  Decreased ROM of back.  Neurological: She is alert and oriented to person, place, and time.  Psychiatric:  Tearful today. Poor insight.  Vitals reviewed.   Lab  Results  Component Value Date   WBC 15.2 (H) 12/15/2016   HGB 16.6 (H) 12/15/2016   HCT 48.2 (H) 12/15/2016   PLT 273 12/15/2016   GLUCOSE 457 (H) 12/15/2016   CHOL 249 (H) 09/08/2016   TRIG 237.0 (H) 09/08/2016   HDL 24.40 (L) 09/08/2016   LDLDIRECT 194.0 09/08/2016   ALT 25 12/15/2016   AST 28 12/15/2016   NA 129 (L) 12/15/2016   K 4.5 12/15/2016   CL 97 (L) 12/15/2016   CREATININE 0.65 12/15/2016   BUN 11 12/15/2016   CO2 16 (L) 12/15/2016   HGBA1C 14.7 (H) 12/15/2016    Assessment & Plan:   Problem List Items Addressed This Visit    Tobacco abuse    Advise to quit. Patient not ready.      Ileitis    Noted on CT during recent ED visit. Concerning for IBD. Needs to see GI (wants to wait given financial constraints).      Hyperlipidemia    Will plan to start statin in near future (doing things slowly as patient has been very resistant to medication). Starting Aspirin 81 mg today.      Relevant Medications   aspirin EC 81 MG tablet   DM (diabetes mellitus), type 2 (HCC)  Uncontrolled and worsening. After lengthy discussion, patient agreed to treatment. Starting metformin and insulin. Patient was given Hospital doctor today (2 pens). Starting 10 units with instructions to increase. Follow up in 1 week with PharmD.      Relevant Medications   metFORMIN (GLUCOPHAGE) 500 MG tablet   Insulin Glargine (BASAGLAR KWIKPEN) 100 UNIT/ML SOPN   aspirin EC 81 MG tablet   Chronic low back pain    Discontinuing pain medication. Database reviewed and printed today.       Relevant Medications   aspirin EC 81 MG tablet   Aortic atherosclerosis (HCC)    Noted on recent CT. Needs to quit smoking and needs aggressive control of DM and lipids.      Relevant Medications   aspirin EC 81 MG tablet      Meds ordered this encounter  Medications  . metFORMIN (GLUCOPHAGE) 500 MG tablet    Sig: Take 1 tablet (500 mg total) by mouth 2 (two) times daily with a meal.     Dispense:  180 tablet    Refill:  3  . Insulin Glargine (BASAGLAR KWIKPEN) 100 UNIT/ML SOPN    Sig: Inject 0.1 mLs (10 Units total) into the skin at bedtime. Increase per instructions    Dispense:  2 pen    Refill:  0  . aspirin EC 81 MG tablet    Sig: Take 1 tablet (81 mg total) by mouth daily.   Follow-up: 1 week.  Everlene Other DO [facility]

## 2017-01-09 NOTE — Assessment & Plan Note (Signed)
Noted on recent CT. Needs to quit smoking and needs aggressive control of DM and lipids.

## 2017-01-09 NOTE — Assessment & Plan Note (Signed)
Uncontrolled and worsening. After lengthy discussion, patient agreed to treatment. Starting metformin and insulin. Patient was given Hospital doctor today (2 pens). Starting 10 units with instructions to increase. Follow up in 1 week with PharmD.

## 2017-01-09 NOTE — Assessment & Plan Note (Signed)
Noted on CT during recent ED visit. Concerning for IBD. Needs to see GI (wants to wait given financial constraints).

## 2017-01-19 ENCOUNTER — Ambulatory Visit (INDEPENDENT_AMBULATORY_CARE_PROVIDER_SITE_OTHER): Payer: Self-pay | Admitting: Pharmacist

## 2017-01-19 ENCOUNTER — Encounter: Payer: Self-pay | Admitting: Pharmacist

## 2017-01-19 DIAGNOSIS — E119 Type 2 diabetes mellitus without complications: Secondary | ICD-10-CM

## 2017-01-19 DIAGNOSIS — E782 Mixed hyperlipidemia: Secondary | ICD-10-CM

## 2017-01-19 MED ORDER — METFORMIN HCL 500 MG PO TABS: 500.0000 mg | ORAL_TABLET | Freq: Two times a day (BID) | ORAL | 3 refills | Status: DC

## 2017-01-19 NOTE — Assessment & Plan Note (Signed)
Diabetes newly diagnosed currently uncontroled. Patient denies hypoglycemic events and is able to verbalize appropriate hypoglycemia management plan.  Patient does report relative hypoglycemia symptoms with CBGs >70 mg/dL. Patient reports adherence with insulin but has not started metformin due to cost. Control is suboptimal due to patient reluctance to start medications, lack of insurance and insulin resistance. Following discussion and approval by Dr Adriana Simas, the following medication changes were made:  -Discussed DM2 disease process, the mechanism and efficacy of metformin and insulin in extensive depth.  Also discussed low carbohydrate diet and exercise.  -Continued basal insulin Lantus (insulin glargine) 40 units. Patient will continue to titrate 2 units every 3 days if fasting CBGs > /dl until fasting CBGs reach goal or next visit.   -Start metformin 500 mg daily x 1 week then increase to 500 mg BID.  Prescription sent to [facility]ication Management clinic for patient assistance.  Instructed patient to contact medication management about medication assistance and income verification that is required.  -Discussed signs/symptoms/treatment of hypoglycemia  Next A1C anticipated 03/2017.

## 2017-01-19 NOTE — Patient Instructions (Addendum)
Continue Basaglar 40 units daily.  May increase Basaglar by 2 units every 3 days if fasting blood glucose greater than 150 over 3 days.   Please go by Medication Management clinic to discuss obtaining your medications for free  Start metformin 500 mg by mouth once daily for 1 week then increase to 500 mg twice daily.  Take with food.   Followup with Hazle Nordmann, PharmD in 2 weeks

## 2017-01-19 NOTE — Progress Notes (Signed)
S:    Chief Complaint  Patient presents with  . Medication Management    Diabetes   Patient arrives in good spirits accompanied by her brother.  Presents for diabetes evaluation, education, and management at the request of Dr Adriana Simas. Patient was referred on 01/08/17.  Patient was last seen by Primary Care Provider on 01/08/17.   Today patient presents with a list of questions about diabetes and her medications.  Patient questions why she needs both metformin and insulin.  After discussing the need for a statin for LDL reduction patient refuses because her "husband had a heart attack after starting a statin."  She agrees she will trial apple cider vinegar to lower LDL for 1 month then will consider a statin at this time.  She is also currently unemployed and looking for a job.  Her brother is also unemployed and looking for a job.  They live in a household of 3 with her husbands social security as the sole income.  She states she does not have enough money to afford metformin regardless of the cost.    Patient reports Diabetes was diagnosed in 2018.   Family/Social History: Mother (Diabetes)  Patient reports adherence with medications.  Current diabetes medications include: metformin 500 mg BID (not taking - cannot afford), Basaglar 40 units daily (prescribed increase by 2 units daily until CBG <150 but has self titrated more aggresively at 2 to 4 units daily)  Patient denies hypoglycemic events. Reports proper treatment knowledge.  She does report relative hypoglycemia symptoms with CBGs >70 mg/dL   Patient reported dietary habits: States she is eating fruits/vegetables and cannot stand the taste of meat currently.  Does report she drinks milk, juice, and bottle green tea   Patient reports nocturia 1x/night (decreased from 5-10 times per night). Denies pain/burning when urinating.    Patient denies neuropathy. Patient reports visual changes with blurred vision.  Patient denies self foot  exams.    O:  Physical Exam  Constitutional: She appears well-developed and well-nourished.     Review of Systems  Constitutional: Negative.      Lab Results  Component Value Date   HGBA1C 14.7 (H) 12/15/2016   Lipid Panel     Component Value Date/Time   CHOL 249 (H) 09/08/2016 1042   TRIG 237.0 (H) 09/08/2016 1042   HDL 24.40 (L) 09/08/2016 1042   CHOLHDL 10 09/08/2016 1042   VLDL 47.4 (H) 09/08/2016 1042   LDLDIRECT 194.0 09/08/2016 1042   Home fasting CBG (4/30-4/19): 125, 188, 277, 223, 312, 322, 351, 437, 316, 391, 396, 600+   A/P: Diabetes newly diagnosed currently uncontroled. Patient denies hypoglycemic events and is able to verbalize appropriate hypoglycemia management plan.  Patient does report relative hypoglycemia symptoms with CBGs >70 mg/dL. Patient reports adherence with insulin but has not started metformin due to cost. Control is suboptimal due to patient reluctance to start medications, lack of insurance and insulin resistance. Following discussion and approval by Dr Adriana Simas, the following medication changes were made:  -Discussed DM2 disease process, the mechanism and efficacy of metformin and insulin in extensive depth.  Also discussed low carbohydrate diet and exercise.  -Continued basal insulin Lantus (insulin glargine) 40 units. Patient will continue to titrate 2 units every 3 days if fasting CBGs > /dl until fasting CBGs reach goal or next visit.   -Start metformin 500 mg daily x 1 week then increase to 500 mg BID.  Prescription sent to [facility]  for patient assistance.  Instructed patient to contact medication management about medication assistance and income verification that is required.  -Discussed signs/symptoms/treatment of hypoglycemia  Next A1C anticipated 03/2017.    ASCVD risk greater than 7.5% with last LDL in 2015 >190. Continued Aspirin 81 mg. Patient refuses statin at this time.  She would like to trial apple  cider vinegar. Discussed efficacy and risks of statin and strongly encouraged her to start.  Will discuss statin at next visit and obtain Lipid panel.    Written patient instructions provided.  Total time in face to face counseling 60  minutes.   Follow up in Pharmacist Clinic Visit in 2 weeks.     Patient was seen with Dr Adriana Simas today in clinic and medication changes were discussed and approved prior to initiation

## 2017-01-19 NOTE — Assessment & Plan Note (Addendum)
ASCVD risk greater than 7.5% with last LDL in 2015 >190. Continued Aspirin 81 mg. Patient refuses statin at this time.  She would like to trial apple cider vinegar. Discussed efficacy and risks of statin and strongly encouraged her to start.  Will discuss statin at next visit and obtain Lipid panel.

## 2017-01-19 NOTE — Progress Notes (Signed)
Care was provided under my supervision. I agree with the management as indicated in the note.  Mayerly Kaman DO  

## 2017-01-22 ENCOUNTER — Telehealth: Payer: Self-pay | Admitting: *Deleted

## 2017-01-22 NOTE — Telephone Encounter (Signed)
Patient would like inform Dr. Adriana Simasook that she is recovering her eye sight , since starting Basaglar.  Patient stated that she's feeling like her old self. She wanted to give a Big Thanks  to Dr. Adriana Simasook for prescribing this medication.  Pt contact [phone]

## 2017-02-02 ENCOUNTER — Ambulatory Visit: Payer: Self-pay | Admitting: Pharmacist

## 2017-02-09 ENCOUNTER — Encounter: Payer: Self-pay | Admitting: Pharmacist

## 2017-02-09 ENCOUNTER — Ambulatory Visit (INDEPENDENT_AMBULATORY_CARE_PROVIDER_SITE_OTHER): Payer: Self-pay | Admitting: Pharmacist

## 2017-02-09 DIAGNOSIS — E119 Type 2 diabetes mellitus without complications: Secondary | ICD-10-CM

## 2017-02-09 DIAGNOSIS — E782 Mixed hyperlipidemia: Secondary | ICD-10-CM

## 2017-02-09 MED ORDER — ROSUVASTATIN CALCIUM 10 MG PO TABS: 10.0000 mg | ORAL_TABLET | ORAL | 1 refills | Status: DC

## 2017-02-09 MED ORDER — INSULIN PEN NEEDLE 32G X 6 MM MISC: 3 refills | Status: DC

## 2017-02-09 MED ORDER — BASAGLAR KWIKPEN 100 UNIT/ML ~~LOC~~ SOPN: 40.0000 [IU] | PEN_INJECTOR | Freq: Every morning | SUBCUTANEOUS | 1 refills | Status: DC

## 2017-02-09 NOTE — Progress Notes (Signed)
S:    Chief Complaint  Patient presents with  . Medication Management    Diabetes   Patient arrives in good spirits accompanied by her brother.  Presents for diabetes evaluation, education, and management at the request of Dr Adriana Simas. Patient was referred on 01/08/17.  Patient was last seen by Primary Care Provider on 01/08/17 and last seen in pharmacy clinic on 01/19/17.   Today patient reports she sas more energy, has decreased hunger/thirst, and she has better eye sight and now is not requiring the use of glasses.   Patient reports Diabetes was diagnosed in 2018.   Family/Social History: Mother Diabetes  Patient reports adherence with medications.  Current diabetes medications include: Lantus 40-46 units daily, metformin 500 mg daily Reports tried metformin 500 mg BID.  States she didn't want to get out of bed so she is only taking metformin 500 mg daily.  She confirms she obtained metformin from [facility] in [place] and has turned in her patient assistance paperwork Current hypertension medications include: none  Patient denies hypoglycemic events. Reports proper treatment knowledge with glucose tablets.    Patient reported dietary habits: Reports eating less grease and less oil.    Patient reported exercise habits: Moving around at home.  No structured exercise.    Patient denies nocturia. Decreased from 7-8 times per night per patient.  Patient denies neuropathy. Patient reports visual changes (improved vision).  Patient reports self foot exams. Denies changes.   Hyperlipidemia Patient willing to try low dose weekly statin today.  She states that she would not like her husband to know about starting the statin.   O:  Physical Exam  Vitals reviewed.  Review of Systems  Constitutional: Negative.    Lab Results  Component Value Date   HGBA1C 14.7 (H) 12/15/2016   Lipid Panel     Component Value Date/Time   CHOL 249 (H) 09/08/2016 1042   TRIG  237.0 (H) 09/08/2016 1042   HDL 24.40 (L) 09/08/2016 1042   CHOLHDL 10 09/08/2016 1042   VLDL 47.4 (H) 09/08/2016 1042   LDLDIRECT 194.0 09/08/2016 1042    Vitals:   02/09/17 1700  BP: (!) 142/82  Pulse: 82   Home fasting CBG: 126, 113, 139, 105, 129, 124, 135, 144, 144, 127, 139, 106, 98, 178, 103, 181, 212, 131, 157   A/P: Diabetes longstanding diagnosed currently uncontrolled but with improved CBGs. Patient denies hypoglycemic events and is able to verbalize appropriate hypoglycemia management plan. Patient reports adherence with medication. Control is suboptimal due to patient reluctance to start medications. Following discussion and approval by Dr Adriana Simas, the following medication changes were made:  -Continued basal insulin Lantus (insulin glargine) 40-46 units daily. Patient will continue to titrate 1 unit/day if fasting CBGs > 150mg /dl until fasting CBGs reach goal or next visit. -Sent Insulin and Pen Needles to [facility] pharmacy so medication assistance paperwork can be completed. -Next A1C anticipated 03/2017.   -Patient requests Thyroid tests.  Will obtain at next visit.   ASCVD risk greater than 7.5% with LDL>190. Continued Aspirin 81 mg and Started rosuvastatin 10 mg weekly. Weekly statin chosen given patient reluctance to start high intensity statin at this time.  Have sent prescription to medication management clinic. Consider titrating statin at next visit  Written patient instructions provided.  Total time in face to face counseling 45 minutes.   Follow up in Pharmacist Clinic Visit in 6 weeks.     Patient was seen with  Dr Adriana Simasook today in clinic and medication changes were discussed and approved prior to initiation

## 2017-02-09 NOTE — Patient Instructions (Addendum)
Continue Basaglar 40-46 units daily.    Start rosuvastatin 10 mg once weekly.  Please call clinic if you experience any muscle pain/weakness  I have sent the prescriptions for your rosuvastatin and insulin to the medication management clinic  Followup with PharmD in 6 weeks

## 2017-02-10 NOTE — Progress Notes (Signed)
Care was provided under my supervision. I agree with the management as indicated in the note.  Sharley Keeler DO  

## 2017-02-10 NOTE — Assessment & Plan Note (Signed)
Diabetes longstanding diagnosed currently uncontrolled but with improved CBGs. Patient denies hypoglycemic events and is able to verbalize appropriate hypoglycemia management plan. Patient reports adherence with medication. Control is suboptimal due to patient reluctance to start medications. Following discussion and approval by Dr Adriana Simasook, the following medication changes were made:  -Continued basal insulin Lantus (insulin glargine) 40-46 units daily. Patient will continue to titrate 1 unit/day if fasting CBGs > 150mg /dl until fasting CBGs reach goal or next visit. -Sent Insulin and Pen Needles to [facility] pharmacy so medication assistance paperwork can be completed. -Next A1C anticipated 03/2017.   -Patient requests Thyroid tests.  Will obtain at next visit.

## 2017-02-10 NOTE — Assessment & Plan Note (Signed)
ASCVD risk greater than 7.5% with LDL>190. Continued Aspirin 81 mg and Started rosuvastatin 10 mg weekly. Weekly statin chosen given patient reluctance to start high intensity statin at this time.  Have sent prescription to medication management clinic. Consider titrating statin at next visit

## 2017-02-10 NOTE — Addendum Note (Signed)
Addended by: Hazle NordmannOMBS, KELSY E on: 02/10/2017 10:44 AM   Modules accepted: Level of Service

## 2017-03-02 ENCOUNTER — Telehealth: Payer: Self-pay | Admitting: *Deleted

## 2017-03-02 ENCOUNTER — Telehealth: Payer: Self-pay | Admitting: Family Medicine

## 2017-03-02 NOTE — Telephone Encounter (Signed)
Yes

## 2017-03-02 NOTE — Telephone Encounter (Signed)
See additional note from LPN to PCP, spoke with patient she will return today to get the sample, thanks

## 2017-03-02 NOTE — Telephone Encounter (Signed)
Patient came into office requesting sample of Basaglar until appointment with Willaim RayasKelsie on 03/09/17 and until medication management can get contact with [employer] to help patient afford medication.

## 2017-03-02 NOTE — Telephone Encounter (Signed)
Patient has requested another sample for Basaglar while she waits on paperwork on medication management to be completed.  Pt contact [phone]

## 2017-03-02 NOTE — Telephone Encounter (Signed)
Okay for me to give? Thanks

## 2017-03-09 ENCOUNTER — Ambulatory Visit: Payer: Self-pay | Admitting: Pharmacy Technician

## 2017-03-09 DIAGNOSIS — Z79899 Other long term (current) drug therapy: Secondary | ICD-10-CM

## 2017-03-09 NOTE — Progress Notes (Signed)
Met with patient completed financial assistance application for [place] due to recent hospital visit.  Patient agreed to be responsible for gathering financial information and forwarding to appropriate department in [place].    Completed [facility] application and contract.  Patient agreed to all terms of the [facility] contract.    Patient approved to receive medication assistance at [facility] through 2018, as long as eligibility criteria continues to be met.  Provided patient with community resource material based on her particular needs.    Basaglar Kwikpen Prescription Application completed with patient.  Forwarded to Jaycee Cook, OD for signature.  Upon receipt of signed application from provider, Basaglar Kwikpen Prescription Application will be submitted to [employer].   J.  Care Manager [facility]  

## 2017-03-12 ENCOUNTER — Other Ambulatory Visit: Payer: Self-pay | Admitting: Family Medicine

## 2017-03-12 ENCOUNTER — Telehealth: Payer: Self-pay | Admitting: Family Medicine

## 2017-03-12 DIAGNOSIS — E119 Type 2 diabetes mellitus without complications: Secondary | ICD-10-CM

## 2017-03-12 MED ORDER — BASAGLAR KWIKPEN 100 UNIT/ML ~~LOC~~ SOPN: 40.0000 [IU] | PEN_INJECTOR | Freq: Every morning | SUBCUTANEOUS | 1 refills | Status: DC

## 2017-03-12 NOTE — Telephone Encounter (Signed)
Refilled. thanks

## 2017-03-12 NOTE — Telephone Encounter (Signed)
Pt called about needing a refill for her insulin pen to make it to March 23, 2017. Insulin Glargine (BASAGLAR KWIKPEN) 100 UNIT/ML SOPN, Insulin Pen Needle 32G X 6 MM MISC.  [facility] [place] - [id number]  Call pt @ [phone]. Thank you!

## 2017-03-12 NOTE — Telephone Encounter (Signed)
Papers from [facility] were dropped off for Dr. Adriana Simasook to complete. Papers are up front in Dr.Cooks color folder.

## 2017-03-12 NOTE — Telephone Encounter (Signed)
Gave forms to CMA for his signature thanks

## 2017-03-13 ENCOUNTER — Other Ambulatory Visit: Payer: Self-pay | Admitting: Family Medicine

## 2017-03-13 DIAGNOSIS — E119 Type 2 diabetes mellitus without complications: Secondary | ICD-10-CM

## 2017-03-13 MED ORDER — BASAGLAR KWIKPEN 100 UNIT/ML ~~LOC~~ SOPN: 40.0000 [IU] | PEN_INJECTOR | Freq: Every morning | SUBCUTANEOUS | 0 refills | Status: DC

## 2017-03-20 ENCOUNTER — Telehealth: Payer: Self-pay | Admitting: Family Medicine

## 2017-03-20 NOTE — Telephone Encounter (Signed)
Patients brother walked in stating he was told he was told to come in for Basaglar sample .  Per verbal orders from Dr Adriana Simasook ok to give sample .  Sample given to patients brother.

## 2017-03-23 ENCOUNTER — Ambulatory Visit (INDEPENDENT_AMBULATORY_CARE_PROVIDER_SITE_OTHER): Payer: Self-pay | Admitting: Pharmacist

## 2017-03-23 ENCOUNTER — Encounter: Payer: Self-pay | Admitting: Pharmacist

## 2017-03-23 DIAGNOSIS — E119 Type 2 diabetes mellitus without complications: Secondary | ICD-10-CM

## 2017-03-23 DIAGNOSIS — E782 Mixed hyperlipidemia: Secondary | ICD-10-CM

## 2017-03-23 MED ORDER — INSULIN GLARGINE 300 UNIT/ML ~~LOC~~ SOPN: 34.0000 [IU] | PEN_INJECTOR | Freq: Every day | SUBCUTANEOUS | 0 refills | Status: DC

## 2017-03-23 NOTE — Progress Notes (Signed)
    S:     Chief Complaint  Patient presents with  . Medication Management    Diabetes, Hyperlipidemia    Patient arrives accompanied by her brother.  Presents for diabetes evaluation, education, and management at the request of Dr Adriana Simasook. Patient was referred on 01/08/2017.  Patient was last seen by Primary Care Provider on 01/08/2017.    Patient reports adherence with medications.  Current diabetes medications include: Lantus 34 units daily (self decreased from 40 units daily after she only received 1 sample pen).    Reports she has been out of rosuvastatin following 4 weeks.  Denies side effects while on rosuvastatin and willing to increase to rosuvastatin 10 mg 2 times per week   Patient denies hypoglycemic events.  Patient reported dietary habits: Reports she has increased her fruit and vegetable intake  Patient reported exercise habits: 1 hour daily of walking (new)   Patient denies nocturia.  Patient denies neuropathy. Patient reports visual changes. She has started to have to use her glasses again.   Patient reports self foot exams. Denies changes   O:  Physical Exam  Constitutional: She appears well-developed.  Vitals reviewed.  Review of Systems  Constitutional: Negative.    Lab Results  Component Value Date   HGBA1C 14.7 (H) 12/15/2016   Lipid Panel     Component Value Date/Time   CHOL 249 (H) 09/08/2016 1042   TRIG 237.0 (H) 09/08/2016 1042   HDL 24.40 (L) 09/08/2016 1042   CHOLHDL 10 09/08/2016 1042   VLDL 47.4 (H) 09/08/2016 1042   LDLDIRECT 194.0 09/08/2016 1042    Vitals:   03/23/17 1601  BP: 125/85  Pulse: 89    Home fasting CBG: 111, 101, 103, 137, 113, 108, 118, 111, 223, 93, 126, 161  A/P: Diabetes longstanding diagnosed currently above goal but with improved fasting CBGs. Patient denies hypoglycemic events and is able to verbalize appropriate hypoglycemia management plan. Patient reports adherence with medication.  Following discussion  and approval by Dr Adriana Simasook, the following medication changes were made:  Continue Insulin Glargine (Basaglar) 34 units daily until finish previous sample then start Insulin Glargine (Toujeo) 34 units daily (sample provided).  Instructed patient to check post prandial and fasting CBGs Patient awaiting response from manufacturer patient assistance program (paperwork being completed by [facility]) Next A1C anticipated at next visit.    ASCVD risk greater than 7.5% with LDL above goal at 194. Continued Aspirin 81 mg and Increased dose of rosuvastatin 10 mg to twice weekly. Patient hesistant to taker higher dose.   Written patient instructions provided.  Total time in face to face counseling 50 minutes.   Follow up in Pharmacist Clinic Visit in 4 weeks.   Patient was seen with Dr Adriana Simasook today in clinic and medication changes were discussed and approved prior to initiation

## 2017-03-23 NOTE — Patient Instructions (Addendum)
Continue Insulin Glargine (Basaglar) 34 units daily until you finish your sample then start Insulin Glargine (Toujeo) 34 units daily   Continue metformin and rosuvastatin.  These are being filled at the [facility]   Followup with Pharmacist in 4 weeks

## 2017-03-24 MED ORDER — ROSUVASTATIN CALCIUM 10 MG PO TABS: 10.0000 mg | ORAL_TABLET | ORAL | 2 refills | Status: DC

## 2017-03-24 NOTE — Assessment & Plan Note (Signed)
ASCVD risk greater than 7.5% with LDL above goal at 194. Continued Aspirin 81 mg and Increased dose of rosuvastatin 10 mg to twice weekly. Patient hesistant to taker higher dose.

## 2017-03-24 NOTE — Progress Notes (Signed)
Care was provided under my supervision. I agree with the management as indicated in the note.  Jaquaveon Bilal DO  

## 2017-03-24 NOTE — Assessment & Plan Note (Signed)
Diabetes longstanding diagnosed currently above goal but with improved fasting CBGs. Patient denies hypoglycemic events and is able to verbalize appropriate hypoglycemia management plan. Patient reports adherence with medication.  Following discussion and approval by Dr Adriana Simasook, the following medication changes were made:  Continue Insulin Glargine (Basaglar) 34 units daily until finish previous sample then start Insulin Glargine (Toujeo) 34 units daily (sample provided).  Instructed patient to check post prandial and fasting CBGs Patient awaiting response from manufacturer patient assistance program (paperwork being completed by [facility]) Next A1C anticipated at next visit.

## 2017-04-20 ENCOUNTER — Encounter: Payer: Self-pay | Admitting: Pharmacist

## 2017-04-20 ENCOUNTER — Ambulatory Visit (INDEPENDENT_AMBULATORY_CARE_PROVIDER_SITE_OTHER): Payer: Self-pay | Admitting: Pharmacist

## 2017-04-20 VITALS — BP 140/81 | HR 83 | Wt 210.2 lb

## 2017-04-20 DIAGNOSIS — E119 Type 2 diabetes mellitus without complications: Secondary | ICD-10-CM

## 2017-04-20 NOTE — Assessment & Plan Note (Signed)
Diabetes longstanding, currently uncontrolled per A1C but improved considering CBG log at this visit. Patient denies hypoglycemic events and is able to verbalize appropriate hypoglycemia management plan. Patient reports adherence with medication. Control is suboptimal due to dietary indiscretion and physical limitations. Patient seems motivated to be off medication for DM in the long term. Following discussion and approval by Dr. Adriana Simasook, the following medication changes were made:   Continued basal insulin Basaglar (insulin glargine) at 40 units once/day Continue taking CBG once daily in the morning Educated on s/sx and proper treatment of hypoglycemia Discussed dietary modifications, My Plate method for portion control, and pathophysiology of DM at length. Explained carefully to patient that there were genetic predispositions given family hx (mother with DM), coupled with lifestyle habits that more likely caused underlying glucose intolerance/DM and that prednisone use likely exacerbated DM.   Next A1C anticipated next month

## 2017-04-20 NOTE — Patient Instructions (Addendum)
Thank you for coming to see me today!   Hang in there, you are doing a GREAT job with your medications and your blood sugar.   Continue taking Basaglar 40 units daily  Continue other medications just like you have been taking them.  Call me if you have any issues with high or low blood sugars.   If you sugar is less than 70, consume 15 grams of fast-acting sugar (small glass of juice, 1/2 a soda, 5 hard candies), wait 15 mins and retest again. If your blood sugar is still less than 70, repeat the 15 grams of sugar. If it is above 70, eat something with protein in it to keep your sugars up.   Watch your carbohydrates and sugars. Portion control is everything. Fill 1/2 of your plate with vegetables, 1/4 with lean proteins, 1/4 with healthy starches.   Schedule a follow up appointment with Dr. Adriana Simasook in 1 month.   Call me if you have ANY issues. My number is [phone].

## 2017-04-20 NOTE — Progress Notes (Signed)
Care was provided under my supervision. I agree with the management as indicated in the note.  Havier Deeb DO  

## 2017-04-20 NOTE — Progress Notes (Signed)
    S:     Chief Complaint  Patient presents with  . Medication Management    Diabetes   Patient arrives accompanied by her brother.  Presents for diabetes evaluation, education, and management at the request of Dr Adriana Simasook. Patient was referred on 01/08/2017.  Patient was last seen by Primary Care Provider on 01/08/2017. Last seen in pharmacy clinic on 03/23/2017. At that appointment, patient was instructed to [facility] after supply exhausted at equivalent dose. Rosuvastatin was increased to 10 mg twice weekly as well.   Patient states that she has self-increased Basaglar to 40 units daily and sugars have been much better. Only taking metformin once/day and crestor once/week as side effects were intolerable. Patient is tearful upon exam and expressed great frustration that people assume she got diabetes because she was not taking care of herself. Patient is under the impression that a short course of prednisone caused DM.   Family/Social History: still smoking 5 cigarettes/day. Expresses significant stressors including finances, pain and martial relationship.   Patient reports adherence with medications.  Current diabetes medications include: Toujeo 40 units daily, metformin 500 mg daily Current hypertension medications include: none  Patient denies hypoglycemic events.  Patient reported dietary habits: Eats 3 meals/day Breakfast: backed chicken (left overs), homefries potatoes  Lunch: none Dinner: cold chicken and homefries (potatoes); onions, peas and carrots Snacks:strawberries, watermelon, crackers, chips Drinks: water, mellow yellow   Patient reported exercise habits: walking 10 dogs, ~1 hr a day, limited by back pain   Patient denies nocturia.  Patient reports neuropathy. Patient reports visual changes. Patient reports self foot exams.   Benedict [email or web address]   O:  Physical Exam   ROS   Lab Results  Component Value Date   HGBA1C 14.7 (H) 12/15/2016    Vitals:   04/20/17 1603  BP: 140/81  Pulse: 83    Home fasting CBG: 100s-130s, excursions to 164, 143 (brought on by self-identified dietary indiscretion)  2 hour post-prandial/random CBG: does not measure.   A/P: Diabetes longstanding, currently uncontrolled per A1C but improved considering CBG log at this visit. Patient denies hypoglycemic events and is able to verbalize appropriate hypoglycemia management plan. Patient reports adherence with medication. Control is suboptimal due to dietary indiscretion and physical limitations. Patient seems motivated to be off medication for DM in the long term. Following discussion and approval by Dr. Adriana Simasook, the following medication changes were made:   Continued basal insulin Basaglar (insulin glargine) at 40 units once/day Continue taking CBG once daily in the morning Educated on s/sx and proper treatment of hypoglycemia Discussed dietary modifications, My Plate method for portion control, and pathophysiology of DM at length. Explained carefully to patient that there were genetic predispositions given family hx (mother with DM), coupled with lifestyle habits that more likely caused underlying glucose intolerance/DM and that prednisone use likely exacerbated DM.   Next A1C anticipated next month  ASCVD risk greater than 7.5%. Continued Aspirin 81 mg and Continued rosuvastatin 20 mg once weekly.   Written patient instructions provided.  Total time in face to face counseling 90 minutes.   Follow up in Pharmacist Clinic Visit 1 month.    Patient was seen with Dr. Adriana Simasook today in clinic and medication changes were discussed and approved prior to initiation  Allena Katzaroline E Ryleah Miramontes, Pharm.D. PGY2 [facility] Resident Phone: [phone]

## 2017-04-21 ENCOUNTER — Telehealth: Payer: Self-pay | Admitting: Pharmacist

## 2017-04-21 NOTE — Telephone Encounter (Signed)
Called patient to follow up on free exercise class resources in her area. No answer. Left HIPAA-compliant message and asked patient to call me back.   Free fitness programs:  [employer] on Saturday  Isack Lavalley E White Shield[place]es, [place]. PGY2 Ambulatory Care Pharmacy Resident Phone: [phone]

## 2017-05-18 ENCOUNTER — Ambulatory Visit (INDEPENDENT_AMBULATORY_CARE_PROVIDER_SITE_OTHER): Payer: Self-pay | Admitting: Pharmacist

## 2017-05-18 ENCOUNTER — Encounter: Payer: Self-pay | Admitting: Pharmacist

## 2017-05-18 DIAGNOSIS — E782 Mixed hyperlipidemia: Secondary | ICD-10-CM

## 2017-05-18 DIAGNOSIS — E119 Type 2 diabetes mellitus without complications: Secondary | ICD-10-CM

## 2017-05-18 LAB — POCT GLYCOSYLATED HEMOGLOBIN (HGB A1C): HEMOGLOBIN A1C: 5.8

## 2017-05-18 MED ORDER — BASAGLAR KWIKPEN 100 UNIT/ML ~~LOC~~ SOPN: 32.0000 [IU] | PEN_INJECTOR | Freq: Every morning | SUBCUTANEOUS | 0 refills | Status: DC

## 2017-05-18 MED ORDER — ROSUVASTATIN CALCIUM 10 MG PO TABS: 10.0000 mg | ORAL_TABLET | ORAL | 3 refills | Status: DC

## 2017-05-18 NOTE — Progress Notes (Signed)
    S:     Chief Complaint  Patient presents with  . Medication Management    Diabetes   Patient arrives ambulating without assistance, accompanied by her brother, Laura Small. Presents for diabetes evaluation, education, and management at the request of Dr Adriana Simas. Patient was referred on 01/08/2017. Patient was last seen by Primary Care Provider on 01/08/2017. Last seen in pharmacy clinic on 04/20/2017. At that appointment, patient was instructed to continue Basaglar 40 units daily and metformin (obtaining through [facility]). Rosuvastatin was continued once weekly because of intolerance.    Patient and Brother both excited at prospect of returning to school. Requests disability letter so that they can pay for internet. Is interested in checking labs today. Excited because she was able to walk 1 mile this week. Reports that she cannot take crestor more than once/week 2/2 GI upset  Patient reports adherence with medications.  Current diabetes medications include: Basaglar 34 units daily (titrates by a few units every few days), metformin 500 mg daily Current hypertension medications include: none  Patient denies hypoglycemic events.  Patient reported dietary habits: Eats 3 meals/day. Uses coconut oil to cook with. Breakfast: 2 biscuits, honey Lunch: PB sandwich Dinner: ham, bologna, swiss cheese sandwich, peas and carrots Snacks: fruits   Patient reported exercise habits: walk 1x a week   Patient denies nocturia.  Patient denies neuropathy. Patient reports visual changes. Patient reports self foot exams. No issues  O:  Physical Exam  Constitutional: She appears well-developed and well-nourished.    Review of Systems  All other systems reviewed and are negative.  Lab Results  Component Value Date   HGBA1C 14.7 (H) 12/15/2016   Vitals:   05/18/17 1600  BP: (!) 143/85  Pulse: 78    Home fasting CBG: 90s-130s, excursions to 150,185  A/P: Diabetes longstanding currently  controlled as evidenced by improved A1C of 5.8% today and CBG log. Patient denies hypoglycemic events and is able to verbalize appropriate hypoglycemia management plan. Patient reports adherence with medication. Following discussion and approval by Dr. Birdie Sons, the following medication changes were made:  -Decrease Basaglar to 32 units daily -Continue metformin 500 mg daily -BMET, A1C today -Next A1C anticipated 08/18/2017  ASCVD risk greater than 7.5%. Continued Aspirin 81 mg and Continued rosuvastatin 10 mg.   Elevated blood pressure as evidenced by reading today. Patient not on any medication and does not officially carry dx of HTN.  -Counseled patient about risks of elevated BP and benefit of treating with medication, dietary improvements, and exercise/weight loss. -Patient very resistant to starting medication - will re-assess at next visit.   Patient was seen with Dr. Birdie Sons today in clinic and medication changes were discussed and approved prior to initiation Written patient instructions provided.  Total time in face to face counseling 45 minutes.   Follow up in Pharmacist Clinic Visit 1 month.   Patient seen with Katherine Mantle, PharmD Candidate.   Allena Katz, Pharm.D. PGY2 Ambulatory Care Pharmacy Resident Phone: [phone]

## 2017-05-18 NOTE — Assessment & Plan Note (Signed)
Diabetes longstanding currently controlled as evidenced by improved A1C of 5.8% today and CBG log. Patient denies hypoglycemic events and is able to verbalize appropriate hypoglycemia management plan. Patient reports adherence with medication. Following discussion and approval by Dr. Birdie Sons, the following medication changes were made:  -Decrease Basaglar to 32 units daily -Continue metformin 500 mg daily -BMET, A1C today -Next A1C anticipated 08/18/2017  ASCVD risk greater than 7.5%. Continued Aspirin 81 mg and Continued rosuvastatin 10 mg.

## 2017-05-18 NOTE — Progress Notes (Signed)
Care was provided under my supervision. I agree with the management as indicated in the note.  Romeka Scifres DO  

## 2017-05-18 NOTE — Patient Instructions (Addendum)
Thanks for coming to see [place] today!   1. We think your sugars look great - we will decrease your Basaglar to 32 units. Continue checking your blood sugar first thing in the morning!  2. Continue your Crestor at least once a week. You can pick it up at [facility].   3. Come back in ~ 1 month  - we will re-evaluate your blood pressure at that time.   4. Labs today - your A1C was 5.8% GREAT job!  My number is [phone] [place])

## 2017-05-19 LAB — BASIC METABOLIC PANEL
BUN: 11 mg/dL (ref 6–23)
CALCIUM: 9.2 mg/dL (ref 8.4–10.5)
CO2: 27 mEq/L (ref 19–32)
Chloride: 104 mEq/L (ref 96–112)
Creatinine, Ser: 0.73 mg/dL (ref 0.40–1.20)
GFR: 88.39 mL/min (ref >60.00)
Glucose, Bld: 140 mg/dL — ABNORMAL HIGH (ref 70–99)
POTASSIUM: 3.4 meq/L — AB (ref 3.5–5.1)
SODIUM: 140 meq/L (ref 135–145)

## 2017-05-20 ENCOUNTER — Other Ambulatory Visit: Payer: Self-pay

## 2017-05-25 ENCOUNTER — Emergency Department: Payer: Self-pay

## 2017-05-25 ENCOUNTER — Encounter: Payer: Self-pay | Admitting: Emergency Medicine

## 2017-05-25 ENCOUNTER — Emergency Department
Admission: EM | Admit: 2017-05-25 | Discharge: 2017-05-26 | Discharge status: Against Medical Advice | Payer: Self-pay | Attending: Emergency Medicine | Admitting: Emergency Medicine

## 2017-05-25 DIAGNOSIS — Z79899 Other long term (current) drug therapy: Secondary | ICD-10-CM | POA: Insufficient documentation

## 2017-05-25 DIAGNOSIS — F17210 Nicotine dependence, cigarettes, uncomplicated: Secondary | ICD-10-CM | POA: Insufficient documentation

## 2017-05-25 DIAGNOSIS — Z794 Long term (current) use of insulin: Secondary | ICD-10-CM | POA: Insufficient documentation

## 2017-05-25 DIAGNOSIS — R079 Chest pain, unspecified: Secondary | ICD-10-CM | POA: Insufficient documentation

## 2017-05-25 DIAGNOSIS — Z7982 Long term (current) use of aspirin: Secondary | ICD-10-CM | POA: Insufficient documentation

## 2017-05-25 DIAGNOSIS — E119 Type 2 diabetes mellitus without complications: Secondary | ICD-10-CM | POA: Insufficient documentation

## 2017-05-25 LAB — CBC
HEMATOCRIT: 42.9 % (ref 35.0–47.0)
HEMOGLOBIN: 14.6 g/dL (ref 12.0–16.0)
MCH: 28.3 pg (ref 26.0–34.0)
MCHC: 34 g/dL (ref 32.0–36.0)
MCV: 83.2 fL (ref 80.0–100.0)
Platelets: 347 10*3/uL (ref 150–440)
RBC: 5.15 MIL/uL (ref 3.80–5.20)
RDW: 14.8 % — AB (ref 11.5–14.5)
WBC: 13.9 10*3/uL — AB (ref 3.6–11.0)

## 2017-05-25 LAB — BASIC METABOLIC PANEL
ANION GAP: 9 (ref 5–15)
BUN: 10 mg/dL (ref 6–20)
CALCIUM: 9.3 mg/dL (ref 8.9–10.3)
CO2: 22 mmol/L (ref 22–32)
Chloride: 107 mmol/L (ref 101–111)
Creatinine, Ser: 0.78 mg/dL (ref 0.44–1.00)
GFR calc Af Amer: >60 mL/min (ref >60)
GLUCOSE: 167 mg/dL — AB (ref 65–99)
POTASSIUM: 3.9 mmol/L (ref 3.5–5.1)
SODIUM: 138 mmol/L (ref 135–145)

## 2017-05-25 LAB — TROPONIN I

## 2017-05-25 MED ORDER — NITROGLYCERIN 0.4 MG SL SUBL
0.4000 mg | SUBLINGUAL_TABLET | SUBLINGUAL | Status: DC | PRN
  Administered 2017-05-25 (×2): 0.4 mg via SUBLINGUAL
  Filled 2017-05-25 (×2): qty 1

## 2017-05-25 NOTE — ED Triage Notes (Addendum)
Pt reports upper CP since 830pm, "like a band around chest" radiating into left side neck/ear and [facility]  ASA x 2 PTA

## 2017-05-26 LAB — TROPONIN I: Troponin I: <0.03 ng/mL (ref <0.03)

## 2017-05-26 NOTE — ED Notes (Signed)
Pt given information about sublingual nitro and reason for giving, pt verbalized understanding of this.

## 2017-05-26 NOTE — ED Notes (Addendum)
ED Provider at bedside. 

## 2017-05-26 NOTE — ED Notes (Addendum)
Pt reports she does "not feel right" after taking 2nd nitro, pt spit out of mouth the remainder of this second nitro. Pt states she can feel pressure in hands since taking second nitro. EDP notified of this. Pt asked if she would like nitro added to allergies, pt states yes. Allergy/sensitivity added to pt's chart.
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
## 2017-05-26 NOTE — Discharge Instructions (Signed)
Please Follow up with your primary care doctor and cardiology for further evaluation of your chest pain tonight.  If you change your mind and wish to continue your evaluation in the hospital, or if you have any new or worsened symptoms, please return to the ER for further assessment.  Results for orders placed or performed during the hospital encounter of 05/25/17  Basic metabolic panel  Result Value Ref Range   Sodium 138 135 - 145 mmol/L   Potassium 3.9 3.5 - 5.1 mmol/L   Chloride 107 101 - 111 mmol/L   CO2 22 22 - 32 mmol/L   Glucose, Bld 167 (H) 65 - 99 mg/dL   BUN 10 6 - 20 mg/dL   Creatinine, Ser 1.610.78 0.44 - 1.00 mg/dL   Calcium 9.3 8.9 - 09.610.3 mg/dL   GFR calc non Af Amer >60 >60 mL/min   GFR calc Af Amer >60 >60 mL/min   Anion gap 9 5 - 15  CBC  Result Value Ref Range   WBC 13.9 (H) 3.6 - 11.0 K/uL   RBC 5.15 3.80 - 5.20 MIL/uL   Hemoglobin 14.6 12.0 - 16.0 g/dL   HCT 04.542.9 40.935.0 - 81.147.0 %   MCV 83.2 80.0 - 100.0 fL   MCH 28.3 26.0 - 34.0 pg   MCHC 34.0 32.0 - 36.0 g/dL   RDW 91.414.8 (H) 78.211.5 - 95.614.5 %   Platelets 347 150 - 440 K/uL  Troponin I  Result Value Ref Range   Troponin I <0.03 <0.03 ng/mL   Dg Chest 2 View  Result Date: 05/25/2017 CLINICAL DATA:  Acute chest pain today. EXAM: CHEST  2 VIEW COMPARISON:  04/03/2016 chest radiograph FINDINGS: The cardiomediastinal silhouette is unremarkable. Mild chronic peribronchial thickening again noted. There is no evidence of focal airspace disease, pulmonary edema, suspicious pulmonary nodule/mass, pleural effusion, or pneumothorax. No acute bony abnormalities are identified. IMPRESSION: No active cardiopulmonary disease. Electronically Signed   By: Harmon PierJeffrey  Hu M.D.   On: 05/25/2017 21:55

## 2017-05-26 NOTE — ED Provider Notes (Signed)
[facility] Provider Note  ____________________________________________  Time seen: Approximately 12:26 AM  I have reviewed the triage vital signs and the nursing notes.   HISTORY  Chief Complaint Chest Pain    HPI Laura Small is a 56 y.o. female who complains of chest pain wrapping around her entire thorax like a big circular band in the mid chest and along the lower ribs, started at 8:30 PM, constant, cystoscopy or shortness of breath and radiating to the left side of the neck and left arm. Described as squeezing, no vomiting or diaphoresis. Not exertional, not pleuritic. Patient tried taking aspirin at home without relief. Pain is moderate intensity. She also reports that she has cramping pain in her legs and arms and really her whole body and she thinks is due to low potassium     Past Medical History:  Diagnosis Date  . Back pain   . Chicken pox   . Diabetes mellitus without complication (HCC)   . Migraines   . PONV (postoperative nausea and vomiting)    usually has 1 episode of vomiting within 1 hour of anesthesia      Patient Active Problem List   Diagnosis Date Noted  . Aortic atherosclerosis (HCC) 01/08/2017  . Tobacco abuse 01/08/2017  . Ileitis 01/08/2017  . DM (diabetes mellitus), type 2 (HCC) 09/23/2016  . Hyperlipidemia 09/23/2016  . Chronic low back pain 09/08/2016     Past Surgical History:  Procedure Laterality Date  . BACK SURGERY    . CHOLECYSTECTOMY    . INSERTION OF MESH N/A 02/12/2015   Procedure: INSERTION OF MESH;  Surgeon: Natale Lay, MD;  Location: [facility] ORS;  Service: General;  Laterality: N/A;  . VENTRAL HERNIA REPAIR N/A 02/12/2015   Procedure: HERNIA REPAIR VENTRAL ADULT;  Surgeon: Natale Lay, MD;  Location: [facility] ORS;  Service: General;  Laterality: N/A;     Prior to Admission medications   Medication Sig Start Date End Date Taking? Authorizing Provider  aspirin EC 81 MG tablet Take 1  tablet (81 mg total) by mouth daily. 01/08/17   Tommie Sams, DO  Insulin Glargine (BASAGLAR KWIKPEN) 100 UNIT/ML SOPN Inject 0.32 mLs (32 Units total) into the skin every morning. Increase per instructions. 05/18/17   Glori Luis, MD  Insulin Pen Needle 32G X 6 MM MISC Use as directed for once daily insulin. E11.9 02/09/17   Tommie Sams, DO  metFORMIN (GLUCOPHAGE) 500 MG tablet Take 1 tablet (500 mg total) by mouth 2 (two) times daily with a meal. Patient taking differently: Take 500 mg by mouth daily with breakfast.  01/19/17   Tommie Sams, DO  rosuvastatin (CRESTOR) 10 MG tablet Take 1 tablet (10 mg total) by mouth once a week. 05/18/17   Glori Luis, MD     Allergies Bee venom; Advil [ibuprofen]; and Prednisone   Family History  Problem Relation Age of Onset  . Diabetes Mother   . Gallbladder disease Mother   . Heart disease Father   . Alcohol abuse Father   . Gallbladder disease Father   . Heart disease Paternal Grandmother   . Colon cancer Neg Hx     Social History Social History  Substance Use Topics  . Smoking status: Current Every Day Smoker    Types: Cigarettes  . Smokeless tobacco: Never Used  . Alcohol use No    Review of Systems  Constitutional:   No fever or chills.  ENT:   No  sore throat. No rhinorrhea. Cardiovascular:   Positive as above chest pain without syncope. Respiratory:   No dyspnea or cough. Gastrointestinal:   Negative for abdominal pain, vomiting and diarrhea.  Musculoskeletal:   Chronic low back pain All other systems reviewed and are negative except as documented above in ROS and HPI.  ____________________________________________   PHYSICAL EXAM:  VITAL SIGNS: ED Triage Vitals  Enc Vitals Group     BP 05/25/17 2230 118/67     Pulse Rate 05/25/17 2245 81     Resp 05/25/17 2230 (!) 23     Temp --      Temp src --      SpO2 05/25/17 2245 99 %     Weight 05/25/17 2125 217 lb (98.4 kg)     Height 05/25/17 2125 5\' 7"   (1.702 m)     Head Circumference --      Peak Flow --      Pain Score 05/25/17 2125 10     Pain Loc --      Pain Edu? --      Excl. in GC? --     Vital signs reviewed, nursing assessments reviewed.   Constitutional:   Alert and oriented. Not in distress. Eyes:   No scleral icterus.  EOMI. No nystagmus. No conjunctival pallor. PERRL. ENT   Head:   Normocephalic and atraumatic.   Nose:   No congestion/rhinnorhea.    Mouth/Throat:   MMM, no pharyngeal erythema. No peritonsillar mass.    Neck:   No meningismus. Full ROM Hematological/Lymphatic/Immunilogical:   No cervical lymphadenopathy. Cardiovascular:   RRR. Symmetric bilateral radial and DP pulses.  No murmurs.  Respiratory:   Normal respiratory effort without tachypnea/retractions. Breath sounds are clear and equal bilaterally. No wheezes/rales/rhonchi. Gastrointestinal:   Soft and nontender. Non distended. There is no CVA tenderness.  No rebound, rigidity, or guarding. Genitourinary:   deferred Musculoskeletal:   Normal range of motion in all extremities. No joint effusions.  No lower extremity tenderness.  No edema. Chest wall nontender Neurologic:   Normal speech and language.  Motor grossly intact. No gross focal neurologic deficits are appreciated.  Skin:    Skin is warm, dry and intact. No rash noted.  No petechiae, purpura, or bullae.  ____________________________________________    LABS (pertinent positives/negatives) (all labs ordered are listed, but only abnormal results are displayed) Labs Reviewed  BASIC METABOLIC PANEL - Abnormal; Notable for the following:       Result Value   Glucose, Bld 167 (*)    All other components within normal limits  CBC - Abnormal; Notable for the following:    WBC 13.9 (*)    RDW 14.8 (*)    All other components within normal limits  TROPONIN I  TROPONIN I   ____________________________________________   EKG  Interpreted by me  Date: 05/26/2017  Rate: 97   Rhythm: normal sinus rhythm  QRS Axis: normal  Intervals: normal  ST/T Wave abnormalities: normal  Conduction Disutrbances: none  Narrative Interpretation: unremarkable      ____________________________________________    RADIOLOGY  Dg Chest 2 View  Result Date: 05/25/2017 CLINICAL DATA:  Acute chest pain today. EXAM: CHEST  2 VIEW COMPARISON:  04/03/2016 chest radiograph FINDINGS: The cardiomediastinal silhouette is unremarkable. Mild chronic peribronchial thickening again noted. There is no evidence of focal airspace disease, pulmonary edema, suspicious pulmonary nodule/mass, pleural effusion, or pneumothorax. No acute bony abnormalities are identified. IMPRESSION: No active cardiopulmonary disease. Electronically Signed   By:  Harmon Pier M.D.   On: 05/25/2017 21:55    ____________________________________________   PROCEDURES Procedures  ____________________________________________   INITIAL IMPRESSION / ASSESSMENT AND PLAN / ED COURSE  Pertinent labs & imaging results that were available during my care of the patient were reviewed by me and considered in my medical decision making (see chart for details).  Patient presents with atypical, nonexertional chest pain. Low suspicion for ACS PE dissection pericarditis or pneumothorax, but with her risk factors plan to get troponin, labs, chest x-ray. She believes that her symptoms are due to hypokalemia.  ----------------------------------------- 12:30 AM on 05/26/2017 -----------------------------------------  Initial workup negative. Pain is unresolved. Patient has had aspirin, trial of nitroglycerin which did not alleviate her pain after one tablet, and she spit out the second one. Recommended that the patient be hospitalized for telemetry monitoring, serial troponins, cardiology evaluation, but she refuses. States that she is going to see her primary care doctor in 2 days, and she can continue to evaluate her symptoms with her  PCP at that time. She cites cost as the major reason why she would not want to be hospitalized at this time. Recommended a second troponin 2 check for any interval change, the patient now states that she would like to be discharged immediately. She has medical decision-making capacity, so we will comply.     ____________________________________________   FINAL CLINICAL IMPRESSION(S) / ED DIAGNOSES  Final diagnoses:  Nonspecific chest pain      New Prescriptions   No medications on file     Portions of this note were generated with dragon dictation software. Dictation errors may occur despite best attempts at proofreading.    Sharman Cheek, MD 05/26/17 [phone]

## 2017-05-26 NOTE — ED Notes (Signed)
This RN introduced self to pt and pt's husband. Pt and husband state they are not happy about waiting without information as far as results and pt continuing to have pain. Husband states nothing has been done to pt and pt is in a lot of pain. This RN apologized for this and go over pt's results at this time, pt concerned about potassium level however this RN notified pt her potassium level is WNL. This RN also states she will talk to the doctor to figure out plan of care and see what can be given for pain. Pt and husband verbally agree to this.

## 2017-05-26 NOTE — ED Notes (Addendum)
Pt reports she is leaving and does not want to stay. Pt states she will allow this RN to get the 2nd troponin and apologizes to this RN for wanting to leave. Erie NoeVanessa RN in room as well at this time talking with pt. Pt states she will let this RN do what she needs to do and all others and MDs can not. Pt states she does not want to stay for results. Pt and husband stating they are leaving. Pt hugged this RN and signed AMA document with Erie NoeVanessa, Charity fundraiserN. Pt was notified it was best to stay for results and follow up and that she is leaving against medical advice and pt states she understands this however can not stay.

## 2017-05-27 ENCOUNTER — Other Ambulatory Visit (INDEPENDENT_AMBULATORY_CARE_PROVIDER_SITE_OTHER): Payer: Self-pay

## 2017-05-27 DIAGNOSIS — E876 Hypokalemia: Secondary | ICD-10-CM

## 2017-05-27 LAB — POTASSIUM: POTASSIUM: 3.8 meq/L (ref 3.5–5.1)

## 2017-06-04 ENCOUNTER — Emergency Department
Admission: EM | Admit: 2017-06-04 | Discharge: 2017-06-04 | Disposition: A | Discharge status: Home / Self Care | Payer: Self-pay | Attending: Emergency Medicine | Admitting: Emergency Medicine

## 2017-06-04 ENCOUNTER — Encounter: Payer: Self-pay | Admitting: Emergency Medicine

## 2017-06-04 DIAGNOSIS — F17210 Nicotine dependence, cigarettes, uncomplicated: Secondary | ICD-10-CM | POA: Insufficient documentation

## 2017-06-04 DIAGNOSIS — M545 Low back pain, unspecified: Secondary | ICD-10-CM

## 2017-06-04 DIAGNOSIS — Z79899 Other long term (current) drug therapy: Secondary | ICD-10-CM | POA: Insufficient documentation

## 2017-06-04 DIAGNOSIS — E119 Type 2 diabetes mellitus without complications: Secondary | ICD-10-CM | POA: Insufficient documentation

## 2017-06-04 DIAGNOSIS — Z7982 Long term (current) use of aspirin: Secondary | ICD-10-CM | POA: Insufficient documentation

## 2017-06-04 DIAGNOSIS — G8929 Other chronic pain: Secondary | ICD-10-CM | POA: Insufficient documentation

## 2017-06-04 DIAGNOSIS — Z794 Long term (current) use of insulin: Secondary | ICD-10-CM | POA: Insufficient documentation

## 2017-06-04 MED ORDER — HYDROCODONE-ACETAMINOPHEN 5-325 MG PO TABS
1.0000 | ORAL_TABLET | Freq: Once | ORAL | Status: AC
  Administered 2017-06-04: 1 via ORAL
  Filled 2017-06-04: qty 1

## 2017-06-04 MED ORDER — CYCLOBENZAPRINE HCL 5 MG PO TABS: ORAL_TABLET | ORAL | 0 refills | Status: DC

## 2017-06-04 MED ORDER — CYCLOBENZAPRINE HCL 10 MG PO TABS
5.0000 mg | ORAL_TABLET | Freq: Once | ORAL | Status: AC
  Administered 2017-06-04: 5 mg via ORAL
  Filled 2017-06-04: qty 1

## 2017-06-04 MED ORDER — HYDROCODONE-ACETAMINOPHEN 5-325 MG PO TABS: 1.0000 | ORAL_TABLET | Freq: Four times a day (QID) | ORAL | 0 refills | Status: DC | PRN

## 2017-06-04 NOTE — Discharge Instructions (Signed)
1. You may take medicines as needed for pain and muscle spasms (Flexeril/Norco #10). 2. Return to the ER for worsening symptoms, persistent vomiting, difficulty breathing or other concerns.

## 2017-06-04 NOTE — ED Notes (Signed)
Pt discharged to home.  Family member driving.  Discharge instructions reviewed.  Verbalized understanding.  No questions or concerns at this time.  Teach back verified.  Pt in NAD.  No items left in ED.   

## 2017-06-04 NOTE — ED Provider Notes (Signed)
[facility] Emergency Department Provider Note   ____________________________________________   First MD Initiated Contact with Patient 06/04/17 [phone]     (approximate)  I have reviewed the triage vital signs and the nursing notes.   HISTORY  Chief Complaint Back Pain    HPI Laura Small is a 56 y.o. female who presents to the ED from home with a chief complaint of acute on chronic low back pain. Patient reports history of low back pain; seen at [facility] spine clinic but was too scared to receive facet injections as recommended. States her PCP has not addressed her pain with pain medication since March. Denies recent falls/trauma/injury. Complains of a one-day history of low back pain radiating to both legs.Denies extremity weakness, numbness/tingling, bowel or bladder incontinence. Denies recent fever, chills, chest pain, shortness of breath, abdominal pain, nausea, vomiting.   Past Medical History:  Diagnosis Date  . Back pain   . Chicken pox   . Diabetes mellitus without complication (HCC)   . Migraines   . PONV (postoperative nausea and vomiting)    usually has 1 episode of vomiting within 1 hour of anesthesia     Patient Active Problem List   Diagnosis Date Noted  . Aortic atherosclerosis (HCC) 01/08/2017  . Tobacco abuse 01/08/2017  . Ileitis 01/08/2017  . DM (diabetes mellitus), type 2 (HCC) 09/23/2016  . Hyperlipidemia 09/23/2016  . Chronic low back pain 09/08/2016    Past Surgical History:  Procedure Laterality Date  . BACK SURGERY    . CHOLECYSTECTOMY    . INSERTION OF MESH N/A 02/12/2015   Procedure: INSERTION OF MESH;  Surgeon: Natale Lay, MD;  Location: [facility] ORS;  Service: General;  Laterality: N/A;  . VENTRAL HERNIA REPAIR N/A 02/12/2015   Procedure: HERNIA REPAIR VENTRAL ADULT;  Surgeon: Natale Lay, MD;  Location: [facility] ORS;  Service: General;  Laterality: N/A;    Prior to Admission medications   Medication Sig Start Date End  Date Taking? Authorizing Provider  aspirin EC 81 MG tablet Take 1 tablet (81 mg total) by mouth daily. 01/08/17   Tommie Sams, DO  cyclobenzaprine (FLEXERIL) 5 MG tablet 1 tablet every 8 hours as needed for muscle spasms 06/04/17   Irean Hong, MD  HYDROcodone-acetaminophen (NORCO) 5-325 MG tablet Take 1 tablet by mouth every 6 (six) hours as needed for moderate pain. 06/04/17   Irean Hong, MD  Insulin Glargine (BASAGLAR KWIKPEN) 100 UNIT/ML SOPN Inject 0.32 mLs (32 Units total) into the skin every morning. Increase per instructions. 05/18/17   Glori Luis, MD  Insulin Pen Needle 32G X 6 MM MISC Use as directed for once daily insulin. E11.9 02/09/17   Tommie Sams, DO  metFORMIN (GLUCOPHAGE) 500 MG tablet Take 1 tablet (500 mg total) by mouth 2 (two) times daily with a meal. Patient taking differently: Take 500 mg by mouth daily with breakfast.  01/19/17   Tommie Sams, DO  rosuvastatin (CRESTOR) 10 MG tablet Take 1 tablet (10 mg total) by mouth once a week. 05/18/17   Glori Luis, MD    Allergies Bee venom; Advil [ibuprofen]; Nitroglycerin; and Prednisone  Family History  Problem Relation Age of Onset  . Diabetes Mother   . Gallbladder disease Mother   . Heart disease Father   . Alcohol abuse Father   . Gallbladder disease Father   . Heart disease Paternal Grandmother   . Colon cancer Neg Hx     Social History  Social History  Substance Use Topics  . Smoking status: Current Every Day Smoker    Types: Cigarettes  . Smokeless tobacco: Never Used  . Alcohol use No    Review of Systems  Constitutional: No fever/chills. Eyes: No visual changes. ENT: No sore throat. Cardiovascular: Denies chest pain. Respiratory: Denies shortness of breath. Gastrointestinal: No abdominal pain.  No nausea, no vomiting.  No diarrhea.  No constipation. Genitourinary: Negative for dysuria. Musculoskeletal: positive for back pain. Skin: Negative for rash. Neurological: Negative for  headaches, focal weakness or numbness.   ____________________________________________   PHYSICAL EXAM:  VITAL SIGNS: ED Triage Vitals  Enc Vitals Group     BP 06/04/17 0128 (!) 146/67     Pulse Rate 06/04/17 0128 95     Resp 06/04/17 0128 18     Temp 06/04/17 0128 97.7 F (36.5 C)     Temp Source 06/04/17 0128 Oral     SpO2 06/04/17 0128 99 %     Weight 06/04/17 0128 217 lb (98.4 kg)     Height 06/04/17 0128 5\' 7"  (1.702 m)     Head Circumference --      Peak Flow --      Pain Score 06/04/17 0127 10     Pain Loc --      Pain Edu? --      Excl. in GC? --     Constitutional: Alert and oriented. Well appearing and in no acute distress. Eyes: Conjunctivae are normal. PERRL. EOMI. Head: Atraumatic. Nose: No congestion/rhinnorhea. Mouth/Throat: Mucous membranes are moist.  Oropharynx non-erythematous. Neck: No stridor.  No cervical spine tenderness to palpation. Cardiovascular: Normal rate, regular rhythm. Grossly normal heart sounds.  Good peripheral circulation. Respiratory: Normal respiratory effort.  No retractions. Lungs CTAB. Gastrointestinal: Soft and nontender. No distention. No abdominal bruits. No CVA tenderness. Musculoskeletal: Midline lumbar spine tenderness with paraspinal muscle spasms. Straight leg raise positive bilaterally at 30. No lower extremity tenderness nor edema.  No joint effusions. Neurologic:  Normal speech and language. No gross focal neurologic deficits are appreciated.  Skin:  Skin is warm, dry and intact. No rash noted. Psychiatric: Mood and affect are normal. Speech and behavior are normal.  ____________________________________________   LABS (all labs ordered are listed, but only abnormal results are displayed)  Labs Reviewed - No data to display ____________________________________________  EKG  none ____________________________________________  RADIOLOGY  No results  found.  ____________________________________________   PROCEDURES  Procedure(s) performed: None  Procedures  Critical Care performed: No  ____________________________________________   INITIAL IMPRESSION / ASSESSMENT AND PLAN / ED COURSE  Pertinent labs & imaging results that were available during my care of the patient were reviewed by me and considered in my medical decision making (see chart for details).  56 year old female with acute on chronic low back pain. No focal neurological deficits noted on examination. Reviewed [place] narcotics database; patient has not been dispense anything since March 2018. Will administer analgesia, muscle relaxer and discharged home with limited quantity. Patient and spouse desire second opinion for her chronic back pain; [facility] Of note, patient requests a note for court this morning. Strict return precautions given. Both verbalize understanding and agree with plan of care.      ____________________________________________   FINAL CLINICAL IMPRESSION(S) / ED DIAGNOSES  Final diagnoses:  Acute midline low back pain without sciatica  Chronic midline low back pain without sciatica      NEW MEDICATIONS STARTED DURING THIS VISIT:  New Prescriptions  CYCLOBENZAPRINE (FLEXERIL) 5 MG TABLET    1 tablet every 8 hours as needed for muscle spasms   HYDROCODONE-ACETAMINOPHEN (NORCO) 5-325 MG TABLET    Take 1 tablet by mouth every 6 (six) hours as needed for moderate pain.     Note:  This document was prepared using Dragon voice recognition software and may include unintentional dictation errors.    Irean Hong, MD 06/04/17 [phone]

## 2017-06-04 NOTE — ED Triage Notes (Signed)
Pt to triage via w/c with no distress noted; pt c/o lower back/hip pain tonight since November but PCP "will not address it"

## 2017-06-15 ENCOUNTER — Ambulatory Visit (INDEPENDENT_AMBULATORY_CARE_PROVIDER_SITE_OTHER): Payer: Self-pay | Admitting: Pharmacist

## 2017-06-15 ENCOUNTER — Encounter: Payer: Self-pay | Admitting: Pharmacist

## 2017-06-15 DIAGNOSIS — E119 Type 2 diabetes mellitus without complications: Secondary | ICD-10-CM

## 2017-06-15 MED ORDER — METFORMIN HCL ER 500 MG PO TB24: 500.0000 mg | ORAL_TABLET | Freq: Two times a day (BID) | ORAL | 3 refills | Status: DC

## 2017-06-15 MED ORDER — BASAGLAR KWIKPEN 100 UNIT/ML ~~LOC~~ SOPN: 40.0000 [IU] | PEN_INJECTOR | Freq: Every morning | SUBCUTANEOUS | 6 refills | Status: DC

## 2017-06-15 NOTE — Progress Notes (Signed)
S:     Chief Complaint  Patient presents with  . Medication Management    Diabetes    Patient arrives ambulating with assistance of wheelchair to "preserve walk endurance", accompanied by her brother, Katherina Right. Presents for diabetes evaluation, education, and management at the request of Dr Adriana Simas. Patient was referred on 01/08/2017. Patient was last seen by Primary Care Provider on 01/08/2017. Last seen in pharmacy clinic on 05/18/2017. At that appointment, patient was instructed to decrease Basaglar to 32 units daily and continue metformin (obtaining through [employer]). Rosuvastatin was continued once weekly because of intolerance.  Has been to the ED twice since last visit, once for chest pain - determined to be non-specific; and again for back pain, given flexeril, norco.   Patient states that her CBGs have been higher so she has increased her insulin back to 38-44 unit range. Attributes increase in CBGs to back pain. Reports that when her CBGs are high she increases insulin and takes metformin twice a day. Unfortunately, experiences diarrhea when taking metformin BID so usually takes once daily. Has never tried XR formulation before.   Down to smoking 5-7 cigarettes/day, smoking a pack a day last year. Not interested in quitting at this time.  Patient reports adherence with medications.  Current diabetes medications include: Basaglar 38-44 units daily, metformin 500 mg daily  Patient denies hypoglycemic events.  Has greatly decreased soda intake, down to 1 can of mellow yellow/day. Brother does the cooking.   Patient reported exercise habits: none now, limited by back pain   O:  Physical Exam  Constitutional: She appears well-developed and well-nourished.  Vitals reviewed.  Review of Systems  All other systems reviewed and are negative.  Lab Results  Component Value Date   HGBA1C 5.8 05/18/2017   Vitals:   06/15/17 1504  BP: (!) 143/86  Pulse: 80    Home fasting CBG:  110s-140s, excersions to 176, 107.  2 hour post-prandial/random CBG: does not check  A/P: Diabetes longstanding currently controlled per A1C and CBG report with some excursions out of control 2/2 pain/stress. Patient denies hypoglycemic events and is able to verbalize appropriate hypoglycemia management plan. Patient reports adherence with medication. Control is suboptimal due to stress, pain. Following discussion and approval by Dr. Birdie Sons, the following medication changes were made:  Continued basal insulin Basaglar (insulin glargine) ~ 40 units daily. Patient will continue to titrate 1 unit/day if fasting CBGs > /dl until fasting CBGs reach goal or next visit.   Change metformin to XR 500 mg daily x 1 week, then increase to BID as tolerated Asked patient to start checking CBGs 2 hrs after meals Next A1C anticipated 08/18/2017 or later.    ASCVD risk greater than 7.5%. Continued Aspirin 81 mg and Continued rosuvastatin 20 mg once weekly as this is the maximum she is able to tolerate.   Elevated blood pressure without diagnosis of hypertension. BP likely elevated 2/2 pain. Patient resistant to medications at this point and requests that we hold off until back pain is addressed at PCP visit -Schedule PCP visit ASAP -Advised of long term complications of uncontrolled  -follow up at next visit  Patient was seen with Dr. Birdie Sons today in clinic and medication changes were discussed and approved prior to initiation Written patient instructions provided.  Total time in face to face counseling 40 mins minutes.   Follow up in Pharmacist Clinic Visit 1 month, f/u with PCP ASAP.   Patient seen with Durward Mallard, PharmD  Candidate

## 2017-06-15 NOTE — Patient Instructions (Signed)
Thank you for coming to see Laura Small today! Sorry to hear about your back pain. The most important thing at this point is to see a PCP.   1. Change metformin immediate release to the XR formulation. Start with metformin XR 500 mg 1 tablet once a day. Then increase to 1 tablet twice a day as able.   2. Continue adjusting your insulin like you have been, ~40 units once a day  3. We MUST talk about blood pressure medication if it remains elevated. We are trying to prevent damage to your kidneys, heart, blood vessels, eyes.   4. Add in a few blood sugar checks ~2hours after you have eaten a meal. Write then down in your book and bring to next visit.   Schedule an appointment with a PCP as soon as able. Come back to see Laura Small in ~ 1 month and call for anything! [phone].

## 2017-06-15 NOTE — Assessment & Plan Note (Signed)
Diabetes longstanding currently controlled per A1C and CBG report with some excursions out of control 2/2 pain/stress. Patient denies hypoglycemic events and is able to verbalize appropriate hypoglycemia management plan. Patient reports adherence with medication. Control is suboptimal due to stress, pain. Following discussion and approval by Dr. Birdie Sons, the following medication changes were made:  Continued basal insulin Basaglar (insulin glargine) ~ 40 units daily. Patient will continue to titrate 1 unit/day if fasting CBGs > /dl until fasting CBGs reach goal or next visit.   Change metformin to XR 500 mg daily x 1 week, then increase to BID as tolerated Asked patient to start checking CBGs 2 hrs after meals Next A1C anticipated 08/18/2017 or later.    ASCVD risk greater than 7.5%. Continued Aspirin 81 mg and Continued rosuvastatin 20 mg once weekly as this is the maximum she is able to tolerate.

## 2017-06-18 ENCOUNTER — Encounter: Payer: Self-pay | Admitting: Emergency Medicine

## 2017-06-18 ENCOUNTER — Emergency Department: Payer: Self-pay

## 2017-06-18 ENCOUNTER — Emergency Department
Admission: EM | Admit: 2017-06-18 | Discharge: 2017-06-18 | Disposition: A | Discharge status: Home / Self Care | Payer: Self-pay | Attending: Emergency Medicine | Admitting: Emergency Medicine

## 2017-06-18 ENCOUNTER — Telehealth: Payer: Self-pay | Admitting: Family Medicine

## 2017-06-18 DIAGNOSIS — I700 Atherosclerosis of aorta: Secondary | ICD-10-CM | POA: Insufficient documentation

## 2017-06-18 DIAGNOSIS — G8929 Other chronic pain: Secondary | ICD-10-CM | POA: Insufficient documentation

## 2017-06-18 DIAGNOSIS — E119 Type 2 diabetes mellitus without complications: Secondary | ICD-10-CM | POA: Insufficient documentation

## 2017-06-18 DIAGNOSIS — Z794 Long term (current) use of insulin: Secondary | ICD-10-CM | POA: Insufficient documentation

## 2017-06-18 DIAGNOSIS — Z79899 Other long term (current) drug therapy: Secondary | ICD-10-CM | POA: Insufficient documentation

## 2017-06-18 DIAGNOSIS — M545 Low back pain, unspecified: Secondary | ICD-10-CM

## 2017-06-18 DIAGNOSIS — Z7982 Long term (current) use of aspirin: Secondary | ICD-10-CM | POA: Insufficient documentation

## 2017-06-18 MED ORDER — OXYCODONE HCL 5 MG PO TABS
5.0000 mg | ORAL_TABLET | Freq: Once | ORAL | Status: AC
  Administered 2017-06-18: 5 mg via ORAL
  Filled 2017-06-18: qty 1

## 2017-06-18 MED ORDER — ACETAMINOPHEN 325 MG PO TABS
650.0000 mg | ORAL_TABLET | Freq: Once | ORAL | Status: AC
  Administered 2017-06-18: 650 mg via ORAL
  Filled 2017-06-18: qty 2

## 2017-06-18 MED ORDER — MORPHINE SULFATE (PF) 4 MG/ML IV SOLN
INTRAVENOUS | Status: AC
  Filled 2017-06-18: qty 1

## 2017-06-18 MED ORDER — OXYCODONE-ACETAMINOPHEN 5-325 MG PO TABS
1.0000 | ORAL_TABLET | Freq: Once | ORAL | Status: AC
  Administered 2017-06-18: 1 via ORAL
  Filled 2017-06-18: qty 1

## 2017-06-18 MED ORDER — MORPHINE SULFATE (PF) 4 MG/ML IV SOLN
4.0000 mg | Freq: Once | INTRAVENOUS | Status: AC
  Administered 2017-06-18: 4 mg via INTRAVENOUS

## 2017-06-18 NOTE — ED Triage Notes (Addendum)
Pt arrived to triage with rapid RR, sweating and sts "I cant do this, somebody needs to help me. This pain keeps getting worse and no one will do a damn thing about it." Pts brother reports pt suffers from chronic lumbar back pain and has not been able to get pain management until seeing her PCP on October 9. Pt at a 10/10 pain. Pt coached to slow breathing due to hyperventilation. Pt crying and getting louder with words in triage.

## 2017-06-18 NOTE — Telephone Encounter (Signed)
Medication management dropped off form from [facility] to be filled out and call [phone] when ready. Handed to [employer]

## 2017-06-18 NOTE — Telephone Encounter (Signed)
On your desk to sign

## 2017-06-18 NOTE — Discharge Instructions (Signed)
You have been seen in the Emergency Department (ED)  today for back pain.  Back pain has many possible causes some are related to muscles while others have more serious causes. Even though you were checked carefully today and your exam and evaluation were reassuring, problems may develop later or continue to unfold. Therefore it is imperative that you follow up with doctor closely for further evaluation.  Follow-up with your doctor in 1 day for further evaluation.   When should you call for help?  Call your doctor now or seek immediate medical care if:  You have new or worsening numbness in your legs.  You have new or worsening weakness in your legs. (This could make it hard to stand up.)  You lose control of your bladder or bowels or if you are unable to urinate. You have numbness of your groin or buttock region If you develop a fever  Watch closely for changes in your health, and be sure to contact your doctor if:  Your pain gets worse.  You are not getting better after 2 weeks.  How can you care for yourself at home?  Take pain medicines exactly as directed.  If the doctor gave you a prescription medicine for pain, take it as prescribed.  If you are not taking a prescription pain medicine, ask your doctor if you can take an over-the-counter medicine like tylenol or ibuprofen. Sit or lie in positions that are most comfortable and reduce your pain. Try one of these positions when you lie down:  Lie on your back with your knees bent and supported by large pillows.  Lie on the floor with your legs on the seat of a sofa or chair.  Lie on your side with your knees and hips bent and a pillow between your legs.  Lie on your stomach if it does not make pain worse. Do not sit up in bed, and avoid soft couches and twisted positions. Bed rest can help relieve pain at first, but it delays healing. Avoid bed rest after the first day of back pain.  Change positions every 30 minutes. If you must sit  for long periods of time, take breaks from sitting. Get up and walk around, or lie in a comfortable position.  Try using a heating pad on a low or medium setting for 15 to 20 minutes every 2 or 3 hours. Try a warm shower in place of one session with the heating pad.  You can also try an ice pack for 10 to 15 minutes every 2 to 3 hours. Put a thin cloth between the ice pack and your skin.  Take short walks several times a day. You can start with 5 to 10 minutes, 3 or 4 times a day, and work up to longer walks. Walk on level surfaces and avoid hills and stairs until your back is better.  Return to work and other activities as soon as you can. Continued rest without activity is usually not good for your back.  To prevent future back pain, do exercises to stretch and strengthen your back and stomach. Learn how to use good posture, safe lifting techniques, and proper body mechanics.  

## 2017-06-18 NOTE — Progress Notes (Signed)
Care was provided under my supervision. I agree with the management as indicated in the note.  Malayjah Otoole DO  

## 2017-06-18 NOTE — ED Notes (Signed)
Patient transported to X-ray 

## 2017-06-18 NOTE — ED Provider Notes (Signed)
[facility] Emergency Department Provider Note  ____________________________________________  Time seen: Approximately 5:22 AM  I have reviewed the triage vital signs and the nursing notes.   HISTORY  Chief Complaint Back Pain   HPI Laura Small is a 56 y.o. female with a history of chronic back pain who presents for evaluation of an exacerbation of her pain. Patient has had several visits to the emergency room for the same complaints. Patient seen here2 weeks ago and prescribed Norco. She is waiting to establish care with a new primary care doctor to be referred to pain clinic. She has had this pain for a year. She had an MRI in February and denies any changes in the pain since then. She complains of the pain is located midline and paraspinal in the lumbar area, radiating to bilateral lower extremities, sharp, currently severe. Patient denies saddle anesthesia. patient reports that the pain was so severe today that she was trying to get to the bathroom to urinate and ended up urinating on herself before getting to the bathroom. No dysuria, fever, abdominal pain, no falls.  Past Medical History:  Diagnosis Date  . Back pain   . Chicken pox   . Diabetes mellitus without complication (HCC)   . Migraines   . PONV (postoperative nausea and vomiting)    usually has 1 episode of vomiting within 1 hour of anesthesia     Patient Active Problem List   Diagnosis Date Noted  . Aortic atherosclerosis (HCC) 01/08/2017  . Tobacco abuse 01/08/2017  . Ileitis 01/08/2017  . DM (diabetes mellitus), type 2 (HCC) 09/23/2016  . Hyperlipidemia 09/23/2016  . Chronic low back pain 09/08/2016    Past Surgical History:  Procedure Laterality Date  . BACK SURGERY    . CHOLECYSTECTOMY    . INSERTION OF MESH N/A 02/12/2015   Procedure: INSERTION OF MESH;  Surgeon: Natale Lay, MD;  Location: [facility] ORS;  Service: General;  Laterality: N/A;  . VENTRAL HERNIA REPAIR N/A  02/12/2015   Procedure: HERNIA REPAIR VENTRAL ADULT;  Surgeon: Natale Lay, MD;  Location: [facility] ORS;  Service: General;  Laterality: N/A;    Prior to Admission medications   Medication Sig Start Date End Date Taking? Authorizing Provider  aspirin EC 81 MG tablet Take 1 tablet (81 mg total) by mouth daily. 01/08/17   Tommie Sams, DO  Insulin Glargine (BASAGLAR KWIKPEN) 100 UNIT/ML SOPN Inject 0.4 mLs (40 Units total) into the skin every morning. Increase per instructions. 06/15/17   Glori Luis, MD  Insulin Pen Needle 32G X 6 MM MISC Use as directed for once daily insulin. E11.9 02/09/17   Tommie Sams, DO  metFORMIN (GLUCOPHAGE XR) 500 MG 24 hr tablet Take 1 tablet (500 mg total) by mouth 2 (two) times daily. 06/15/17   Glori Luis, MD  rosuvastatin (CRESTOR) 10 MG tablet Take 1 tablet (10 mg total) by mouth once a week. 05/18/17   Glori Luis, MD    Allergies Bee venom; Advil [ibuprofen]; Nitroglycerin; and Prednisone  Family History  Problem Relation Age of Onset  . Diabetes Mother   . Gallbladder disease Mother   . Heart disease Father   . Alcohol abuse Father   . Gallbladder disease Father   . Heart disease Paternal Grandmother   . Colon cancer Neg Hx     Social History Social History  Substance Use Topics  . Smoking status: Current Every Day Smoker    Types: Cigarettes  .  Smokeless tobacco: Never Used  . Alcohol use No    Review of Systems  Constitutional: Negative for fever. Eyes: Negative for visual changes. ENT: Negative for sore throat. Neck: No neck pain  Cardiovascular: Negative for chest pain. Respiratory: Negative for shortness of breath. Gastrointestinal: Negative for abdominal pain, vomiting or diarrhea. Genitourinary: Negative for dysuria. Musculoskeletal: + back pain. Skin: Negative for rash. Neurological: Negative for headaches, weakness or numbness. Psych: No SI or HI  ____________________________________________   PHYSICAL  EXAM:  VITAL SIGNS: ED Triage Vitals  Enc Vitals Group     BP 06/18/17 0137 (!) 169/101     Pulse Rate 06/18/17 0137 (!) 126     Resp 06/18/17 0137 (!) 24     Temp 06/18/17 0137 97.9 F (36.6 C)     Temp Source 06/18/17 0137 Oral     SpO2 06/18/17 0137 98 %     Weight 06/18/17 0137 219 lb (99.3 kg)     Height --      Head Circumference --      Peak Flow --      Pain Score 06/18/17 0430 10     Pain Loc --      Pain Edu? --      Excl. in GC? --     Constitutional: Alert and oriented, no distress.  HEENT:      Head: Normocephalic and atraumatic.         Eyes: Conjunctivae are normal. Sclera is non-icteric.       Mouth/Throat: Mucous membranes are moist.       Neck: Supple with no signs of meningismus. Cardiovascular: Regular rate and rhythm. No murmurs, gallops, or rubs. 2+ symmetrical distal pulses are present in all extremities. No JVD. Respiratory: Normal respiratory effort. Lungs are clear to auscultation bilaterally. No wheezes, crackles, or rhonchi.  Gastrointestinal: Soft, non tender, and non distended with positive bowel sounds. No rebound or guarding. Musculoskeletal: Nontender with normal range of motion in all extremities. No edema, cyanosis, or erythema of extremities. Neurologic: Normal sensation on b/l legs, 2+ DTRs on b/l patella. Motor exam limited due to pain but patient is able to stand up. Normal rectal tone and sensation Skin: Skin is warm, dry and intact. No rash noted. Psychiatric: Mood and affect are normal. Speech and behavior are normal.  ____________________________________________   LABS (all labs ordered are listed, but only abnormal results are displayed)  Labs Reviewed - No data to display ____________________________________________  EKG  none ____________________________________________  RADIOLOGY  XR lumbar spine:  Degenerative disc disease and facet arthropathy. No acute  osseous abnormality. ____________________________________________   PROCEDURES  Procedure(s) performed: None Procedures Critical Care performed:  None ____________________________________________   INITIAL IMPRESSION / ASSESSMENT AND PLAN / ED COURSE   56 y.o. female with a history of chronic back pain who presents for evaluation of an exacerbation of her back pain. I explained to the patient that we are unable to provide several prescriptions for chronic pain exacerbation and that since she received one 2 weeks ago from [place], I would not be prescribing her any more opiates unless a new problem is identified. XR lumbar showing no acute findings. Neuro exam normal with intact DTRs, normal rectal tone and sensation. At this time clinically there is no evidence of cauda equina. Patient had MRI in 10/2016 showing Multilevel degenerative endplate changes and normal cord and conus medullaris and patient reports that this pain is ongoing for 1 year therefore do not believe patient warrants. Patient  reports that her pain is significantly improved with one dose of IM morphine. Will dc home. Recommended f/u with her orthopedic surgeon. Discussed signs and symptoms of cauda equina and recommended emergent evaluation if these develop.     Pertinent labs & imaging results that were available during my care of the patient were reviewed by me and considered in my medical decision making (see chart for details).    ____________________________________________   FINAL CLINICAL IMPRESSION(S) / ED DIAGNOSES  Final diagnoses:  Chronic bilateral low back pain without sciatica      NEW MEDICATIONS STARTED DURING THIS VISIT:  New Prescriptions   No medications on file     Note:  This document was prepared using Dragon voice recognition software and may include unintentional dictation errors.    Don Perking, [place], MD 06/18/17 [phone]

## 2017-06-19 NOTE — Telephone Encounter (Signed)
Called number to notify, placed at front

## 2017-06-19 NOTE — Telephone Encounter (Signed)
Signed.

## 2017-06-26 ENCOUNTER — Telehealth: Payer: Self-pay | Admitting: Pharmacist

## 2017-06-26 NOTE — Telephone Encounter (Signed)
06/26/17 Faxed [employer] refill request for [employer] Inject 40-46 units under the skin every morning, Max daily dose 46 units. Patient is now seeing Dr. Marikay Alar @ [facility] [address]

## 2017-06-30 ENCOUNTER — Ambulatory Visit (INDEPENDENT_AMBULATORY_CARE_PROVIDER_SITE_OTHER): Payer: Self-pay | Admitting: Family Medicine

## 2017-06-30 ENCOUNTER — Encounter: Payer: Self-pay | Admitting: Family Medicine

## 2017-06-30 VITALS — BP 150/90 | HR 88 | Temp 98.7°F | Wt 220.6 lb

## 2017-06-30 DIAGNOSIS — E119 Type 2 diabetes mellitus without complications: Secondary | ICD-10-CM

## 2017-06-30 DIAGNOSIS — Z720 Tobacco use: Secondary | ICD-10-CM

## 2017-06-30 DIAGNOSIS — M5136 Other intervertebral disc degeneration, lumbar region: Secondary | ICD-10-CM

## 2017-06-30 DIAGNOSIS — M5441 Lumbago with sciatica, right side: Secondary | ICD-10-CM

## 2017-06-30 DIAGNOSIS — M5442 Lumbago with sciatica, left side: Secondary | ICD-10-CM

## 2017-06-30 DIAGNOSIS — G8929 Other chronic pain: Secondary | ICD-10-CM

## 2017-06-30 NOTE — Progress Notes (Signed)
  Marikay Alar, MD Phone: [phone]  Laura Small is a 56 y.o. female who presents today for follow-up.  Chronic low back pain: Patient has continued to have issues with chronic low back pain over the last year. She had an MRI earlier this year that revealed degenerative changes. Had an x-ray recently with similar findings when she was in the emergency room for low back pain. The only thing that has been beneficial are narcotics. Tramadol was not beneficial. Anti-inflammatories have not been beneficial. She notes no numbness. Feels as though her legs are weak related to the pain. No saddle anesthesia. No incontinence. She declines physical therapy.  Diabetes: CBGs running between 93 and 173. Taking basiglar typically around 40 units though occasionally drops down to 28 units. Taking metformin. No hypoglycemia. No polyuria or polydipsia.  Tobacco abuse: She is down to 2 cigarettes daily. She has not used nicotine replacement. She tries to distract herself at the computer. She hopes to quit next summer.   PMH: Smoker   ROS see history of present illness  Objective  Physical Exam Vitals:   06/30/17 1109  BP: (!) 150/90  Pulse: 88  Temp: 98.7 F (37.1 C)  SpO2: 98%    BP Readings from Last 3 Encounters:  06/30/17 (!) 150/90  06/18/17 (!) 144/76  06/15/17 (!) 143/86   Wt Readings from Last 3 Encounters:  06/30/17 220 lb 9.6 oz (100.1 kg)  06/18/17 219 lb (99.3 kg)  06/15/17 219 lb 9.6 oz (99.6 kg)    Physical Exam  Constitutional: No distress.  Cardiovascular: Normal rate, regular rhythm and normal heart sounds.   Pulmonary/Chest: Effort normal and breath sounds normal.  Musculoskeletal:  Midline lumbar tenderness and paraspinous muscular tenderness with no overlying skin changes, no other tenderness in her back  Neurological:  5/5 strength bilateral quads, hamstrings, plantar flexion, and dorsiflexion, sensation to light touch intact bilaterally lower extremities    Skin: She is not diaphoretic.     Assessment/Plan: Please see individual problem list.  Chronic low back pain Patient with chronic midline low back pain. Not currently taking any medication for this. She's been seen in the ED multiple times to receive pain medication. I discussed that chronic narcotics are not a recommended treatment for chronic low back pain. She declined physical therapy referral. We'll refer to neurosurgery at her request and pain management. She did have an MRI earlier this year and a recent x-ray.  DM (diabetes mellitus), type 2 (HCC) Has had improved control. She'll continue to monitor her sugars. Continue insulin and metformin.  Tobacco abuse Still smoking 2 cigarettes a day. Advised smoking cessation.   Orders Placed This Encounter  Procedures  . Ambulatory referral to Neurology    Referral Priority:   Routine    Referral Type:   Consultation    Referral Reason:   Specialty Services Required    Requested Specialty:   Neurology    Number of Visits Requested:   1  . Ambulatory referral to Pain Clinic    Referral Priority:   Routine    Referral Type:   Consultation    Referral Reason:   Specialty Services Required    Requested Specialty:   Pain Medicine    Number of Visits Requested:   1    Marikay Alar, MD [facility]

## 2017-06-30 NOTE — Assessment & Plan Note (Signed)
Still smoking 2 cigarettes a day. Advised smoking cessation.

## 2017-06-30 NOTE — Assessment & Plan Note (Signed)
Has had improved control. She'll continue to monitor her sugars. Continue insulin and metformin.

## 2017-06-30 NOTE — Patient Instructions (Signed)
Nice to see you. We'll refer you to neurosurgery and pain management for your back. Please monitor your sugars. Please quit smoking.

## 2017-06-30 NOTE — Assessment & Plan Note (Signed)
Patient with chronic midline low back pain. Not currently taking any medication for this. She's been seen in the ED multiple times to receive pain medication. I discussed that chronic narcotics are not a recommended treatment for chronic low back pain. She declined physical therapy referral. We'll refer to neurosurgery at her request and pain management. She did have an MRI earlier this year and a recent x-ray.

## 2017-07-02 ENCOUNTER — Telehealth: Payer: Self-pay | Admitting: Family Medicine

## 2017-07-02 NOTE — Telephone Encounter (Signed)
I agree with further evaluation if her pain is as bad as they state. She could go to an urgent care to be evaluated. I certainly would not advise drinking alcohol for the pain. She may benefit from a prescription strength anti-inflammatory, though with the severity of her pain today I would suggest evaluation at urgent care. Thanks.

## 2017-07-02 NOTE — Telephone Encounter (Signed)
Patients brother called stating that patient is having a lot of back pain and the only thing that gives her relief is alcohol. This is a chronic issue that she has been in the ED for 3 times in September. He says that they are in the process of getting set up with the pain clinic. Advised them that you were out of the office this afternoon and none of the providers will write narcotics without office visit. Advised that if patient was in excruciating pain she should go to urgent care or ED for further evaluation.

## 2017-07-02 NOTE — Telephone Encounter (Signed)
Pt brother Katherina Right called and stated that pt is having a lot of back pain today. The only relief she is getting is from alcohol. Please advise, thank you!  Call [place] @ [phone]

## 2017-07-20 ENCOUNTER — Ambulatory Visit (INDEPENDENT_AMBULATORY_CARE_PROVIDER_SITE_OTHER): Payer: Self-pay | Admitting: Pharmacist

## 2017-07-20 ENCOUNTER — Encounter: Payer: Self-pay | Admitting: Pharmacist

## 2017-07-20 DIAGNOSIS — E119 Type 2 diabetes mellitus without complications: Secondary | ICD-10-CM

## 2017-07-20 MED ORDER — EMPAGLIFLOZIN 10 MG PO TABS: 10.0000 mg | ORAL_TABLET | Freq: Every day | ORAL | 3 refills | Status: DC

## 2017-07-20 NOTE — Progress Notes (Signed)
    S:     No chief complaint on file.   Patient arrives in good spirits, ambulating with assistance of a wheelchair, presenting with her Brother, Katherina RightDenny.  Presents for diabetes evaluation, education, and management at the request of Dr. Adriana Simasook. Patient was referred on 01/08/2017.  Patient was last seen by Primary Care Provider on 06/30/2017. Last Rx Clinic visit on 06/15/2017 - at that time metformin was increased however patient reports she is unable to tolerate BID dosing.   Today she reports that her CBGs have fluctuated some and and that she has been giving 40-44 units of insulin regularly. Reports back pain is worse some days than others but has an appointment with neurosurgery for 07/29/17. Still smoking 2 cigarettes/day. No change to diet or exercise, unfortunately.   Insurance coverage/medication affordability: obtains meds through [facility]  Patient reports adherence with medications.  Current diabetes medications include: Basaglar 38-44 units daily, metformin XR 500 mg once daily.   Patient denies hypoglycemic events.  Has greatly decreased soda intake, 2 cans of mellow yellow/day. Brother does the cooking.   Patient reported exercise habits: none now, limited by back pain  O:  Physical Exam  Constitutional: She appears well-developed and well-nourished.  Vitals reviewed.    Review of Systems  All other systems reviewed and are negative.    Lab Results  Component Value Date   HGBA1C 5.8 05/18/2017   There were no vitals filed for this visit.  GFR = 88 per 05/18/17 labs  Home fasting CBG: 130s-170s 2 hour post-prandial/random CBG: not checking  A/P: #Diabetes longstanding currently uncontrolled per CBGs however this is recent as her A1C is well below goal. Patient denies hypoglycemic events and is able to verbalize appropriate hypoglycemia management plan. Patient reports adherence with medication. Control is suboptimal due to stress, dietary indiscretion, and sedentary  lifestyle. Following discussion and approval by Dr. Birdie SonsSonnenberg, the following medication changes were made:  - Start Jardiance 10 mg once daily. Patient educated on purpose, proper use and potential adverse effects of Jardiance.  Following instruction patient verbalized understanding of treatment plan.   Medication Samples have been provided to the patient.  Drug name: Jardiance       Strength: 10 mg        Qty: 28 tabs  LOT: [id number]  Exp.Date: 07/23/2019  Dosing instructions: 1 tablet by mouth daily -Continue Basaglar 40-44 units daily -Continue metformin XR 500 mg daily  #ASCVD risk - greater than 7.5%. - Continued Aspirin 81 mg  - Continued rosuvastatin 10 mg.   Elevated blood pressure without diagnosis of hypertension. BP likely elevated 2/2 pain. Patient resistant to medications at this point however agrees to [place]. - Jardiance started as above, this should contribute to BP control - F/u at next visit  Patient was seen with Dr. Birdie SonsSonnenberg today in clinic and medication changes were discussed and approved prior to initiation.Written patient instructions provided.  Total time in face to face counseling 30 minutes.    Follow up in Pharmacist Clinic Visit 4 weeks.  Allena Katzaroline E Markian Glockner, Pharm.D. PGY2 Ambulatory Care Pharmacy Resident Phone: [phone]

## 2017-07-20 NOTE — Progress Notes (Signed)
I have reviewed the above note and agree. I saw the patient with the pharmacist.  Margerite Impastato, M.D.  

## 2017-07-20 NOTE — Patient Instructions (Signed)
Good to see you!   1. START Jardiance 10 mg once daily. I have sent a prescription to [facility] as well. Do NOT take this medication if you are sick/dehydrated/throwing up.   2. Continue taking ~40-44 units of insulin. Try to keep this steady and reduce big fluctuations in insulin dosing.   3. Follow up with [facility] to see if you need to sign any paperwork   Come back to see me in ~4 weeks.

## 2017-07-20 NOTE — Assessment & Plan Note (Signed)
#  Diabetes longstanding currently uncontrolled per CBGs however this is recent as her A1C is well below goal. Patient denies hypoglycemic events and is able to verbalize appropriate hypoglycemia management plan. Patient reports adherence with medication. Control is suboptimal due to stress, dietary indiscretion, and sedentary lifestyle. Following discussion and approval by Dr. Birdie SonsSonnenberg, the following medication changes were made:  - Start Jardiance 10 mg once daily. Patient educated on purpose, proper use and potential adverse effects of Jardiance.  Following instruction patient verbalized understanding of treatment plan.   Medication Samples have been provided to the patient.  Drug name: Jardiance       Strength: 10 mg        Qty: 28 tabs  LOT: [id number]  Exp.Date: 07/23/2019  Dosing instructions: 1 tablet by mouth daily -Continue Basaglar 40-44 units daily -Continue metformin XR 500 mg daily  #ASCVD risk - greater than 7.5%. - Continued Aspirin 81 mg  - Continued rosuvastatin 10 mg.

## 2017-07-31 ENCOUNTER — Telehealth: Payer: Self-pay

## 2017-07-31 ENCOUNTER — Encounter: Payer: Self-pay | Admitting: Pharmacist

## 2017-07-31 NOTE — Telephone Encounter (Signed)
Called patient. Please see encounter from today.

## 2017-07-31 NOTE — Telephone Encounter (Signed)
Please call patient   Copied from CRM [id number]. Topic: General - Other >> Jul 31, 2017  9:21 AM Cl[place], Princella PellegriniJessica D wrote: Reason for CRM: Patient states she would like [place] CitizenCaroline to give her a call, she would not tell what it was about.

## 2017-07-31 NOTE — Progress Notes (Signed)
Pt called stating that Jardiance caused a rash on her chest and numbness in her hands when typing on the computer for several minutes. Denies SOB or facial edema. Pt states she stopped taking Jardiance ~5 days ago and rash is nearly resolved. Numbness remains to be brought on with computer work. Explained to patient that this is likely not an ADE attributable to jardiance. Arthralgias are reported at ~2% incidence in both Jardiance and placebo arms in package insert. Patient states her CBGs have been good, fastings in the 110s. Denies any other ADE.   Encouraged pt to continue holding Jardiance. Allergy added.   Allena Katzaroline E Domanique Huesman, Pharm.D. PGY2 Ambulatory Care Pharmacy Resident Phone: [phone]

## 2017-08-17 ENCOUNTER — Encounter: Payer: Self-pay | Admitting: Pharmacist

## 2017-08-17 ENCOUNTER — Ambulatory Visit (INDEPENDENT_AMBULATORY_CARE_PROVIDER_SITE_OTHER): Payer: Self-pay | Admitting: Pharmacist

## 2017-08-17 VITALS — BP 161/85 | HR 87 | Wt 232.0 lb

## 2017-08-17 DIAGNOSIS — Z720 Tobacco use: Secondary | ICD-10-CM

## 2017-08-17 DIAGNOSIS — E119 Type 2 diabetes mellitus without complications: Secondary | ICD-10-CM

## 2017-08-17 LAB — POCT GLYCOSYLATED HEMOGLOBIN (HGB A1C): Hemoglobin A1C: 6.3

## 2017-08-17 NOTE — Assessment & Plan Note (Signed)
#  Tobacco cessation - patient currently smoking 0 cigarettes/day and plans to continue this way.  - Congratulated patient and encouraged her to maintain cessation. Health benefits discussed with patient.

## 2017-08-17 NOTE — Patient Instructions (Addendum)
Thanks for coming to see me! Your diabetes CONTINUES to be very well controlled. I'm so proud of you!   I recommend that you try to get in to see one of our providers SOONER than your January follow up with Dr. Birdie SonsSonnenberg to have your depression properly evaluated.   Please follow up with Dr. Birdie SonsSonnenberg for your diabetes. If your sugars start to get out of control, call into the office.

## 2017-08-17 NOTE — Assessment & Plan Note (Signed)
#  Diabetes longstanding currently well controlled per CBGs and A1C. Patient denies hypoglycemic events and is able to verbalize appropriate hypoglycemia management plan. Patient reports adherence with medication. Control is suboptimal due to stress, dietary indiscretion, and sedentary lifestyle. Following discussion and approval by Dr. Darrick Huntsmanullo, the following medication changes were made:  -D/c Jardiance - allergy added -Continue Basaglar 40-44 units daily -Continue metformin XR 500 mg daily

## 2017-08-17 NOTE — Assessment & Plan Note (Signed)
#  ASCVD risk - greater than 7.5%. - Continued Aspirin 81 mg  - Continued rosuvastatin 10 mg.   #Elevated blood pressure without diagnosis of hypertension. BP likely elevated 2/2 pain, obesity, stress, dietary indiscretion. Patient absolutely refuses discussion about risks vs benefits of antihypertensives and states she is unwilling to start one. - Extensive dietary counseling and tobacco cessation counseling discussed  - F/u at future visits

## 2017-08-17 NOTE — Progress Notes (Signed)
S:     Chief Complaint  Patient presents with  . Medication Management    Diabetes    Patient arrives in low spirits, presenting with her brother, Denne (pronounced "denny"), ambulating with a wheel chair.  Presents for diabetes evaluation, education, and management at the request of Dr. Lacinda Axon. Patient was referred on 01/08/2017.  Patient was last seen by Primary Care Provider on 06/30/2017. Last Rx Clinic visit on 06/15/2017 - at that time Vania Rea was started however patient developed arm numbness and rash on this medication and it was discontinued. Both have since resolved.   Patient reports today that the neurosurgeon visit was disappointing and that "there is nothing they can do". Has an appointment with a pain clinic in December but fears she will not be able to pay the copayment. Now smoking 0 cigarettes/day as they do not have the money to buy any at present.   Patient absolutely refuses blood pressure medication, citing a bad reaction that her ex-husband had to metoprolol. "I don't want ACE inhibitors, ARBs, beta blockers, nothing, he smelled dead from the inside out and it killed his libido". Patient refuses further conversation regarding hypertension treatment.  Patient reports depressive symptoms nearly every day. States "if I'm meant to be dead then so be it" but denies SI/HI. Reports she has never taken an SSRI/SNRI after going through a list of options. Has taken Cloud but felt her aggression worsened on this.   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  Current diabetes medications include: Basaglar 38-44 units daily, metformin XR 500 mg once daily. Current hypertension medications include: none  Patient denies hypoglycemic events.  Patient reported exercise habits: none at present, limited by back pain.  O:  Physical Exam    Constitutional: She appears well-developed and well-nourished.    Review of Systems  Skin: Negative for rash.  All other systems reviewed and are negative.  Depression screen PHQ 2/9 08/17/2017  Decreased Interest 3  Down, Depressed, Hopeless 3  PHQ - 2 Score 6  Altered sleeping 3  Tired, decreased energy 3  Change in appetite 3  Feeling bad or failure about yourself  3  Trouble concentrating 3  Moving slowly or fidgety/restless 0  Suicidal thoughts 1  PHQ-9 Score 22  Difficult doing work/chores Somewhat difficult    Diabetic Foot Exam - Simple   Simple Foot Form Visual Inspection No deformities, no ulcerations, no other skin breakdown bilaterally:  Yes Sensation Testing Intact to touch and monofilament testing bilaterally:  Yes Pulse Check Posterior Tibialis and Dorsalis pulse intact bilaterally:  Yes Comments    Lab Results  Component Value Date   HGBA1C 6.3 08/17/2017   Vitals:   08/17/17 1620  BP: (!) 161/85  Pulse: 87   Home fasting CBG: 110s-140s, excursions to 160s  A/P: #Diabetes longstanding currently well controlled per CBGs and A1C. Patient denies hypoglycemic events and is able to verbalize appropriate hypoglycemia management plan. Patient reports adherence with medication. Control is suboptimal due to stress, dietary indiscretion, and sedentary lifestyle. Following discussion and approval by Dr. Derrel Nip, the following medication changes were made:  -D/c Jardiance - allergy added -Continue Basaglar 40-44 units daily -Continue metformin XR 500 mg daily  #ASCVD risk - greater than 7.5%. - Continued Aspirin 81 mg  - Continued rosuvastatin 10 mg.   #Elevated blood pressure without diagnosis of hypertension. BP likely elevated 2/2 pain,  obesity, stress, dietary indiscretion. Patient absolutely refuses discussion about risks vs benefits of antihypertensives and states she is unwilling to start one. - Extensive dietary counseling and tobacco cessation  counseling discussed  - F/u at future visits  #Depression - patient with severe depression per PHQ 9 score of 22. No SI/HI.  - Discussed pharmacologic and nonpharmacologic options for treatment  - After discussion with Dr. Derrel Nip, asked patient to make an appointment with the first available provider to thoroughly assess the problem - Patient expressed understanding and expressed thanks  #Tobacco cessation - patient currently smoking 0 cigarettes/day and plans to continue this way.  - Congratulated patient and encouraged her to maintain cessation. Health benefits discussed with patient.   Patient was seen with Dr. Derrel Nip today in clinic and medication changes were discussed and approved prior to initiation.Written patient instructions provided.  Total time in face to face counseling 40 minutes.    Follow up with Dr. Caryl Bis for management of diabetes as patient has met Rx clinic goals.   Carlean Jews, Pharm.D., BCPS PGY2 Ambulatory Care Pharmacy Resident Phone: [phone]

## 2017-08-23 NOTE — Progress Notes (Signed)
  I have reviewed the above information and agree with above.   Deontez Klinke, MD 

## 2017-09-03 ENCOUNTER — Ambulatory Visit: Payer: Self-pay | Admitting: Internal Medicine

## 2017-09-25 ENCOUNTER — Telehealth: Payer: Self-pay | Admitting: Pharmacist

## 2017-09-25 NOTE — Telephone Encounter (Signed)
09/25/17 Mailing Denial letter to patient for Jardiance-we mailed paperwork to patient 07/22/17 to sign and return, not returned.Forde RadonAJ

## 2017-10-12 ENCOUNTER — Emergency Department
Admission: EM | Admit: 2017-10-12 | Discharge: 2017-10-13 | Disposition: A | Discharge status: Other Acute Inpatient Hospital | Payer: Self-pay | Attending: Emergency Medicine | Admitting: Emergency Medicine

## 2017-10-12 ENCOUNTER — Ambulatory Visit (INDEPENDENT_AMBULATORY_CARE_PROVIDER_SITE_OTHER): Payer: Self-pay | Admitting: Family Medicine

## 2017-10-12 ENCOUNTER — Other Ambulatory Visit: Payer: Self-pay

## 2017-10-12 ENCOUNTER — Encounter: Payer: Self-pay | Admitting: Family Medicine

## 2017-10-12 DIAGNOSIS — Z794 Long term (current) use of insulin: Secondary | ICD-10-CM

## 2017-10-12 DIAGNOSIS — M549 Dorsalgia, unspecified: Secondary | ICD-10-CM | POA: Insufficient documentation

## 2017-10-12 DIAGNOSIS — M545 Low back pain, unspecified: Secondary | ICD-10-CM | POA: Diagnosis present

## 2017-10-12 DIAGNOSIS — F32A Depression, unspecified: Secondary | ICD-10-CM | POA: Insufficient documentation

## 2017-10-12 DIAGNOSIS — Z046 Encounter for general psychiatric examination, requested by authority: Secondary | ICD-10-CM | POA: Insufficient documentation

## 2017-10-12 DIAGNOSIS — F329 Major depressive disorder, single episode, unspecified: Secondary | ICD-10-CM | POA: Insufficient documentation

## 2017-10-12 DIAGNOSIS — Z7982 Long term (current) use of aspirin: Secondary | ICD-10-CM | POA: Insufficient documentation

## 2017-10-12 DIAGNOSIS — M62838 Other muscle spasm: Secondary | ICD-10-CM

## 2017-10-12 DIAGNOSIS — F322 Major depressive disorder, single episode, severe without psychotic features: Secondary | ICD-10-CM

## 2017-10-12 DIAGNOSIS — E119 Type 2 diabetes mellitus without complications: Secondary | ICD-10-CM

## 2017-10-12 DIAGNOSIS — E1165 Type 2 diabetes mellitus with hyperglycemia: Secondary | ICD-10-CM

## 2017-10-12 DIAGNOSIS — R531 Weakness: Secondary | ICD-10-CM | POA: Insufficient documentation

## 2017-10-12 DIAGNOSIS — Z87891 Personal history of nicotine dependence: Secondary | ICD-10-CM | POA: Insufficient documentation

## 2017-10-12 DIAGNOSIS — R45851 Suicidal ideations: Secondary | ICD-10-CM | POA: Insufficient documentation

## 2017-10-12 DIAGNOSIS — G8929 Other chronic pain: Secondary | ICD-10-CM | POA: Diagnosis present

## 2017-10-12 LAB — COMPREHENSIVE METABOLIC PANEL
ALBUMIN: 3.9 g/dL (ref 3.5–5.0)
ALK PHOS: 119 U/L (ref 38–126)
ALT: 19 U/L (ref 14–54)
AST: 28 U/L (ref 15–41)
Anion gap: 14 (ref 5–15)
BILIRUBIN TOTAL: 0.9 mg/dL (ref 0.3–1.2)
BUN: 10 mg/dL (ref 6–20)
CALCIUM: 9.2 mg/dL (ref 8.9–10.3)
CO2: 17 mmol/L — AB (ref 22–32)
Chloride: 105 mmol/L (ref 101–111)
Creatinine, Ser: 0.63 mg/dL (ref 0.44–1.00)
GFR calc Af Amer: >60 mL/min (ref >60)
GFR calc non Af Amer: >60 mL/min (ref >60)
GLUCOSE: 126 mg/dL — AB (ref 65–99)
Potassium: 3.7 mmol/L (ref 3.5–5.1)
SODIUM: 136 mmol/L (ref 135–145)
TOTAL PROTEIN: 8 g/dL (ref 6.5–8.1)

## 2017-10-12 LAB — ACETAMINOPHEN LEVEL

## 2017-10-12 LAB — CBC
HEMATOCRIT: 46.4 % (ref 35.0–47.0)
HEMOGLOBIN: 15.5 g/dL (ref 12.0–16.0)
MCH: 28.4 pg (ref 26.0–34.0)
MCHC: 33.3 g/dL (ref 32.0–36.0)
MCV: 85.2 fL (ref 80.0–100.0)
Platelets: 397 10*3/uL (ref 150–440)
RBC: 5.44 MIL/uL — ABNORMAL HIGH (ref 3.80–5.20)
RDW: 14.2 % (ref 11.5–14.5)
WBC: 10.1 10*3/uL (ref 3.6–11.0)

## 2017-10-12 LAB — GLUCOSE, CAPILLARY: Glucose-Capillary: 165 mg/dL — ABNORMAL HIGH (ref 65–99)

## 2017-10-12 LAB — URINE DRUG SCREEN, QUALITATIVE (ARMC ONLY)
AMPHETAMINES, UR SCREEN: NOT DETECTED
BENZODIAZEPINE, UR SCRN: NOT DETECTED
Barbiturates, Ur Screen: NOT DETECTED
COCAINE METABOLITE, UR ~~LOC~~: NOT DETECTED
Cannabinoid 50 Ng, Ur ~~LOC~~: NOT DETECTED
MDMA (Ecstasy)Ur Screen: NOT DETECTED
METHADONE SCREEN, URINE: NOT DETECTED
OPIATE, UR SCREEN: NOT DETECTED
PHENCYCLIDINE (PCP) UR S: NOT DETECTED
Tricyclic, Ur Screen: NOT DETECTED

## 2017-10-12 LAB — SALICYLATE LEVEL: Salicylate Lvl: <7 mg/dL (ref 2.8–30.0)

## 2017-10-12 LAB — ETHANOL: Alcohol, Ethyl (B): <10 mg/dL (ref <10)

## 2017-10-12 MED ORDER — INSULIN GLARGINE 100 UNIT/ML ~~LOC~~ SOLN
40.0000 [IU] | Freq: Every day | SUBCUTANEOUS | Status: DC
  Administered 2017-10-13: 40 [IU] via SUBCUTANEOUS
  Filled 2017-10-12: qty 0.4

## 2017-10-12 MED ORDER — INSULIN ASPART 100 UNIT/ML ~~LOC~~ SOLN
0.0000 [IU] | Freq: Three times a day (TID) | SUBCUTANEOUS | Status: DC
  Filled 2017-10-12: qty 1

## 2017-10-12 MED ORDER — TRAMADOL HCL 50 MG PO TABS
50.0000 mg | ORAL_TABLET | Freq: Once | ORAL | Status: AC
  Administered 2017-10-12: 50 mg via ORAL

## 2017-10-12 MED ORDER — DULOXETINE HCL 30 MG PO CPEP
30.0000 mg | ORAL_CAPSULE | Freq: Every day | ORAL | Status: DC
  Administered 2017-10-12 – 2017-10-13 (×2): 30 mg via ORAL
  Filled 2017-10-12 (×2): qty 1

## 2017-10-12 MED ORDER — METFORMIN HCL 500 MG PO TABS
500.0000 mg | ORAL_TABLET | Freq: Two times a day (BID) | ORAL | Status: DC
  Administered 2017-10-13: 500 mg via ORAL
  Filled 2017-10-12: qty 1

## 2017-10-12 MED ORDER — TRAMADOL HCL 50 MG PO TABS
ORAL_TABLET | ORAL | Status: AC
  Filled 2017-10-12: qty 1

## 2017-10-12 NOTE — ED Triage Notes (Signed)
Pt to triage via wheelchair. Pt brought in via ems from [facility].  Pt reports she is SI.  Denies HI.  Pt denies drug or etoh use today.  Pt tearful in triage. Pt has chronic back pain and states she has no insurance to pay for surgery.

## 2017-10-12 NOTE — Assessment & Plan Note (Signed)
Seems to be relatively well controlled recently.  She will continue her current regimen.

## 2017-10-12 NOTE — Progress Notes (Signed)
  Laura AlarEric Zoejane Gaulin, MD Phone: [phone]  Laura Small is a 57 y.o. female who presents today for follow-up.  Diabetes: Sugars are running 120s-130s.  She is taking 28-48 units of glargine based on her CBGs.  Gauging proportionately from her prior CBGs.  Taking 1-2 metformin tablets per day.  No polyuria or polydipsia.  No hypoglycemia.  No vision changes.  She has not seen ophthalmology due to cost concerns.  Depression: Over the last year she has felt depressed.  She tries to keep busy.  She notes her husband left her.  Her mother-in-law passed away.  She has had daily thoughts of killing herself since about a year ago.  She notes she has every intent to do this if she were ever to obtain the means to do it.  She notes no current plan.  She notes the issues with her husband and her chronic issues with her low back have led her to feel this way.  She notes her husband does not trust her as he was told by someone in our office that the patient had had diabetes for 2+ years though she had only just learned of it herself this led to distrust on his part.  She has had spasms in her legs intermittently.  She is been supplementing potassium on her own.  Feels this is related to her insulin.  Social History   Tobacco Use  Smoking Status Former Smoker  . Packs/day: 0.00  . Types: Cigarettes  Smokeless Tobacco Never Used     ROS see history of present illness  Objective  Physical Exam Vitals:   10/12/17 1357  BP: 136/88  Pulse: 81  Temp: 97.9 F (36.6 C)  SpO2: 97%    BP Readings from Last 3 Encounters:  10/12/17 (!) 161/90  10/12/17 136/88  08/17/17 (!) 161/85   Wt Readings from Last 3 Encounters:  10/12/17 230 lb (104.3 kg)  10/12/17 230 lb 3.2 oz (104.4 kg)  08/17/17 232 lb (105.2 kg)    Physical Exam  Constitutional: No distress.  Cardiovascular: Normal rate, regular rhythm and normal heart sounds.  Pulmonary/Chest: Effort normal and breath sounds normal.    Musculoskeletal: She exhibits no edema.  Neurological: She is alert. Gait normal.  Skin: Skin is warm and dry. She is not diaphoretic.  Psychiatric:  Depressed mood, affect flat, intermittently tearful, raises voice at times     Assessment/Plan: Please see individual problem list.  Depression, major, single episode, severe (HCC) Patient with severe depression.  Has suicidal ideation with thoughts of killing herself.  She does intend to do this though has no plan.  Discussed that this warrants emergency room evaluation.  After a long discussion with her she is willing to be evaluated for this.  She will be transported by EMS to the emergency department for further evaluation.  DM (diabetes mellitus), type 2 (HCC) Seems to be relatively well controlled recently.  She will continue her current regimen.  Muscle spasms of both lower extremities Potentially related to medication versus inadequate liquid intake.  She will be evaluated in the emergency department for her depression.  If they do not check electrolytes we can check at follow-up.   No orders of the defined types were placed in this encounter.   No orders of the defined types were placed in this encounter.    Laura AlarEric Gianah Batt, MD [facility] [place] [facility]

## 2017-10-12 NOTE — ED Provider Notes (Signed)
[facility] Emergency Department Provider Note ____________________________________________   First MD Initiated Contact with Patient 10/12/17 1629     (approximate)  I have reviewed the triage vital signs and the nursing notes.   HISTORY  Chief Complaint Behavior Problem    HPI Laura Small is a 57 y.o. female with past medical history as noted below including most notably chronic back pain who presents with increased sadness and despondency over her chronic medical issues, her increased disability, and her inability to take care of her problems.  Patient states that her husband also is leaving her, which is increased her sadness.  She denies any previous history of mental health problems, and is not on any medication.  She does report vague suicidal ideation; she states that she feels that her family would be better off without her.  However when asked further, she states that she has no means to kill herself, such as a gun, but she states that if she did she would consider it.  Patient states that she has no new symptoms with the back pain or chronic weakness in her legs.  She denies any numbness.  She states the pain is the same and is has been chronically.  She states that because she has no insurance, she has not been able to follow up with specialists to achieve adequate pain management.  She also states that she has had difficulty trying to get information about disability paperwork or other social work assistance.   Past Medical History:  Diagnosis Date  . Back pain   . Chicken pox   . Diabetes mellitus without complication (HCC)   . Migraines   . PONV (postoperative nausea and vomiting)    usually has 1 episode of vomiting within 1 hour of anesthesia     Patient Active Problem List   Diagnosis Date Noted  . Depression, major, single episode, severe (HCC) 10/12/2017  . Aortic atherosclerosis (HCC) 01/08/2017  . Tobacco abuse 01/08/2017  .  Ileitis 01/08/2017  . DM (diabetes mellitus), type 2 (HCC) 09/23/2016  . Hyperlipidemia 09/23/2016  . Chronic low back pain 09/08/2016    Past Surgical History:  Procedure Laterality Date  . BACK SURGERY    . CHOLECYSTECTOMY    . INSERTION OF MESH N/A 02/12/2015   Procedure: INSERTION OF MESH;  Surgeon: Natale Lay, MD;  Location: [facility] ORS;  Service: General;  Laterality: N/A;  . VENTRAL HERNIA REPAIR N/A 02/12/2015   Procedure: HERNIA REPAIR VENTRAL ADULT;  Surgeon: Natale Lay, MD;  Location: [facility] ORS;  Service: General;  Laterality: N/A;    Prior to Admission medications   Medication Sig Start Date End Date Taking? Authorizing Provider  aspirin EC 81 MG tablet Take 1 tablet (81 mg total) by mouth daily. 01/08/17   Tommie Sams, DO  Insulin Glargine (BASAGLAR KWIKPEN) 100 UNIT/ML SOPN Inject 0.4 mLs (40 Units total) into the skin every morning. Increase per instructions. 06/15/17   Glori Luis, MD  Insulin Pen Needle 32G X 6 MM MISC Use as directed for once daily insulin. E11.9 02/09/17   Tommie Sams, DO  metFORMIN (GLUCOPHAGE XR) 500 MG 24 hr tablet Take 1 tablet (500 mg total) by mouth 2 (two) times daily. 06/15/17   Glori Luis, MD    Allergies Bee venom; Advil [ibuprofen]; Jardiance [empagliflozin]; Nitroglycerin; and Prednisone  Family History  Problem Relation Age of Onset  . Diabetes Mother   . Gallbladder disease Mother   .  Heart disease Father   . Alcohol abuse Father   . Gallbladder disease Father   . Heart disease Paternal Grandmother   . Colon cancer Neg Hx     Social History Social History   Tobacco Use  . Smoking status: Former Smoker    Packs/day: 0.00    Types: Cigarettes  . Smokeless tobacco: Never Used  Substance Use Topics  . Alcohol use: No  . Drug use: No    Review of Systems  Constitutional: No fever. Eyes: No redness. ENT: No neck pain. Cardiovascular: Denies chest pain. Respiratory: Denies shortness of  breath. Gastrointestinal: No abdominal pain.  Genitourinary: Negative for incontinence.  Musculoskeletal: Positive for back pain. Skin: Negative for rash. Neurological: Negative for headache   ____________________________________________   PHYSICAL EXAM:  VITAL SIGNS: ED Triage Vitals  Enc Vitals Group     BP 10/12/17 1517 (!) 161/90     Pulse Rate 10/12/17 1517 77     Resp 10/12/17 1517 20     Temp 10/12/17 1517 98.5 F (36.9 C)     Temp Source 10/12/17 1517 Oral     SpO2 10/12/17 1517 99 %     Weight 10/12/17 1519 230 lb (104.3 kg)     Height 10/12/17 1519 5\' 7"  (1.702 m)     Head Circumference --      Peak Flow --      Pain Score 10/12/17 1518 10     Pain Loc --      Pain Edu? --      Excl. in GC? --     Constitutional: Alert and oriented.  Relatively comfortable appearing and in no acute distress. Eyes: Conjunctivae are normal.  Head: Atraumatic. Nose: No congestion/rhinnorhea. Mouth/Throat: Mucous membranes are moist.   Neck: Normal range of motion.  Cardiovascular:  Good peripheral circulation. Respiratory: Normal respiratory effort.  Gastrointestinal: Soft and nontender. No distention.  Genitourinary: No flank tenderness. Musculoskeletal: No lower extremity edema.  Extremities warm and well perfused.  Neurologic:  Normal speech and language.  Bilateral lower extremities with slight weakness which is chronic per patient.  No numbness.  Full range of motion all joints. Skin:  Skin is warm and dry. No rash noted. Psychiatric: Mood and affect are normal. Speech and behavior are normal.  ____________________________________________   LABS (all labs ordered are listed, but only abnormal results are displayed)  Labs Reviewed  COMPREHENSIVE METABOLIC PANEL - Abnormal; Notable for the following components:      Result Value   CO2 17 (*)    Glucose, Bld 126 (*)    All other components within normal limits  ACETAMINOPHEN LEVEL - Abnormal; Notable for the  following components:   Acetaminophen (Tylenol), Serum <10 (*)    All other components within normal limits  CBC - Abnormal; Notable for the following components:   RBC 5.44 (*)    All other components within normal limits  ETHANOL  SALICYLATE LEVEL  URINE DRUG SCREEN, QUALITATIVE ([facility] ONLY)   ____________________________________________  EKG   ____________________________________________  RADIOLOGY    ____________________________________________   PROCEDURES  Procedure(s) performed: No    Critical Care performed: No ____________________________________________   INITIAL IMPRESSION / ASSESSMENT AND PLAN / ED COURSE  Pertinent labs & imaging results that were available during my care of the patient were reviewed by me and considered in my medical decision making (see chart for details).  57 year old female with past medical history as noted above and with a history of chronic back pain  presents with increased sadness and despondency over her chronic medical issues, with  vague suicidal ideation.  She denies any acute change in her chronic back pain, any new neurologic symptoms, or any other acute medical issues.  On exam, the patient is hypertensive but the other vital signs are normal, and the exam is as described above with no acute neurologic deficits.  Presentation is consistent with chronic back pain.  There is no evidence of cauda equina or other acute etiology.  Patient reports difficulty with trying to get disability or establishing Medicaid or other insurance.  These have been ongoing issues for at least several months.  I reviewed the past medical records in Epic; patient has had multiple prior ED visits over the last year for chronic back pain.  No prior mental health history.  At this time, the patient is calm and cooperative, and has no active suicidal plan.  She is able to contract for safety, and is here voluntarily.  I will consult psychiatry for  evaluation, and will dispose the patient based on the recommendations.  There is no indication for any acute workup or treatment for the chronic back pain.  It is after hours, so social work is not available, however there is no new social work issue or medical reason that the patient cannot be safely discharged.  If patient is discharged per psych, I will give information for any available outpatient resources.    ----------------------------------------- 6:27 PM on 10/12/2017 -----------------------------------------  Dr. Toni Amend has evaluated the patient.  He recommends inpatient admission.  Patient is medically cleared.  ____________________________________________   FINAL CLINICAL IMPRESSION(S) / ED DIAGNOSES  Final diagnoses:  Suicidal ideation      NEW MEDICATIONS STARTED DURING THIS VISIT:  New Prescriptions   No medications on file     Note:  This document was prepared using Dragon voice recognition software and may include unintentional dictation errors.    Dionne Bucy, MD 10/12/17 [phone]

## 2017-10-12 NOTE — Consult Note (Signed)
[place] [facility] Consult   Reason for Consult: Consult for 57 year old woman who was referred to the emergency room by her primary care doctor because  of depression Referring Physician: Siadecki Patient Identification: Laura Small MRN:  [id number] Principal Diagnosis: Depression, major, single episode, severe ([place]) Diagnosis:   Patient Active Problem List   Diagnosis Date Noted  . Depression, major, single episode, severe ([place]) [F32.2] 10/12/2017  . Muscle spasms of both lower extremities [M62.838] 10/12/2017  . Aortic atherosclerosis ([place]) [I70.0] 01/08/2017  . Tobacco abuse [Z72.0] 01/08/2017  . Ileitis [K52.9] 01/08/2017  . DM (diabetes mellitus), type 2 ([place]) [E11.9] 09/23/2016  . Hyperlipidemia [E78.5] 09/23/2016  . Chronic low back pain [M54.5, G89.29] 09/08/2016    Total Time spent with patient: 1 hour  Subjective:   Laura Small is a 57 y.o. female patient admitted with "I just do not have anything to live for".  HPI: Patient interviewed chart reviewed.  57 year old woman referred here from her primary care doctor's office.  She presented there today with complaints of multiple severe symptoms of depression.  Patient tells me that she has been under an overwhelming amount of stress and that things are just getting worse.  She dates the beginning of her problems to 2017 when she first developed back pain.  Subsequent to that she developed diabetes.  Subsequent to that her husband has left her.  Patient now is not able to work and struggles with chronic back pain.  Mood feels sad down and hopeless all the time for months.  Sleep is very poor with frequent interruptions.  Appetite is decreased.  Patient now has frequent suicidal thoughts.  She tells me that she absolutely feels that she would act on it if she could.  She denies using any alcohol or drugs.  She denies any psychotic symptoms.  Patient is tearful and appears very emotional throughout the interview.  Not  currently receiving any mental health treatment.  Social history: Patient is currently living with her younger brother.  She has not been able to work since injuring her back.  She has not applied for disability and claims that no one has told her how to do so and she feels that she gets no help at all.  She says that her husband left her because she develop diabetes.  Medical history: Chronic low back pain.  Not on any pain medicine.  Diabetes hyperlipidemia difficulty walking  Substance abuse history: Patient says she used to use drugs when she was a teenager but has not used any drugs in decades and does not drink alcohol.  Past Psychiatric History: Patient has never seen a psychiatrist or mental health provider.  Never been prescribed any medicine for depression.  Denies any history of suicide attempts or violence denies any history of psychosis.  She says this is the first time she is ever talk to anyone about her mental health problems  Risk to Self: Is patient at risk for suicide?: Yes Risk to Others:   Prior Inpatient Therapy:   Prior Outpatient Therapy:    Past Medical History:  Past Medical History:  Diagnosis Date  . Back pain   . Chicken pox   . Diabetes mellitus without complication ([place])   . Migraines   . PONV (postoperative nausea and vomiting)    usually has 1 episode of vomiting within 1 hour of anesthesia     Past Surgical History:  Procedure Laterality Date  . BACK SURGERY    . CHOLECYSTECTOMY    .  INSERTION OF MESH N/A 02/12/2015   Procedure: INSERTION OF MESH;  Surgeon: Sherri Rad, MD;  Location: [facility] ORS;  Service: General;  Laterality: N/A;  . VENTRAL HERNIA REPAIR N/A 02/12/2015   Procedure: HERNIA REPAIR VENTRAL ADULT;  Surgeon: Sherri Rad, MD;  Location: [facility] ORS;  Service: General;  Laterality: N/A;   Family History:  Family History  Problem Relation Age of Onset  . Diabetes Mother   . Gallbladder disease Mother   . Heart disease Father   . Alcohol abuse  Father   . Gallbladder disease Father   . Heart disease Paternal Grandmother   . Colon cancer Neg Hx    Family Psychiatric  History: Denies knowing of any family history Social History:  Social History   Substance and Sexual Activity  Alcohol Use No     Social History   Substance and Sexual Activity  Drug Use No    Social History   Socioeconomic History  . Marital status: Married    Spouse name: Not on file  . Number of children: Not on file  . Years of education: Not on file  . Highest education level: Not on file  Social Needs  . Financial resource strain: Not on file  . Food insecurity - worry: Not on file  . Food insecurity - inability: Not on file  . Transportation needs - medical: Not on file  . Transportation needs - non-medical: Not on file  Occupational History  . Not on file  Tobacco Use  . Smoking status: Former Smoker    Packs/day: 0.00    Types: Cigarettes  . Smokeless tobacco: Never Used  Substance and Sexual Activity  . Alcohol use: No  . Drug use: No  . Sexual activity: Not Currently    Partners: Male  Other Topics Concern  . Not on file  Social History Narrative  . Not on file   Additional Social History:    Allergies:   Allergies  Allergen Reactions  . Bee Venom Anaphylaxis and Swelling  . Advil [Ibuprofen] Swelling  . Jardiance [Empagliflozin] Rash  . Nitroglycerin Other (See Comments)    Pt states "causes me to feel off and weird all over and pressure in hands"  . Prednisone Swelling    Labs:  Results for orders placed or performed during the hospital encounter of 10/12/17 (from the past 48 hour(s))  Comprehensive metabolic panel     Status: Abnormal   Collection Time: 10/12/17  3:16 PM  Result Value Ref Range   Sodium 136 135 - 145 mmol/L   Potassium 3.7 3.5 - 5.1 mmol/L   Chloride 105 101 - 111 mmol/L   CO2 17 (L) 22 - 32 mmol/L   Glucose, Bld 126 (H) 65 - 99 mg/dL   BUN 10 6 - 20 mg/dL   Creatinine, Ser 0.63 0.44 - 1.00  mg/dL   Calcium 9.2 8.9 - 10.3 mg/dL   Total Protein 8.0 6.5 - 8.1 g/dL   Albumin 3.9 3.5 - 5.0 g/dL   AST 28 15 - 41 U/L   ALT 19 14 - 54 U/L   Alkaline Phosphatase 119 38 - 126 U/L   Total Bilirubin 0.9 0.3 - 1.2 mg/dL   GFR calc non Af Amer >60 >60 mL/min   GFR calc Af Amer >60 >60 mL/min    Comment: (NOTE) The eGFR has been calculated using the CKD EPI equation. This calculation has not been validated in all clinical situations. eGFR's persistently <60 mL/min signify possible  Chronic Kidney Disease.    Anion gap 14 5 - 15    Comment: Performed at [facility], [address] [place], [place] [address]  Ethanol     Status: None   Collection Time: 10/12/17  3:16 PM  Result Value Ref Range   Alcohol, Ethyl (B) <10 <10 mg/dL    Comment:        LOWEST DETECTABLE LIMIT FOR SERUM ALCOHOL IS 10 mg/dL FOR MEDICAL PURPOSES ONLY Performed at [facility], [address] [place], [place] [address]   Salicylate level     Status: None   Collection Time: 10/12/17  3:16 PM  Result Value Ref Range   Salicylate Lvl <6.9 2.8 - 30.0 mg/dL    Comment: Performed at [facility], [address] [place], [place] [address]  Acetaminophen level     Status: Abnormal   Collection Time: 10/12/17  3:16 PM  Result Value Ref Range   Acetaminophen (Tylenol), Serum <10 (L) 10 - 30 ug/mL    Comment:        THERAPEUTIC CONCENTRATIONS VARY SIGNIFICANTLY. A RANGE OF 10-30 ug/mL MAY BE AN EFFECTIVE CONCENTRATION FOR MANY PATIENTS. HOWEVER, SOME ARE BEST TREATED AT CONCENTRATIONS OUTSIDE THIS RANGE. ACETAMINOPHEN CONCENTRATIONS >150 ug/mL AT 4 HOURS AFTER INGESTION AND >50 ug/mL AT 12 HOURS AFTER INGESTION ARE OFTEN ASSOCIATED WITH TOXIC REACTIONS. Performed at [facility], [address] [place], [place] [address]   cbc     Status: Abnormal   Collection Time: 10/12/17  3:16 PM  Result Value Ref Range   WBC 10.1 3.6 - 11.0 K/uL   RBC 5.44 (H) 3.80 -  5.20 MIL/uL   Hemoglobin 15.5 12.0 - 16.0 g/dL   HCT 46.4 35.0 - 47.0 %   MCV 85.2 80.0 - 100.0 fL   MCH 28.4 26.0 - 34.0 pg   MCHC 33.3 32.0 - 36.0 g/dL   RDW 14.2 11.5 - 14.5 %   Platelets 397 150 - 440 K/uL    Comment: Performed at [facility], [address] [place],  [address]  Urine Drug Screen, Qualitative     Status: None   Collection Time: 10/12/17  3:21 PM  Result Value Ref Range   Tricyclic, Ur Screen NONE DETECTED NONE DETECTED   Amphetamines, Ur Screen NONE DETECTED NONE DETECTED   MDMA (Ecstasy)Ur Screen NONE DETECTED NONE DETECTED   Cocaine Metabolite,Ur [place] NONE DETECTED NONE DETECTED   Opiate, Ur Screen NONE DETECTED NONE DETECTED   Phencyclidine (PCP) Ur S NONE DETECTED NONE DETECTED   Cannabinoid 50 Ng, Ur [place] NONE DETECTED NONE DETECTED   Barbiturates, Ur Screen NONE DETECTED NONE DETECTED   Benzodiazepine, Ur Scrn NONE DETECTED NONE DETECTED   Methadone Scn, Ur NONE DETECTED NONE DETECTED    Comment: (NOTE) Tricyclics + metabolites, urine    Cutoff 1000 ng/mL Amphetamines + metabolites, urine  Cutoff 1000 ng/mL MDMA (Ecstasy), urine              Cutoff 500 ng/mL Cocaine Metabolite, urine          Cutoff 300 ng/mL Opiate + metabolites, urine        Cutoff 300 ng/mL Phencyclidine (PCP), urine         Cutoff 25 ng/mL Cannabinoid, urine                 Cutoff 50 ng/mL Barbiturates + metabolites, urine  Cutoff 200 ng/mL Benzodiazepine, urine  Cutoff 200 ng/mL Methadone, urine                   Cutoff 300 ng/mL The urine drug screen provides only a preliminary, unconfirmed analytical test result and should not be used for non-medical purposes. Clinical consideration and professional judgment should be applied to any positive drug screen result due to possible interfering substances. A more specific alternate chemical method must be used in order to obtain a confirmed analytical result. Gas chromatography / mass spectrometry (GC/MS) is  the preferred confirmat ory method. Performed at [facility], [address] [place], [place] [address]     No current facility-administered medications for this encounter.    Current Outpatient Medications  Medication Sig Dispense Refill  . aspirin EC 81 MG tablet Take 1 tablet (81 mg total) by mouth daily.    . Insulin Glargine (BASAGLAR KWIKPEN) 100 UNIT/ML SOPN Inject 0.4 mLs (40 Units total) into the skin every morning. Increase per instructions. 5 pen 6  . Insulin Pen Needle 32G X 6 MM MISC Use as directed for once daily insulin. E11.9 100 each 3  . metFORMIN (GLUCOPHAGE XR) 500 MG 24 hr tablet Take 1 tablet (500 mg total) by mouth 2 (two) times daily. 60 tablet 3    Musculoskeletal: Strength & Muscle Tone: decreased Gait & Station: unsteady Patient leans: N/A  Psychiatric Specialty Exam: Physical Exam  Nursing note and vitals reviewed. Constitutional: She appears well-developed and well-nourished.  HENT:  Head: Normocephalic and atraumatic.  Eyes: Conjunctivae are normal. Pupils are equal, round, and reactive to light.  Neck: Normal range of motion.  Cardiovascular: Regular rhythm and normal heart sounds.  Respiratory: Effort normal. No respiratory distress.  GI: Soft.  Musculoskeletal: Normal range of motion. She exhibits tenderness.  Neurological: She is alert.  Patient's gait is unsteady related to her chronic back pain  Skin: Skin is warm and dry.  Psychiatric: Her speech is delayed and tangential. She is slowed and withdrawn. Cognition and memory are impaired. She expresses impulsivity. She exhibits a depressed mood. She expresses suicidal ideation. She expresses suicidal plans.    Review of Systems  Constitutional: Negative.   HENT: Negative.   Eyes: Negative.   Respiratory: Negative.   Cardiovascular: Negative.   Gastrointestinal: Negative.   Musculoskeletal: Positive for back pain.  Skin: Negative.   Neurological: Positive for focal  weakness.  Psychiatric/Behavioral: Positive for depression and suicidal ideas. Negative for hallucinations, memory loss and substance abuse. The patient is nervous/anxious and has insomnia.     Blood pressure (!) 161/90, pulse 77, temperature 98.5 F (36.9 C), temperature source Oral, resp. rate 20, height _0  (1.702 m), weight 104.3 kg (230 lb), SpO2 99 %.Body mass index is 36.02 kg/m.  General Appearance: Casual  Eye Contact:  Good  Speech:  Clear and Coherent and Pressured  Volume:  Increased  Mood:  Angry, Anxious, Depressed and Irritable  Affect:  Congruent  Thought Process:  Disorganized  Orientation:  Full (Time, Place, and Person)  Thought Content:  Logical, Rumination and Tangential  Suicidal Thoughts:  Yes.  with intent/plan  Homicidal Thoughts:  No  Memory:  Immediate;   Fair Recent;   Fair Remote;   Fair  Judgement:  Impaired  Insight:  Shallow  Psychomotor Activity:  Restlessness  Concentration:  Concentration: Poor  Recall:  [place] of Knowledge:  Poor  Language:  Fair  Akathisia:  No  Handed:  Right  AIMS (if indicated):  Assets:  Desire for Improvement Housing  ADL's:  Impaired  Cognition:  Impaired,  Mild  Sleep:        Treatment Plan Summary: Daily contact with patient to assess and evaluate symptoms and progress in treatment, Medication management and Plan This is a 57 year old woman with chronic pain diabetes and major depression with active suicidal thoughts.  Very agitated and tearful.  Not safe to be discharged home.  Recommend inpatient treatment.  Case reviewed with ER physician.  I initiated involuntary commitment papers.  Spoke with TTS.  Patient should be admitted or referred to other hospitals.  Orders will be placed for safety and for her outpatient medication at this time.  Disposition: Recommend psychiatric Inpatient admission when medically cleared.  Alethia Berthold, MD 10/12/2017 7:18 PM

## 2017-10-12 NOTE — ED Notes (Signed)
Pt. Escorted to [facility] #6 from quad.  Pt. Advised of cameras and 15 min safety checks.

## 2017-10-12 NOTE — ED Notes (Signed)
Pt. Given an additional mattress due to back problem.

## 2017-10-12 NOTE — ED Notes (Signed)
PT  PUT  UNDER  IVC  INFORMED  IRIS  RN   AND  ODS  OFFICER  [facility]

## 2017-10-12 NOTE — Assessment & Plan Note (Signed)
Potentially related to medication versus inadequate liquid intake.  She will be evaluated in the emergency department for her depression.  If they do not check electrolytes we can check at follow-up.

## 2017-10-12 NOTE — Assessment & Plan Note (Addendum)
Patient with severe depression.  Has suicidal ideation with thoughts of killing herself.  She does intend to do this though has no plan.  Discussed that this warrants emergency room evaluation.  After a long discussion with her she is willing to be evaluated for this.  She will be transported by EMS to the emergency department for further evaluation.

## 2017-10-12 NOTE — ED Notes (Signed)
Pt denies having any active plan to hurt herself.  Pt states she just feels like a burden to her family and that they'd be better off if she didn't exist.  Pt also states she is just tired of being in pain.

## 2017-10-12 NOTE — ED Notes (Signed)
BS 165

## 2017-10-13 ENCOUNTER — Inpatient Hospital Stay
Admission: AD | Admit: 2017-10-13 | Discharge: 2017-10-16 | DRG: 885 | Disposition: A | Discharge status: Home / Self Care | Payer: No Typology Code available for payment source | Source: Intra-hospital | Attending: Psychiatry | Admitting: Psychiatry

## 2017-10-13 ENCOUNTER — Encounter: Payer: Self-pay | Admitting: Psychiatry

## 2017-10-13 ENCOUNTER — Other Ambulatory Visit: Payer: Self-pay

## 2017-10-13 DIAGNOSIS — E785 Hyperlipidemia, unspecified: Secondary | ICD-10-CM | POA: Diagnosis present

## 2017-10-13 DIAGNOSIS — Z8249 Family history of ischemic heart disease and other diseases of the circulatory system: Secondary | ICD-10-CM | POA: Diagnosis not present

## 2017-10-13 DIAGNOSIS — M62838 Other muscle spasm: Secondary | ICD-10-CM | POA: Diagnosis present

## 2017-10-13 DIAGNOSIS — Z886 Allergy status to analgesic agent status: Secondary | ICD-10-CM

## 2017-10-13 DIAGNOSIS — Z7982 Long term (current) use of aspirin: Secondary | ICD-10-CM

## 2017-10-13 DIAGNOSIS — F329 Major depressive disorder, single episode, unspecified: Secondary | ICD-10-CM | POA: Diagnosis present

## 2017-10-13 DIAGNOSIS — Z91030 Bee allergy status: Secondary | ICD-10-CM | POA: Diagnosis not present

## 2017-10-13 DIAGNOSIS — F322 Major depressive disorder, single episode, severe without psychotic features: Secondary | ICD-10-CM | POA: Diagnosis present

## 2017-10-13 DIAGNOSIS — G8929 Other chronic pain: Secondary | ICD-10-CM | POA: Diagnosis present

## 2017-10-13 DIAGNOSIS — E1165 Type 2 diabetes mellitus with hyperglycemia: Secondary | ICD-10-CM

## 2017-10-13 DIAGNOSIS — Z794 Long term (current) use of insulin: Secondary | ICD-10-CM

## 2017-10-13 DIAGNOSIS — Z888 Allergy status to other drugs, medicaments and biological substances status: Secondary | ICD-10-CM

## 2017-10-13 DIAGNOSIS — E119 Type 2 diabetes mellitus without complications: Secondary | ICD-10-CM | POA: Diagnosis present

## 2017-10-13 DIAGNOSIS — Z811 Family history of alcohol abuse and dependence: Secondary | ICD-10-CM | POA: Diagnosis not present

## 2017-10-13 DIAGNOSIS — R45851 Suicidal ideations: Secondary | ICD-10-CM | POA: Diagnosis present

## 2017-10-13 DIAGNOSIS — M545 Low back pain: Secondary | ICD-10-CM | POA: Diagnosis present

## 2017-10-13 DIAGNOSIS — Z833 Family history of diabetes mellitus: Secondary | ICD-10-CM | POA: Diagnosis not present

## 2017-10-13 DIAGNOSIS — Z87891 Personal history of nicotine dependence: Secondary | ICD-10-CM | POA: Diagnosis not present

## 2017-10-13 LAB — GLUCOSE, CAPILLARY
GLUCOSE-CAPILLARY: 137 mg/dL — AB (ref 65–99)
Glucose-Capillary: 126 mg/dL — ABNORMAL HIGH (ref 65–99)

## 2017-10-13 MED ORDER — ALUM & MAG HYDROXIDE-SIMETH 200-200-20 MG/5ML PO SUSP: 30.0000 mL | ORAL | Status: DC | PRN

## 2017-10-13 MED ORDER — MAGNESIUM HYDROXIDE 400 MG/5ML PO SUSP
30.0000 mL | Freq: Every day | ORAL | Status: DC | PRN
Start: 2017-10-13 — End: 2017-10-16

## 2017-10-13 MED ORDER — INSULIN ASPART 100 UNIT/ML ~~LOC~~ SOLN: 0.0000 [IU] | Freq: Three times a day (TID) | SUBCUTANEOUS | Status: DC

## 2017-10-13 MED ORDER — ACETAMINOPHEN 325 MG PO TABS
650.0000 mg | ORAL_TABLET | Freq: Four times a day (QID) | ORAL | Status: DC | PRN
  Administered 2017-10-13: 650 mg via ORAL
  Filled 2017-10-13: qty 2

## 2017-10-13 MED ORDER — METFORMIN HCL 500 MG PO TABS
500.0000 mg | ORAL_TABLET | Freq: Two times a day (BID) | ORAL | Status: DC
  Administered 2017-10-14 – 2017-10-16 (×4): 500 mg via ORAL
  Filled 2017-10-13 (×5): qty 1

## 2017-10-13 MED ORDER — INSULIN GLARGINE 100 UNIT/ML ~~LOC~~ SOLN
40.0000 [IU] | Freq: Every day | SUBCUTANEOUS | Status: DC
  Administered 2017-10-14: 28 [IU] via SUBCUTANEOUS
  Filled 2017-10-13 (×3): qty 0.4

## 2017-10-13 MED ORDER — TRAZODONE HCL 100 MG PO TABS
100.0000 mg | ORAL_TABLET | Freq: Every evening | ORAL | Status: DC | PRN
  Administered 2017-10-13 – 2017-10-15 (×2): 100 mg via ORAL
  Filled 2017-10-13 (×2): qty 1

## 2017-10-13 MED ORDER — ASPIRIN EC 81 MG PO TBEC
81.0000 mg | DELAYED_RELEASE_TABLET | Freq: Every day | ORAL | Status: DC
  Administered 2017-10-14 – 2017-10-16 (×3): 81 mg via ORAL
  Filled 2017-10-13 (×3): qty 1

## 2017-10-13 MED ORDER — DULOXETINE HCL 30 MG PO CPEP: 30.0000 mg | ORAL_CAPSULE | Freq: Every day | ORAL | Status: DC

## 2017-10-13 MED ORDER — DULOXETINE HCL 30 MG PO CPEP
30.0000 mg | ORAL_CAPSULE | Freq: Every day | ORAL | Status: DC
  Administered 2017-10-14: 30 mg via ORAL
  Filled 2017-10-13: qty 1

## 2017-10-13 NOTE — ED Notes (Signed)
Patient refused to take any short acting insulin and wants to do only one time accu check.Patient states "I want to go with what my doctor said."Patient denies SI,HI and AVH.Verbalized that she is depressed about her situations.Support and encouragement given.Maintained safety with Q checks.

## 2017-10-13 NOTE — H&P (Signed)
Psychiatric Admission Assessment Adult  Patient Identification: Laura Small MRN:  [id number] Date of Evaluation:  10/13/2017 Chief Complaint:  depression Principal Diagnosis: Severe major depression, single episode, without psychotic features ([place]) Diagnosis:   Patient Active Problem List   Diagnosis Date Noted  . Severe major depression, single episode, without psychotic features ([place]) [F32.2] 10/13/2017    Priority: High  . Depression, major, single episode, severe ([place]) [F32.2] 10/12/2017  . Muscle spasms of both lower extremities [M62.838] 10/12/2017  . Aortic atherosclerosis ([place]) [I70.0] 01/08/2017  . Tobacco abuse [Z72.0] 01/08/2017  . Ileitis [K52.9] 01/08/2017  . DM (diabetes mellitus), type 2 ([place]) [E11.9] 09/23/2016  . Hyperlipidemia [E78.5] 09/23/2016  . Chronic low back pain [M54.5, G89.29] 09/08/2016   History of Present Illness:   Identifying data. Laura Small is a 57 year old female with no past psychiatric history.  Chief complaint. "I have nothing to live for."  History of present illness. Information was obtained from the patient and the chart. The patient was send to the ER from her doctor's office after she disclosed suicidal ideation. She did not have a clear plan. The patient reports many stressors including chronic pain that she is unable to address due to bad financial situation and lack of health insurance, difficult housing situation, family conflict and separation from her husband. She became increasingly depressed with poor sleep, decreased appetite, anhedonia, feeling of hopelessness helplessness and guilt, poor energy and concentration, anhedonia, crying spells, social isolation and now suicidal thinking. She denies psychotic symptoms or symptoms suggestive of bipolar mania. There is no alcohol or substances involved.  Past psychiatric history. She denies prior hospitalizations or treatments. No suicide attempts.  Family psychiatric history. None  reported.  Social history. The patient has a history of remote back surgery and believes that in November of 2017 her back "snapped" and she started experiencing excruciating pain affecting her muscle strenght and ability to walk. She did have MRI at [facility] but was told that no surgery was indicated. She received multiple courses of steroids that led to diabetes. She considers herself disabled. After her husband left her, she moved in with her brother.   Total Time spent with patient: 1 hour  Is the patient at risk to self? Yes.    Has the patient been a risk to self in the past 6 months? No.  Has the patient been a risk to self within the distant past? No.  Is the patient a risk to others? No.  Has the patient been a risk to others in the past 6 months? No.  Has the patient been a risk to others within the distant past? No.   Prior Inpatient Therapy:   Prior Outpatient Therapy:    Alcohol Screening:   Substance Abuse History in the last 12 months:  No. Consequences of Substance Abuse: NA Previous Psychotropic Medications: No  Psychological Evaluations: No  Past Medical History:  Past Medical History:  Diagnosis Date  . Back pain   . Chicken pox   . Diabetes mellitus without complication ([place])   . Migraines   . PONV (postoperative nausea and vomiting)    usually has 1 episode of vomiting within 1 hour of anesthesia     Past Surgical History:  Procedure Laterality Date  . BACK SURGERY    . CHOLECYSTECTOMY    . INSERTION OF MESH N/A 02/12/2015   Procedure: INSERTION OF MESH;  Surgeon: Sherri Rad, MD;  Location: [facility] ORS;  Service: General;  Laterality: N/A;  .  VENTRAL HERNIA REPAIR N/A 02/12/2015   Procedure: HERNIA REPAIR VENTRAL ADULT;  Surgeon: Sherri Rad, MD;  Location: [facility] ORS;  Service: General;  Laterality: N/A;   Family History:  Family History  Problem Relation Age of Onset  . Diabetes Mother   . Gallbladder disease Mother   . Heart disease Father   .  Alcohol abuse Father   . Gallbladder disease Father   . Heart disease Paternal Grandmother   . Colon cancer Neg Hx    Tobacco Screening:   Social History:  Social History   Substance and Sexual Activity  Alcohol Use No     Social History   Substance and Sexual Activity  Drug Use No    Additional Social History:                           Allergies:   Allergies  Allergen Reactions  . Bee Venom Anaphylaxis and Swelling  . Advil [Ibuprofen] Swelling  . Jardiance [Empagliflozin] Rash  . Nitroglycerin Other (See Comments)    Pt states "causes me to feel off and weird all over and pressure in hands"  . Prednisone Swelling   Lab Results:  Results for orders placed or performed during the hospital encounter of 10/12/17 (from the past 48 hour(s))  Comprehensive metabolic panel     Status: Abnormal   Collection Time: 10/12/17  3:16 PM  Result Value Ref Range   Sodium 136 135 - 145 mmol/L   Potassium 3.7 3.5 - 5.1 mmol/L   Chloride 105 101 - 111 mmol/L   CO2 17 (L) 22 - 32 mmol/L   Glucose, Bld 126 (H) 65 - 99 mg/dL   BUN 10 6 - 20 mg/dL   Creatinine, Ser 0.63 0.44 - 1.00 mg/dL   Calcium 9.2 8.9 - 10.3 mg/dL   Total Protein 8.0 6.5 - 8.1 g/dL   Albumin 3.9 3.5 - 5.0 g/dL   AST 28 15 - 41 U/L   ALT 19 14 - 54 U/L   Alkaline Phosphatase 119 38 - 126 U/L   Total Bilirubin 0.9 0.3 - 1.2 mg/dL   GFR calc non Af Amer >60 >60 mL/min   GFR calc Af Amer >60 >60 mL/min    Comment: (NOTE) The eGFR has been calculated using the CKD EPI equation. This calculation has not been validated in all clinical situations. eGFR's persistently <60 mL/min signify possible Chronic Kidney Disease.    Anion gap 14 5 - 15    Comment: Performed at [facility], [address] [place], [place] [address]  Ethanol     Status: None   Collection Time: 10/12/17  3:16 PM  Result Value Ref Range   Alcohol, Ethyl (B) <10 <10 mg/dL    Comment:        LOWEST DETECTABLE LIMIT  FOR SERUM ALCOHOL IS 10 mg/dL FOR MEDICAL PURPOSES ONLY Performed at [facility], [address] [place], [place] [address]   Salicylate level     Status: None   Collection Time: 10/12/17  3:16 PM  Result Value Ref Range   Salicylate Lvl <7.6 2.8 - 30.0 mg/dL    Comment: Performed at [facility], [address] [place], [place] [address]  Acetaminophen level     Status: Abnormal   Collection Time: 10/12/17  3:16 PM  Result Value Ref Range   Acetaminophen (Tylenol), Serum <10 (L) 10 - 30 ug/mL    Comment:  THERAPEUTIC CONCENTRATIONS VARY SIGNIFICANTLY. A RANGE OF 10-30 ug/mL MAY BE AN EFFECTIVE CONCENTRATION FOR MANY PATIENTS. HOWEVER, SOME ARE BEST TREATED AT CONCENTRATIONS OUTSIDE THIS RANGE. ACETAMINOPHEN CONCENTRATIONS >150 ug/mL AT 4 HOURS AFTER INGESTION AND >50 ug/mL AT 12 HOURS AFTER INGESTION ARE OFTEN ASSOCIATED WITH TOXIC REACTIONS. Performed at [facility], [address] [place], [place] [address]   cbc     Status: Abnormal   Collection Time: 10/12/17  3:16 PM  Result Value Ref Range   WBC 10.1 3.6 - 11.0 K/uL   RBC 5.44 (H) 3.80 - 5.20 MIL/uL   Hemoglobin 15.5 12.0 - 16.0 g/dL   HCT 46.4 35.0 - 47.0 %   MCV 85.2 80.0 - 100.0 fL   MCH 28.4 26.0 - 34.0 pg   MCHC 33.3 32.0 - 36.0 g/dL   RDW 14.2 11.5 - 14.5 %   Platelets 397 150 - 440 K/uL    Comment: Performed at [facility], [address] [place] [address] [place], [place] [address]  Urine Drug Screen, Qualitative     Status: None   Collection Time: 10/12/17  3:21 PM  Result Value Ref Range   Tricyclic, Ur Screen NONE DETECTED NONE DETECTED   Amphetamines, Ur Screen NONE DETECTED NONE DETECTED   MDMA (Ecstasy)Ur Screen NONE DETECTED NONE DETECTED   Cocaine Metabolite,Ur [place] NONE DETECTED NONE DETECTED   Opiate, Ur Screen NONE DETECTED NONE DETECTED   Phencyclidine (PCP) Ur S NONE DETECTED NONE DETECTED   Cannabinoid 50 Ng, Ur [place] NONE DETECTED NONE DETECTED    Barbiturates, Ur Screen NONE DETECTED NONE DETECTED   Benzodiazepine, Ur Scrn NONE DETECTED NONE DETECTED   Methadone Scn, Ur NONE DETECTED NONE DETECTED    Comment: (NOTE) Tricyclics + metabolites, urine    Cutoff 1000 ng/mL Amphetamines + metabolites, urine  Cutoff 1000 ng/mL MDMA (Ecstasy), urine              Cutoff 500 ng/mL Cocaine Metabolite, urine          Cutoff 300 ng/mL Opiate + metabolites, urine        Cutoff 300 ng/mL Phencyclidine (PCP), urine         Cutoff 25 ng/mL Cannabinoid, urine                 Cutoff 50 ng/mL Barbiturates + metabolites, urine  Cutoff 200 ng/mL Benzodiazepine, urine              Cutoff 200 ng/mL Methadone, urine                   Cutoff 300 ng/mL The urine drug screen provides only a preliminary, unconfirmed analytical test result and should not be used for non-medical purposes. Clinical consideration and professional judgment should be applied to any positive drug screen result due to possible interfering substances. A more specific alternate chemical method must be used in order to obtain a confirmed analytical result. Gas chromatography / mass spectrometry (GC/Laura) is the preferred confirmat ory method. Performed at [facility], [address] [place], [place] [address]   Glucose, capillary     Status: Abnormal   Collection Time: 10/12/17  8:30 PM  Result Value Ref Range   Glucose-Capillary 165 (H) 65 - 99 mg/dL  Glucose, capillary     Status: Abnormal   Collection Time: 10/13/17  6:43 AM  Result Value Ref Range   Glucose-Capillary 126 (H) 65 - 99 mg/dL  Glucose, capillary     Status: Abnormal  Collection Time: 10/13/17  8:28 AM  Result Value Ref Range   Glucose-Capillary 137 (H) 65 - 99 mg/dL    Blood Alcohol level:  Lab Results  Component Value Date   ETH <10 07/37/1062    Metabolic Disorder Labs:  Lab Results  Component Value Date   HGBA1C 6.3 08/17/2017   MPG 375 12/15/2016   No results found for:  PROLACTIN Lab Results  Component Value Date   CHOL 249 (H) 09/08/2016   TRIG 237.0 (H) 09/08/2016   HDL 24.40 (L) 09/08/2016   CHOLHDL 10 09/08/2016   VLDL 47.4 (H) 09/08/2016    Current Medications: Current Facility-Administered Medications  Medication Dose Route Frequency Provider Last Rate Last Dose  . acetaminophen (TYLENOL) tablet 650 mg  650 mg Oral Q6H PRN Clapacs, John T, MD      . alum & mag hydroxide-simeth (MAALOX/MYLANTA) 200-200-20 MG/5ML suspension 30 mL  30 mL Oral Q4H PRN Clapacs, Madie Reno, MD      . aspirin EC tablet 81 mg  81 mg Oral Daily Clapacs, Madie Reno, MD      . Derrill Memo ON 10/14/2017] DULoxetine (CYMBALTA) DR capsule 30 mg  30 mg Oral Daily Clapacs, John T, MD      . insulin aspart (novoLOG) injection 0-15 Units  0-15 Units Subcutaneous TID WC Clapacs, Madie Reno, MD      . Derrill Memo ON 10/14/2017] insulin glargine (LANTUS) injection 40 Units  40 Units Subcutaneous Daily Clapacs, John T, MD      . magnesium hydroxide (MILK OF MAGNESIA) suspension 30 mL  30 mL Oral Daily PRN Clapacs, John T, MD      . metFORMIN (GLUCOPHAGE) tablet 500 mg  500 mg Oral BID WC Clapacs, John T, MD      . traZODone (DESYREL) tablet 100 mg  100 mg Oral QHS PRN Clapacs, Madie Reno, MD       PTA Medications: Medications Prior to Admission  Medication Sig Dispense Refill Last Dose  . aspirin EC 81 MG tablet Take 1 tablet (81 mg total) by mouth daily.   Taking  . Insulin Glargine (BASAGLAR KWIKPEN) 100 UNIT/ML SOPN Inject 0.4 mLs (40 Units total) into the skin every morning. Increase per instructions. 5 pen 6 Taking  . Insulin Pen Needle 32G X 6 MM MISC Use as directed for once daily insulin. E11.9 100 each 3 Taking  . metFORMIN (GLUCOPHAGE XR) 500 MG 24 hr tablet Take 1 tablet (500 mg total) by mouth 2 (two) times daily. 60 tablet 3 Taking    Musculoskeletal: Strength & Muscle Tone: decreased Gait & Station: normal Patient leans: N/A  Psychiatric Specialty Exam: I reviewed physical exam performed in  the ER and agree with the findings. Physical Exam  Nursing note and vitals reviewed. Psychiatric: Her speech is normal. Judgment normal. Her affect is blunt. She is withdrawn. Cognition and memory are normal. She exhibits a depressed mood. She expresses suicidal ideation. She expresses suicidal plans.    Review of Systems  Musculoskeletal: Positive for back pain.  Neurological: Positive for focal weakness.  Psychiatric/Behavioral: Positive for depression and suicidal ideas.  All other systems reviewed and are negative.   There were no vitals taken for this visit.There is no height or weight on file to calculate BMI.  See SRA  Treatment Plan Summary: Daily contact with patient to assess and evaluate symptoms and progress in treatment and Medication management   Laura. Birkland is a 57 year old female with chronic pain and no past psychiatric history admitted for suicidal ideation.  #Suicidal ideation -patient able to contract for safety in the hospital  #Mood -start Cymbalta 30 mg daily -Trazodone 100 mg nightly  #DM -Lantus 40 unitd daily, SSI, ADA diet, BS monitoring -Metformin 500 mg BID  #Chronic pain -Cymbalta  #Metabolic syndrome monitoring -Lipid panel, TSH and HgbA1C are pending -EKG, pending -pregnancy test  #Disposition -discharge to home with her brother -follow up with [facility]   Observation Level/Precautions:  15 minute checks  Laboratory:  CBC Chemistry Profile UDS UA  Psychotherapy:    Medications:    Consultations:    Discharge Concerns:    Estimated LOS:  Other:     Physician Treatment Plan for Primary Diagnosis: Severe major depression, single episode, without psychotic features ([place]) Long Term Goal(s): Improvement in symptoms so as ready for discharge  Short Term Goals: Ability to identify changes in lifestyle to reduce recurrence of condition will improve, Ability to  verbalize feelings will improve, Ability to disclose and discuss suicidal ideas, Ability to demonstrate self-control will improve, Ability to identify and develop effective coping behaviors will improve, Ability to maintain clinical measurements within normal limits will improve and Ability to identify triggers associated with substance abuse/mental health issues will improve  Physician Treatment Plan for Secondary Diagnosis: Principal Problem:   Severe major depression, single episode, without psychotic features ([place]) Active Problems:   Chronic low back pain   DM (diabetes mellitus), type 2 ([place])  Long Term Goal(s): NA  Short Term Goals: NA  I certify that inpatient services furnished can reasonably be expected to improve the patient's condition.    Orson Slick, MD 1/22/20193:07 PM

## 2017-10-13 NOTE — Tx Team (Signed)
Initial Treatment Plan 10/13/2017 6:18 PM Laura Small [id number]    PATIENT STRESSORS: Financial difficulties Health problems Marital or family conflict Occupational concerns   PATIENT STRENGTHS: Ability for insight Active sense of humor Average or above average intelligence Motivation for treatment/growth   PATIENT IDENTIFIED PROBLEMS: Improved mood  Financial difficulties   Stabilization on medications                 DISCHARGE CRITERIA:  Improved stabilization in mood, thinking, and/or behavior  PRELIMINARY DISCHARGE PLAN: Return to previous living arrangement  PATIENT/FAMILY INVOLVEMENT: This treatment plan has been presented to and reviewed with the patient, Laura Small, and/or family member, .  The patient and family have been given the opportunity to ask questions and make suggestions.  Elige RadonCobb, Beckie Viscardi B, RN 10/13/2017, 6:18 PM

## 2017-10-13 NOTE — BH Assessment (Signed)
[facility] by Dr. Toni Amendlapacs.  Attending Physician will be Dr. Jennet MaduroPucilowska.   Patient has been assigned to room 316-A, [facility] Charge Nurse Phyliss.   Intake Paper Work has been signed and placed on patient chart.  ER staff is aware of the admission ( ER Sect. Acquanetta BellingGlenda; Dr.KINNER, ER MD; Patient's Nurse Britta MccreedyBarbara & Patient Access; Ivin BootyJoshua).

## 2017-10-13 NOTE — Progress Notes (Signed)
Patient admitted to room 316 a, two nurses myself and Environmental managerGwen RN assessed skin and also for contraband, patient without any contraband, and no skin breakdown or issues, Patient v/s obtained, Patient is alert and oriented: she is depressed, sad, flat affect, states that she was feeling Si when she came in without a plan, but states now " I do not want to die, but I'm tired, and I think things would be better if I was not here, nurse talked to her about self assurance, and inner strength. Patient teary eyed at times, and states that she is going to try to reflect while she is here and hopes to have better coping skills. Patient with q 15 minute checks and camera surveillance in progress for safety.

## 2017-10-13 NOTE — ED Notes (Signed)
Patient alert and stable.Transfered to BMU.

## 2017-10-13 NOTE — BHH Suicide Risk Assessment (Signed)
Laura Small Admission Suicide Risk Assessment   Nursing information obtained from:    Demographic factors:    Current Mental Status:    Loss Factors:    Historical Factors:    Risk Reduction Factors:     Total Time spent with patient: 1 hour Principal Problem: Severe major depression, single episode, without psychotic features ([facility]) Diagnosis:   Patient Active Problem List   Diagnosis Date Noted  . Severe major depression, single episode, without psychotic features ([facility]) [F32.2] 10/13/2017    Priority: High  . Depression, major, single episode, severe ([facility]) [F32.2] 10/12/2017  . Muscle spasms of both lower extremities [M62.838] 10/12/2017  . Aortic atherosclerosis ([facility]) [I70.0] 01/08/2017  . Tobacco abuse [Z72.0] 01/08/2017  . Ileitis [K52.9] 01/08/2017  . DM (diabetes mellitus), type 2 ([facility]) [E11.9] 09/23/2016  . Hyperlipidemia [E78.5] 09/23/2016  . Chronic low back pain [M54.5, G89.29] 09/08/2016   Subjective Data: suicidal ideation  Continued Clinical Symptoms:    The "Alcohol Use Disorders Identification Test", Guidelines for Use in Primary Care, Second Edition.  World Science writerHealth Organization [facility](WHO). Score between 0-7:  no or low risk or alcohol related problems. Score between 8-15:  moderate risk of alcohol related problems. Score between 16-19:  high risk of alcohol related problems. Score 20 or above:  warrants further diagnostic evaluation for alcohol dependence and treatment.   CLINICAL FACTORS:   Depression:   Severe Chronic Pain Medical Diagnoses and Treatments/Surgeries   Musculoskeletal: Strength & Muscle Tone: decreased Gait & Station: normal Patient leans: N/A  Psychiatric Specialty Exam: Physical Exam  Nursing note and vitals reviewed. Psychiatric: Her speech is normal. Judgment normal. Her affect is blunt. She is withdrawn. Cognition and memory are normal. She exhibits a depressed mood. She expresses suicidal ideation. She expresses suicidal plans.    Review  of Systems  Musculoskeletal: Positive for back pain.  Neurological: Positive for focal weakness.  Psychiatric/Behavioral: Positive for depression and suicidal ideas.  All other systems reviewed and are negative.   There were no vitals taken for this visit.There is no height or weight on file to calculate BMI.  General Appearance: Fairly Groomed  Eye Contact:  Good  Speech:  Clear and Coherent  Volume:  Decreased  Mood:  Depressed and Hopeless  Affect:  Blunt  Thought Process:  Goal Directed and Descriptions of Associations: Intact  Orientation:  Full (Time, Place, and Person)  Thought Content:  WDL  Suicidal Thoughts:  Yes.  with intent/plan  Homicidal Thoughts:  No  Memory:  Immediate;   Fair Recent;   Fair Remote;   Fair  Judgement:  Impaired  Insight:  Shallow  Psychomotor Activity:  Psychomotor Retardation  Concentration:  Concentration: Fair and Attention Span: Fair  Recall:  FiservFair  Fund of Knowledge:  Fair  Language:  Fair  Akathisia:  No  Handed:  Right  AIMS (if indicated):     Assets:  Communication Skills Desire for Improvement Housing Resilience Social Support  ADL's:  Intact  Cognition:  WNL  Sleep:         COGNITIVE FEATURES THAT CONTRIBUTE TO RISK:  None    SUICIDE RISK:   Moderate:  Frequent suicidal ideation with limited intensity, and duration, some specificity in terms of plans, no associated intent, good self-control, limited dysphoria/symptomatology, some risk factors present, and identifiable protective factors, including available and accessible social support.  PLAN OF CARE: hospital admission, medication management, discharge planning.  Laura Small is a 57 year old female with chronic pain and no past psychiatric  history admitted for suicidal ideation.  #Suicidal ideation -patient able to contract for safety in the hospital  #Mood -start Cymbalta 30 mg daily -Trazodone 100 mg nightly  #DM -Lantus 40 unitd daily, SSI, ADA diet, BS  monitoring -Metformin 500 mg BID  #Chronic pain -Cymbalta  #Metabolic syndrome monitoring -Lipid panel, TSH and HgbA1C are pending -EKG, pending -pregnancy test  #Disposition -discharge to home with her brother -follow up with [facility]    I certify that inpatient services furnished can reasonably be expected to improve the patient's condition.   Kristine Linea, MD 10/13/2017, 3:00 PM
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
## 2017-10-14 LAB — LIPID PANEL
Cholesterol: 282 mg/dL — ABNORMAL HIGH (ref 0–200)
HDL: 30 mg/dL — AB (ref >40)
LDL CALC: 221 mg/dL — AB (ref 0–99)
TRIGLYCERIDES: 157 mg/dL — AB (ref <150)
Total CHOL/HDL Ratio: 9.4 RATIO
VLDL: 31 mg/dL (ref 0–40)

## 2017-10-14 LAB — TSH: TSH: 4.317 u[IU]/mL (ref 0.350–4.500)

## 2017-10-14 LAB — HEMOGLOBIN A1C
HEMOGLOBIN A1C: 6.5 % — AB (ref 4.8–5.6)
Mean Plasma Glucose: 139.85 mg/dL

## 2017-10-14 LAB — GLUCOSE, CAPILLARY: Glucose-Capillary: 117 mg/dL — ABNORMAL HIGH (ref 65–99)

## 2017-10-14 LAB — HCG, QUANTITATIVE, PREGNANCY: HCG, BETA CHAIN, QUANT, S: 2 m[IU]/mL (ref <5)

## 2017-10-14 MED ORDER — DULOXETINE HCL 30 MG PO CPEP
60.0000 mg | ORAL_CAPSULE | Freq: Every day | ORAL | Status: DC
  Administered 2017-10-15 – 2017-10-16 (×2): 60 mg via ORAL
  Filled 2017-10-14 (×2): qty 2

## 2017-10-14 MED ORDER — METHOCARBAMOL 500 MG PO TABS
500.0000 mg | ORAL_TABLET | Freq: Three times a day (TID) | ORAL | Status: DC
  Administered 2017-10-14 – 2017-10-16 (×6): 500 mg via ORAL
  Filled 2017-10-14 (×7): qty 1

## 2017-10-14 MED ORDER — ETODOLAC 200 MG PO CAPS
200.0000 mg | ORAL_CAPSULE | Freq: Two times a day (BID) | ORAL | Status: DC
  Administered 2017-10-14 – 2017-10-16 (×4): 200 mg via ORAL
  Filled 2017-10-14 (×5): qty 1
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
## 2017-10-14 NOTE — Progress Notes (Signed)
Recreation Therapy Notes  Date: 01.23.2019  Time: 1:00 PM  Location: Craft Room  Behavioral response: Appropriate   Intervention Topic: Team work  Discussion/Intervention: Group content on today was focused on teamwork. The group identified what teamwork is. Individuals described who is a part of their team. Patients expressed why they thought teamwork is important. The group stated reasons why they thought it was easier to work with a Comptrollersmaller/larger team. Individuals discussed some positives and negatives of working with a team. Patients gave examples of past experiences they had while working with a team. The group participated in the intervention "Save the MagnoliaBalls", patients were in groups and had to keep the balls on the surface given.  Clinical Observations/Feedback:  Patient came to group and identified her brother as apart of her team.She stated working as a group keeps you on tract. Individual participated in then intervention and was social with peers and staff during group.    Jon LRT/CTRS         Sasha Rueth 10/14/2017 2:01 PM

## 2017-10-14 NOTE — Progress Notes (Signed)
Patient ID: Laura Small, female   DOB: 09/15/1961, 57 y.o.   MRN: [id number] Initially observed in room, approachable, "I wanted to end it all before I came in, but I don't feel like that now, because I have some hope since I came in...." Later on, patient was crying, kneeling down outside her door asking for Oxycodone for lower back pain 10/10, "I can't walk..." WC provided, Tylenol 650 mg PO, PRN and Trazodone 100 mg given and redirected back to bed.

## 2017-10-14 NOTE — Progress Notes (Signed)
Recreation Therapy Notes  Date: 01.23 .2018  Time: 3:00pm  Location: Craft room  Behavioral response: Appropriate  Group Type: Craft  Participation level: Active  Communication: Patient was social with peers and staff.  Comments: Patient left group due to unknown reasons and never returned.   Nikeya Maxim LRT/CTRS        Azzam Mehra 10/14/2017 4:11 PM

## 2017-10-14 NOTE — BHH Group Notes (Signed)
10/14/2017 9:30AM  Type of Therapy/Topic:  Group Therapy:  Emotion Regulation  Participation Level:  Active   Description of Group:   The purpose of this group is to assist patients in learning to regulate negative emotions and experience positive emotions. Patients will be guided to discuss ways in which they have been vulnerable to their negative emotions. These vulnerabilities will be juxtaposed with experiences of positive emotions or situations, and patients will be challenged to use positive emotions to combat negative ones. Special emphasis will be placed on coping with negative emotions in conflict situations, and patients will process healthy conflict resolution skills.  Therapeutic Goals: 1. Patient will identify two positive emotions or experiences to reflect on in order to balance out negative emotions 2. Patient will label two or more emotions that they find the most difficult to experience 3. Patient will demonstrate positive conflict resolution skills through discussion and/or role plays  Summary of Patient Progress: Actively and appropriately engaged in the group. Patient was able to provide support and validation to other group members.Patient practiced active listening when interacting with the facilitator and other group members Patient in still in the process of obtaining treatment goals. Carollee HerterShannon spoke about feeling helpless in her marriage. She says that her husband will always argue with her because he always worries about money. She says "I just walk away, go get a sip of water and then go craft."    Therapeutic Modalities:   Cognitive Behavioral Therapy Feelings Identification Dialectical Behavioral Therapy   Johny ShearsCassandra  Shaylen Nephew, LCSW 10/14/2017 10:22 AM

## 2017-10-14 NOTE — Progress Notes (Signed)
[facility] MD Progress Note  10/14/2017 11:20 AM Laura Small  MRN:  [id number]  Subjective:    Laura Small met with treatment team today. She reports some improvement. She slept better, affect is brighter. She uses a wheel chair here and reportedly at home. Last night she complained bitterly about excruciating back pain demending narcotics. There are degenerative changes in the lower spine as evidenced by MRI performed at [facility] in the spring of 2018.  Treatment plan. We will increase Cymbalta to 60 mg daily and start Lodine and Robaxin for back problems.    Social/disposition. Discharge to home with family. Follow up at [facility].   Principal Problem: Severe major depression, single episode, without psychotic features ([place]) Diagnosis:   Patient Active Problem List   Diagnosis Date Noted  . Severe major depression, single episode, without psychotic features ([place]) [F32.2] 10/13/2017    Priority: High  . Depression, major, single episode, severe ([place]) [F32.2] 10/12/2017  . Muscle spasms of both lower extremities [M62.838] 10/12/2017  . Aortic atherosclerosis ([place]) [I70.0] 01/08/2017  . Tobacco abuse [Z72.0] 01/08/2017  . Ileitis [K52.9] 01/08/2017  . DM (diabetes mellitus), type 2 ([place]) [E11.9] 09/23/2016  . Hyperlipidemia [E78.5] 09/23/2016  . Chronic low back pain [M54.5, G89.29] 09/08/2016   Total Time spent with patient: 30 minutes  Past Psychiatric History: none.  Past Medical History:  Past Medical History:  Diagnosis Date  . Back pain   . Chicken pox   . Diabetes mellitus without complication ([place])   . Migraines   . PONV (postoperative nausea and vomiting)    usually has 1 episode of vomiting within 1 hour of anesthesia     Past Surgical History:  Procedure Laterality Date  . BACK SURGERY    . CHOLECYSTECTOMY    . INSERTION OF MESH N/A 02/12/2015   Procedure: INSERTION OF MESH;  Surgeon: Sherri Rad, MD;  Location: [facility] ORS;  Service: General;  Laterality: N/A;  . VENTRAL  HERNIA REPAIR N/A 02/12/2015   Procedure: HERNIA REPAIR VENTRAL ADULT;  Surgeon: Sherri Rad, MD;  Location: [facility] ORS;  Service: General;  Laterality: N/A;   Family History:  Family History  Problem Relation Age of Onset  . Diabetes Mother   . Gallbladder disease Mother   . Heart disease Father   . Alcohol abuse Father   . Gallbladder disease Father   . Heart disease Paternal Grandmother   . Colon cancer Neg Hx    Family Psychiatric  History: none. Social History:  Social History   Substance and Sexual Activity  Alcohol Use No     Social History   Substance and Sexual Activity  Drug Use No    Social History   Socioeconomic History  . Marital status: Married    Spouse name: None  . Number of children: None  . Years of education: None  . Highest education level: None  Social Needs  . Financial resource strain: None  . Food insecurity - worry: None  . Food insecurity - inability: None  . Transportation needs - medical: None  . Transportation needs - non-medical: None  Occupational History  . None  Tobacco Use  . Smoking status: Former Smoker    Packs/day: 0.00    Types: Cigarettes  . Smokeless tobacco: Never Used  Substance and Sexual Activity  . Alcohol use: No  . Drug use: No  . Sexual activity: Not Currently    Partners: Male  Other Topics Concern  . None  Social History Narrative  .  None   Additional Social History:                         Sleep: Fair  Appetite:  Fair  Current Medications: Current Facility-Administered Medications  Medication Dose Route Frequency Provider Last Rate Last Dose  . acetaminophen (TYLENOL) tablet 650 mg  650 mg Oral Q6H PRN Clapacs, Madie Reno, MD   650 mg at 10/13/17 2147  . alum & mag hydroxide-simeth (MAALOX/MYLANTA) 200-200-20 MG/5ML suspension 30 mL  30 mL Oral Q4H PRN Clapacs, John T, MD      . aspirin EC tablet 81 mg  81 mg Oral Daily Clapacs, Madie Reno, MD   81 mg at 10/14/17 0810  . DULoxetine (CYMBALTA) DR  capsule 30 mg  30 mg Oral Daily Clapacs, Madie Reno, MD   30 mg at 10/14/17 0810  . insulin aspart (novoLOG) injection 0-15 Units  0-15 Units Subcutaneous TID WC Clapacs, John T, MD      . insulin glargine (LANTUS) injection 40 Units  40 Units Subcutaneous Daily Clapacs, Madie Reno, MD   28 Units at 10/14/17 0809  . magnesium hydroxide (MILK OF MAGNESIA) suspension 30 mL  30 mL Oral Daily PRN Clapacs, John T, MD      . metFORMIN (GLUCOPHAGE) tablet 500 mg  500 mg Oral BID WC Clapacs, Madie Reno, MD   500 mg at 10/14/17 0810  . traZODone (DESYREL) tablet 100 mg  100 mg Oral QHS PRN Clapacs, Madie Reno, MD   100 mg at 10/13/17 2147    Lab Results:  Results for orders placed or performed during the hospital encounter of 10/13/17 (from the past 48 hour(s))  Glucose, capillary     Status: Abnormal   Collection Time: 10/14/17  7:05 AM  Result Value Ref Range   Glucose-Capillary 117 (H) 65 - 99 mg/dL   Comment 1 Notify RN   Lipid panel     Status: Abnormal   Collection Time: 10/14/17  7:07 AM  Result Value Ref Range   Cholesterol 282 (H) 0 - 200 mg/dL   Triglycerides 157 (H) <150 mg/dL   HDL 30 (L) >40 mg/dL   Total CHOL/HDL Ratio 9.4 RATIO   VLDL 31 0 - 40 mg/dL   LDL Cholesterol 221 (H) 0 - 99 mg/dL    Comment:        Total Cholesterol/HDL:CHD Risk Coronary Heart Disease Risk Table                     Men   Women  1/2 Average Risk   3.4   3.3  Average Risk       5.0   4.4  2 X Average Risk   9.6   7.1  3 X Average Risk  23.4   11.0        Use the calculated Patient Ratio above and the CHD Risk Table to determine the patient's CHD Risk.        ATP III CLASSIFICATION (LDL):  <100     mg/dL   Optimal  100-129  mg/dL   Near or Above                    Optimal  130-159  mg/dL   Borderline  160-189  mg/dL   High  >190     mg/dL   Very High Performed at [facility], [address], [place], [place] [address]   TSH  Status: None   Collection Time: 10/14/17  7:07 AM  Result Value Ref  Range   TSH 4.317 0.350 - 4.500 uIU/mL    Comment: Performed by a 3rd Generation assay with a functional sensitivity of <=0.01 uIU/mL. Performed at [facility], [address] [place], [place] [address]     Blood Alcohol level:  Lab Results  Component Value Date   [facility] <10 15/01/6978    Metabolic Disorder Labs: Lab Results  Component Value Date   HGBA1C 6.3 08/17/2017   MPG 375 12/15/2016   No results found for: PROLACTIN Lab Results  Component Value Date   CHOL 282 (H) 10/14/2017   TRIG 157 (H) 10/14/2017   HDL 30 (L) 10/14/2017   CHOLHDL 9.4 10/14/2017   VLDL 31 10/14/2017   LDLCALC 221 (H) 10/14/2017    Physical Findings: AIMS:  , ,  ,  ,    CIWA:    COWS:     Musculoskeletal: Strength & Muscle Tone: within normal limits Gait & Station: unable to stand Patient leans: N/A  Psychiatric Specialty Exam: Physical Exam  Nursing note and vitals reviewed. Psychiatric: Her speech is normal and behavior is normal. Cognition and memory are normal. She expresses impulsivity. She exhibits a depressed mood. She expresses suicidal ideation.    Review of Systems  Neurological: Negative.   Psychiatric/Behavioral: Positive for depression and suicidal ideas.  All other systems reviewed and are negative.   Blood pressure 121/71, pulse 82, temperature 98.2 F (36.8 C), temperature source Oral, resp. rate 18, height _0  (1.702 m), weight 104.3 kg (230 lb), SpO2 98 %.Body mass index is 36.02 kg/m.  General Appearance: Disheveled  Eye Contact:  Good  Speech:  Clear and Coherent  Volume:  Normal  Mood:  Dysphoric  Affect:  Congruent  Thought Process:  Goal Directed and Descriptions of Associations: Intact  Orientation:  Full (Time, Place, and Person)  Thought Content:  WDL  Suicidal Thoughts:  Yes.  without intent/plan  Homicidal Thoughts:  No  Memory:  Immediate;   Fair Recent;   Fair Remote;   Fair  Judgement:  Poor  Insight:  Shallow  Psychomotor  Activity:  Normal  Concentration:  Concentration: Fair and Attention Span: Fair  Recall:  [employer] of Knowledge:  Fair  Language:  Fair  Akathisia:  No  Handed:  Right  AIMS (if indicated):     Assets:  Communication Skills Desire for Improvement Housing Resilience Social Support  ADL's:  Intact  Cognition:  WNL  Sleep:  Number of Hours: 6.45     Treatment Plan Summary: Daily contact with patient to assess and evaluate symptoms and progress in treatment and Medication management   Ms. Espericueta is a 57 year old female with chronic pain and no past psychiatric history admitted for suicidal ideation.  #Suicidal ideation -patient able to contract for safety in the hospital  #Mood -increase Cymbalta to mg daily -Trazodone 100 mg nightly  #DM -Lantus 40 unitd daily, SSI, ADA diet, BS monitoring -Metformin 500 mg BID  #Chronic pain -Cymbalta -Lodine 200 mg BID -Robaxin -PT consult  #Metabolic syndrome monitoring -Lipid panel, TSH and HgbA1C are pending -EKG, pending -pregnancy test  #Disposition -discharge to home with her brother -follow up with [facility]      Orson Slick, MD 10/14/2017, 11:20 AM

## 2017-10-14 NOTE — Progress Notes (Signed)
Recreation Therapy Notes  INPATIENT RECREATION THERAPY ASSESSMENT  Patient Details Name: Laura Small MRN: [id number] DOB: 09/20/1961 Today's Date: 10/14/2017  Patient Stressors: Relationship, Work, Other (Comment)(Health, Depressed, SI, Finances)  Coping Skills:   Other (Comment)(Walking, Make jewelry)  Personal Challenges: Communication, Expressing Yourself, Social Interaction, Stress Management, Trusting Others  Leisure Interests (2+):  Community - Counselling psychologistMovies, Music - Listen(Cook, Eat)  Awareness of Community Resources:  Yes  Community Resources:  Park  Current Use: Yes  If no, Barriers?:    Patient Strengths:  Trust worthy, Honest  Patient Identified Areas of Improvement:  Get physically fit  Current Recreation Participation:  Walk  Patient Goal for Hospitalization:  To not feel so helpless  [place]  County of Residence:  [place]   Current SI (including self-harm):  No  Current HI:  No  Consent to Intern Participation: N/A   Lottie Siska 10/14/2017, 4:49 PM

## 2017-10-14 NOTE — Tx Team (Addendum)
Interdisciplinary Treatment and Diagnostic Plan Update  10/14/2017 Time of Session: 10:30 AM Laura Small MRN: [id number]  Principal Diagnosis: Severe major depression, single episode, without psychotic features (HCC)  Secondary Diagnoses: Principal Problem:   Severe major depression, single episode, without psychotic features (HCC) Active Problems:   Chronic low back pain   DM (diabetes mellitus), type 2 (H[facility])   Current Medications:  Current Facility-Administered Medications  Medication Dose Route Frequency Provider Last Rate Last Dose  . acetaminophen (TYLENOL) tablet 650 mg  650 mg Oral Q6H PRN Clapacs, Jackquline Denmark, MD   650 mg at 10/13/17 2147  . alum & mag hydroxide-simeth (MAALOX/MYLANTA) 200-200-20 MG/5ML suspension 30 mL  30 mL Oral Q4H PRN Clapacs, John T, MD      . aspirin EC tablet 81 mg  81 mg Oral Daily Clapacs, Jackquline [place], MD   81 mg at 10/14/17 0810  . [START ON 10/15/2017] DULoxetine (CYMBALTA) DR capsule 60 mg  60 mg Oral Daily Pucilowska, Jolanta B, MD      . etodolac (LODINE) capsule 200 mg  200 mg Oral BID Pucilowska, Jolanta B, MD      . insulin aspart (novoLOG) injection 0-15 Units  0-15 Units Subcutaneous TID WC Clapacs, John T, MD      . insulin glargine (LANTUS) injection 40 Units  40 Units Subcutaneous Daily Clapacs, Jackquline Denmark, MD   28 Units at 10/14/17 0809  . magnesium hydroxide (MILK OF MAGNESIA) suspension 30 mL  30 mL Oral Daily PRN Clapacs, John T, MD      . metFORMIN (GLUCOPHAGE) tablet 500 mg  500 mg Oral BID WC Clapacs, Jackquline Denmark, MD   500 mg at 10/14/17 0810  . methocarbamol (ROBAXIN) tablet 500 mg  500 mg Oral TID Pucilowska, Jolanta B, MD   500 mg at 10/14/17 1407  . traZODone (DESYREL) tablet 100 mg  100 mg Oral QHS PRN Clapacs, Jackquline Denmark, MD   100 mg at 10/13/17 2147   PTA Medications: Medications Prior to Admission  Medication Sig Dispense Refill Last Dose  . aspirin EC 81 MG tablet Take 1 tablet (81 mg total) by mouth daily.   Taking  . Insulin Glargine  (BASAGLAR KWIKPEN) 100 UNIT/ML SOPN Inject 0.4 mLs (40 Units total) into the skin every morning. Increase per instructions. 5 pen 6 Taking  . Insulin Pen Needle 32G X 6 MM MISC Use as directed for once daily insulin. E11.9 100 each 3 Taking  . metFORMIN (GLUCOPHAGE XR) 500 MG 24 hr tablet Take 1 tablet (500 mg total) by mouth 2 (two) times daily. 60 tablet 3 Taking    Patient Stressors: Financial difficulties Health problems Marital or family conflict Occupational concerns  Patient Strengths: Ability for insight Active sense of humor Average or above average intelligence Motivation for treatment/growth  Treatment Modalities: Medication Management, Group therapy, Case management,  1 to 1 session with clinician, Psychoeducation, Recreational therapy.   Physician Treatment Plan for Primary Diagnosis: Severe major depression, single episode, without psychotic features (HCC) Long Term Goal(s): Improvement in symptoms so as ready for discharge NA   Short Term Goals: Ability to identify changes in lifestyle to reduce recurrence of condition will improve Ability to verbalize feelings will improve Ability to disclose and discuss suicidal ideas Ability to demonstrate self-control will improve Ability to identify and develop effective coping behaviors will improve Ability to maintain clinical measurements within normal limits will improve Ability to identify triggers associated with substance abuse/mental health issues will improve NA  Medication  Management: Evaluate patient's response, side effects, and tolerance of medication regimen.  Therapeutic Interventions: 1 to 1 sessions, Unit Group sessions and Medication administration.  Evaluation of Outcomes: Progressing  Physician Treatment Plan for Secondary Diagnosis: Principal Problem:   Severe major depression, single episode, without psychotic features (HCC) Active Problems:   Chronic low back pain   DM (diabetes mellitus), type 2  (HCC)  Long Term Goal(s): Improvement in symptoms so as ready for discharge NA   Short Term Goals: Ability to identify changes in lifestyle to reduce recurrence of condition will improve Ability to verbalize feelings will improve Ability to disclose and discuss suicidal ideas Ability to demonstrate self-control will improve Ability to identify and develop effective coping behaviors will improve Ability to maintain clinical measurements within normal limits will improve Ability to identify triggers associated with substance abuse/mental health issues will improve NA     Medication Management: Evaluate patient's response, side effects, and tolerance of medication regimen.  Therapeutic Interventions: 1 to 1 sessions, Unit Group sessions and Medication administration.  Evaluation of Outcomes: Progressing   RN Treatment Plan for Primary Diagnosis: Severe major depression, single episode, without psychotic features (HCC) Long Term Goal(s): Knowledge of disease and therapeutic regimen to maintain health will improve  Short Term Goals: Ability to disclose and discuss suicidal ideas and Ability to identify and develop effective coping behaviors will improve  Medication Management: RN will administer medications as ordered by provider, will assess and evaluate patient's response and provide education to patient for prescribed medication. RN will report any adverse and/or side effects to prescribing provider.  Therapeutic Interventions: 1 on 1 counseling sessions, Psychoeducation, Medication administration, Evaluate responses to treatment, Monitor vital signs and CBGs as ordered, Perform/monitor CIWA, COWS, AIMS and Fall Risk screenings as ordered, Perform wound care treatments as ordered.  Evaluation of Outcomes: Progressing   LCSW Treatment Plan for Primary Diagnosis: Severe major depression, single episode, without psychotic features (HCC) Long Term Goal(s): Safe transition to appropriate  next level of care at discharge, Engage patient in therapeutic group addressing interpersonal concerns.  Short Term Goals: Engage patient in aftercare planning with referrals and resources, Identify triggers associated with mental health/substance abuse issues and Increase skills for wellness and recovery  Therapeutic Interventions: Assess for all discharge needs, 1 to 1 time with Social worker, Explore available resources and support systems, Assess for adequacy in community support network, Educate family and significant other(s) on suicide prevention, Complete Psychosocial Assessment, Interpersonal group therapy.  Evaluation of Outcomes: Progressing   Progress in Treatment: Attending groups: Yes. Participating in groups: Yes. Taking medication as prescribed: Yes. Toleration medication: Yes. Family/Significant other contact made: No, will contact:  CSW given consent to contact her brother, Deneen HartsDenne Munter Patient understands diagnosis: Yes. Discussing patient identified problems/goals with staff: Yes. Medical problems stabilized or resolved: Yes. Denies suicidal/homicidal ideation: Yes. Issues/concerns per patient self-inventory: No. Other: n/a  New problem(s) identified: No, Describe:  No new problems identified  New Short Term/Long Term Goal(s): Pt identified her goal as "to not feel so helpless" Discharge Plan or Barriers: Tentative discharge plan is for pt to return to her home with follow-up services provided by [facility] and her PCP.  Reason for Continuation of Hospitalization: Depression Medication stabilization  Estimated Length of Stay: 3-5 days  Recreational Therapy: Patient Stressors: Relationship, Work, Other (Comment)(Health, Depressed, SI, Finances) Patient Goal: Patient will identify 3 new healthy leisure opportunities to participate in post discharge x5 days.   Attendees: Patient: Laura Small 10/14/2017 3:30 PM  Physician: Kristine Linea, MD 10/14/2017 3:30  PM  Nursing:  10/14/2017 3:30 PM  RN Care Manager: 10/14/2017 3:30 PM  Social Worker: Huey Romans, LCSW 10/14/2017 3:30 PM  Recreational Therapist: Garret Reddish, LRT 10/14/2017 3:30 PM  Other:  10/14/2017 3:30 PM  Other:  10/14/2017 3:30 PM  Other: 10/14/2017 3:30 PM    Scribe for Treatment Team: Alease Frame, LCSW 10/14/2017 3:30 PM

## 2017-10-14 NOTE — Plan of Care (Signed)
The patient refused to take the 40 units of Lantus prescribed for her, stated, "I take 28 units at home and that's what I'll take while I'm here." 28 units of Lantus administered per patient   preference. Patent is in denial of being depressed and verbalized she wants to live after coming to this place. Safety 15 minutes rounding continuous. Will continue to Progressing Spiritual Needs Ability to function at adequate level 10/14/2017 0958 - Progressing by Crisoforo OxfordKyei, Lawrence Roldan, RN Activity: Sleeping patterns will improve 10/14/2017 0958 - Progressing by Crisoforo OxfordKyei, Cipriana Biller, RN Education: Utilization of techniques to improve thought processes will improve 10/14/2017 0958 - Progressing by Crisoforo OxfordKyei, Kuuipo Anzaldo, RN Coping: Ability to cope will improve 10/14/2017 0958 - Progressing by Crisoforo OxfordKyei, Cassady Turano, RN Ability to verbalize feelings will improve 10/14/2017 0958 - Progressing by Crisoforo OxfordKyei, Valentine Kuechle, RN Health Behavior/Discharge Planning: Ability to make decisions will improve 10/14/2017 0958 - Progressing by Crisoforo OxfordKyei, Manila Rommel, RN Role Relationship: Ability to demonstrate positive changes in social behaviors and relationships will improve 10/14/2017 0958 - Progressing by Crisoforo OxfordKyei, Declyn Delsol, RN Safety: Ability to disclose and discuss suicidal ideas will improve 10/14/2017 0958 - Progressing by Crisoforo OxfordKyei, Lymon Kidney, RN Ability to identify and utilize support systems that promote safety will improve 10/14/2017 0958 - Progressing by Crisoforo OxfordKyei, Timiya Howells, RN Self-Concept: Ability to verbalize positive feelings about self will improve 10/14/2017 0958 - Progressing by Crisoforo OxfordKyei, Dearies Meikle, RN Level of anxiety will decrease 10/14/2017 0958 - Progressing by Crisoforo OxfordKyei, Madell Heino, RN   monitor.

## 2017-10-14 NOTE — Evaluation (Signed)
Physical Therapy Evaluation Patient Details Name: Laura Small MRN: [id number] DOB: Oct 07, 1960 Today's Date: 10/14/2017   History of Present Illness  admitted to [facility] secondary to major depression, suicidial ideation.  Stay complicated by reports of severe, chronic back pain.  Per chart, MRI from 11/2016 significant for L5-S1:  Facet hypertrophy. Complete loss of intervertebral disc space height and a posterior left eccentric disc as well as facet hypertrophy resulting in mild spinal canal narrowing and mild narrowing of the left greater than right subarticular recesses. Disc material may contact the descending left nerve root. There is mild left and no right foraminal narrowing.  Clinical Impression  Upon evaluation, patient alert and oriented; follows all commands and demonstrates fair effort with all activities.  Very focused, somewhat perseverative on pain; question some degree of self-limiting behaviors at times.  Does respond well to encouragement, redirection; may benefit from education on pain and pain control (neuroscience).  Bilat LE strength and ROM grossly WFL for basic transfers and gait (at least 4-/5); some degree of give way weakness noted with isolated MMT.  Reports intermittent radicular symptoms (sharp, shooting pains to bilat LEs), feels she may have noticed a decrease in bladder control at times. Able to complete bed mobility with mod indep; sit/stand, basic transfers and gait (5' forward/backward) with RW, cga/min assist.  Patient very guarded and rigid with all upright mobility; poor dissociation of trunk and LEs noted.  Additional distance limited by pain due to pain/fatigue. Did introduce education regarding back precautions and extension exercises. Patient reporting reduction in pain from 5/10 to 2/10 with extension therex.  Will continue to progress with additional education in subsequent sessions. Would benefit from skilled PT to address above deficits and promote optimal  return to PLOF; Could not be calculated transition to home with outpatient PT follow up as appropriate.    Follow Up Recommendations Outpatient PT(may benefit from referral to [facility] clinic if finances are an issue)    Equipment Recommendations  Rolling walker with 5" wheels    Recommendations for Other Services       Precautions / Restrictions Precautions Precautions: Fall Restrictions Weight Bearing Restrictions: No      Mobility  Bed Mobility Overal bed mobility: Modified Independent             General bed mobility comments: easily transitions to long-sitting and modified 'figure-4' sitting in bed intermittently during evaluation  Transfers Overall transfer level: Needs assistance Equipment used: Rolling walker (2 wheeled) Transfers: Sit to/from Stand Sit to Stand: Min guard         General transfer comment: cuing for hand placement  Ambulation/Gait Ambulation/Gait assistance: Min guard Ambulation Distance (Feet): 5 Feet(forward/backward x2) Assistive device: Rolling walker (2 wheeled)       General Gait Details: very guarded, rigid gait performance with RW; shufflings steps with limited foot clearance, maintaining bilat LEs in extensino throughout efforts.  Additional distance limited by patient due to pain/fatigue.  No buckling, LOB or gross instability noted.  Stairs            Wheelchair Mobility    Modified Rankin (Stroke Patients Only)       Balance Overall balance assessment: Needs assistance Sitting-balance support: No upper extremity supported;Feet supported Sitting balance-Leahy Scale: Good     Standing balance support: Bilateral upper extremity supported Standing balance-Leahy Scale: Fair  Pertinent Vitals/Pain Pain Assessment: 0-10 Pain Score: 5  Pain Location: back Pain Descriptors / Indicators: Aching Pain Intervention(s): Limited activity within patient's tolerance;Monitored during  session;Repositioned;Premedicated before session    Home Living Family/patient expects to be discharged to:: Private residence Living Arrangements: Other relatives(living with brother) Available Help at Discharge: Family Type of Home: House Home Access: Stairs to enter Entrance Stairs-Rails: None Entrance Stairs-Number of Steps: 5 Home Layout: Bed/bath upstairs;Two level Home Equipment: Walker - 4 wheels;Wheelchair - manual      Prior Function Level of Independence: Independent with assistive device(s)         Comments: Mod indep with ADLs and limited household mobility ("furniture cruising"); WC level for longer, community distances due to chronic back pain.  Does endorse at least single fall within previous six months.     Hand Dominance        Extremity/Trunk Assessment   Upper Extremity Assessment Upper Extremity Assessment: Overall WFL for tasks assessed    Lower Extremity Assessment Lower Extremity Assessment: (bilat LEs grossly 4-/5 throughout, some give-way weakness appreciated.  Does endorse sharp, shooting pain in bilat LEs intermittently)       Communication   Communication: No difficulties  Cognition Arousal/Alertness: Awake/alert Behavior During Therapy: WFL for tasks assessed/performed Overall Cognitive Status: Within Functional Limits for tasks assessed                                        General Comments      Exercises Other Exercises Other Exercises: Educated in back precautions and functional implications for positioning/protection and overall pain control. Patient voiced understanding. Other Exercises: Introduced gentle lumbar extension/anterior pelvic tilt exercises in sitting edge of bed, 2x10; min cuing for technique and alignment to maximize effectiveness.  Patient reporting reduction in pain from 5/10 to 2/10 with extension therex, performing repeated sit/stand with greater ease and fluidity afterwards.   Assessment/Plan     PT Assessment Patient needs continued PT services  PT Problem List Decreased strength;Decreased range of motion;Decreased activity tolerance;Decreased mobility;Decreased knowledge of precautions;Pain       PT Treatment Interventions DME instruction;Gait training;Stair training;Functional mobility training;Therapeutic activities;Therapeutic exercise;Balance training;Patient/family education    PT Goals (Current goals can be found in the Care Plan section)  Acute Rehab PT Goals Patient Stated Goal: to make this pain better PT Goal Formulation: With patient Time For Goal Achievement: 10/28/17 Potential to Achieve Goals: Good    Frequency Min 2X/week   Barriers to discharge Decreased caregiver support      Co-evaluation               AM-PAC PT "6 Clicks" Daily Activity  Outcome Measure Difficulty turning over in bed (including adjusting bedclothes, sheets and blankets)?: None Difficulty moving from lying on back to sitting on the side of the bed? : None Difficulty sitting down on and standing up from a chair with arms (e.g., wheelchair, bedside commode, etc,.)?: A Little Help needed moving to and from a bed to chair (including a wheelchair)?: A Little Help needed walking in hospital room?: A Little Help needed climbing 3-5 steps with a railing? : A Little 6 Click Score: 20    End of Session Equipment Utilized During Treatment: Gait belt Activity Tolerance: Patient tolerated treatment well Patient left: in bed;with call bell/phone within reach   PT Visit Diagnosis: Muscle weakness (generalized) (M62.81);Pain;Difficulty in walking, not elsewhere classified (R26.2) Pain -  Right/Left: (back)    Time: [id number] PT Time Calculation (min) (ACUTE ONLY): 18 min   Charges:   PT Evaluation $PT Eval Low Complexity: 1 Low PT Treatments $Therapeutic Exercise: 8-22 mins   PT G Codes:        Jaquavis Felmlee H. Manson PasseyBrown, PT, DPT, NCS 10/14/17, 5:17 PM [phone]

## 2017-10-14 NOTE — Plan of Care (Signed)
Patient slept for Estimated Hours of 6.45; Precautionary checks every 15 minutes for safety maintained, room free of safety hazards, patient sustains no injury or falls during this shift.  

## 2017-10-15 LAB — GLUCOSE, CAPILLARY: GLUCOSE-CAPILLARY: 114 mg/dL — AB (ref 65–99)

## 2017-10-15 MED ORDER — DULOXETINE HCL 60 MG PO CPEP: 60.0000 mg | ORAL_CAPSULE | Freq: Every day | ORAL | 1 refills | Status: DC

## 2017-10-15 MED ORDER — BASAGLAR KWIKPEN 100 UNIT/ML ~~LOC~~ SOPN: 40.0000 [IU] | PEN_INJECTOR | Freq: Every morning | SUBCUTANEOUS | 6 refills | Status: DC

## 2017-10-15 MED ORDER — METHOCARBAMOL 500 MG PO TABS: 500.0000 mg | ORAL_TABLET | Freq: Three times a day (TID) | ORAL | 1 refills | Status: DC

## 2017-10-15 MED ORDER — ASPIRIN EC 81 MG PO TBEC: 81.0000 mg | DELAYED_RELEASE_TABLET | Freq: Every day | ORAL | 1 refills | Status: DC

## 2017-10-15 MED ORDER — ETODOLAC 200 MG PO CAPS: 200.0000 mg | ORAL_CAPSULE | Freq: Two times a day (BID) | ORAL | 1 refills | Status: DC

## 2017-10-15 MED ORDER — TRAZODONE HCL 100 MG PO TABS: 100.0000 mg | ORAL_TABLET | Freq: Every evening | ORAL | 1 refills | Status: DC | PRN

## 2017-10-15 MED ORDER — INSULIN GLARGINE 100 UNIT/ML ~~LOC~~ SOLN
28.0000 [IU] | Freq: Every day | SUBCUTANEOUS | Status: DC
  Administered 2017-10-16: 28 [IU] via SUBCUTANEOUS
  Filled 2017-10-15 (×2): qty 0.28

## 2017-10-15 MED ORDER — INSULIN PEN NEEDLE 32G X 6 MM MISC
3 refills | Status: DC
Start: 2017-10-15 — End: 2018-06-11

## 2017-10-15 MED ORDER — METFORMIN HCL ER 500 MG PO TB24: 500.0000 mg | ORAL_TABLET | Freq: Two times a day (BID) | ORAL | 3 refills | Status: DC

## 2017-10-15 NOTE — Progress Notes (Signed)
Patient refused CBG monitoring at this time. 

## 2017-10-15 NOTE — Progress Notes (Signed)
[facility] [place] MD Progress Note  10/15/2017 9:31 AM Laura Small  MRN:  [id number]  Subjective:    Laura Small feels better today. Denies any symptoms of depression. Pain is better. She worked with PT effectively. Complains that her insulin dose is too high. Takes only 28 Units of lantus at home.   Treatment plan. Continue Cymbalta for depression and Lodine and Robaxin for pain.  Social/disposition. Discharge tomorrow with the brother. Follow up with [facility].  Principal Problem: Severe major depression, single episode, without psychotic features (HCC) Diagnosis:   Patient Active Problem List   Diagnosis Date Noted  . Severe major depression, single episode, without psychotic features (HCC) [F32.2] 10/13/2017    Priority: High  . Depression, major, single episode, severe (HCC) [F32.2] 10/12/2017  . Muscle spasms of both lower extremities [M62.838] 10/12/2017  . Aortic atherosclerosis (HCC) [I70.0] 01/08/2017  . Tobacco abuse [Z72.0] 01/08/2017  . Ileitis [K52.9] 01/08/2017  . DM (diabetes mellitus), type 2 (HCC) [E11.9] 09/23/2016  . Hyperlipidemia [E78.5] 09/23/2016  . Chronic low back pain [M54.5, G89.29] 09/08/2016   Total Time spent with patient: 20 minutes  Past Psychiatric History: none.  Past Medical History:  Past Medical History:  Diagnosis Date  . Back pain   . Chicken pox   . Diabetes mellitus without complication (HCC)   . Migraines   . PONV (postoperative nausea and vomiting)    usually has 1 episode of vomiting within 1 hour of anesthesia     Past Surgical History:  Procedure Laterality Date  . BACK SURGERY    . CHOLECYSTECTOMY    . INSERTION OF MESH N/A 02/12/2015   Procedure: INSERTION OF MESH;  Surgeon: Natale Lay, MD;  Location: [facility] ORS;  Service: General;  Laterality: N/A;  . VENTRAL HERNIA REPAIR N/A 02/12/2015   Procedure: HERNIA REPAIR VENTRAL ADULT;  Surgeon: Natale Lay, MD;  Location: [facility] ORS;  Service: General;  Laterality: N/A;   Family History:  Family  History  Problem Relation Age of Onset  . Diabetes Mother   . Gallbladder disease Mother   . Heart disease Father   . Alcohol abuse Father   . Gallbladder disease Father   . Heart disease Paternal Grandmother   . Colon cancer Neg Hx    Family Psychiatric  History: none. Social History:  Social History   Substance and Sexual Activity  Alcohol Use No     Social History   Substance and Sexual Activity  Drug Use No    Social History   Socioeconomic History  . Marital status: Married    Spouse name: None  . Number of children: None  . Years of education: None  . Highest education level: None  Social Needs  . Financial resource strain: None  . Food insecurity - worry: None  . Food insecurity - inability: None  . Transportation needs - medical: None  . Transportation needs - non-medical: None  Occupational History  . None  Tobacco Use  . Smoking status: Former Smoker    Packs/day: 0.00    Types: Cigarettes  . Smokeless tobacco: Never Used  Substance and Sexual Activity  . Alcohol use: No  . Drug use: No  . Sexual activity: Not Currently    Partners: Male  Other Topics Concern  . None  Social History Narrative  . None   Additional Social History:                         Sleep:  Fair  Appetite:  Fair  Current Medications: Current Facility-Administered Medications  Medication Dose Route Frequency Provider Last Rate Last Dose  . acetaminophen (TYLENOL) tablet 650 mg  650 mg Oral Q6H PRN Clapacs, Jackquline DenmarkJohn T, MD   650 mg at 10/13/17 2147  . alum & mag hydroxide-simeth (MAALOX/MYLANTA) 200-200-20 MG/5ML suspension 30 mL  30 mL Oral Q4H PRN Clapacs, John T, MD      . aspirin EC tablet 81 mg  81 mg Oral Daily Clapacs, Jackquline DenmarkJohn T, MD   81 mg at 10/15/17 0810  . DULoxetine (CYMBALTA) DR capsule 60 mg  60 mg Oral Daily Lenise Jr B, MD   60 mg at 10/15/17 0811  . etodolac (LODINE) capsule 200 mg  200 mg Oral BID Arye Weyenberg B, MD   200 mg at 10/15/17  0631  . insulin aspart (novoLOG) injection 0-15 Units  0-15 Units Subcutaneous TID WC Clapacs, John T, MD      . insulin glargine (LANTUS) injection 40 Units  40 Units Subcutaneous Daily Clapacs, Jackquline DenmarkJohn T, MD   28 Units at 10/14/17 0809  . magnesium hydroxide (MILK OF MAGNESIA) suspension 30 mL  30 mL Oral Daily PRN Clapacs, John T, MD      . metFORMIN (GLUCOPHAGE) tablet 500 mg  500 mg Oral BID WC Clapacs, Jackquline DenmarkJohn T, MD   500 mg at 10/15/17 0811  . methocarbamol (ROBAXIN) tablet 500 mg  500 mg Oral TID Jiah Bari B, MD   500 mg at 10/15/17 0811  . traZODone (DESYREL) tablet 100 mg  100 mg Oral QHS PRN Clapacs, Jackquline DenmarkJohn T, MD   100 mg at 10/13/17 2147    Lab Results:  Results for orders placed or performed during the hospital encounter of 10/13/17 (from the past 48 hour(s))  Glucose, capillary     Status: Abnormal   Collection Time: 10/14/17  7:05 AM  Result Value Ref Range   Glucose-Capillary 117 (H) 65 - 99 mg/dL   Comment 1 Notify RN   Hemoglobin A1c     Status: Abnormal   Collection Time: 10/14/17  7:07 AM  Result Value Ref Range   Hgb A1c MFr Bld 6.5 (H) 4.8 - 5.6 %    Comment: (NOTE) Pre diabetes:          5.7%-6.4% Diabetes:              >6.4% Glycemic control for   <7.0% adults with diabetes    Mean Plasma Glucose 139.85 mg/dL    Comment: Performed at [facility], [address] [place], [place] [address]  Lipid panel     Status: Abnormal   Collection Time: 10/14/17  7:07 AM  Result Value Ref Range   Cholesterol 282 (H) 0 - 200 mg/dL   Triglycerides 604157 (H) <150 mg/dL   HDL 30 (L) >54>40 mg/dL   Total CHOL/HDL Ratio 9.4 RATIO   VLDL 31 0 - 40 mg/dL   LDL Cholesterol 098221 (H) 0 - 99 mg/dL    Comment:        Total Cholesterol/HDL:CHD Risk Coronary Heart Disease Risk Table                     Men   Women  1/2 Average Risk   3.4   3.3  Average Risk       5.0   4.4  2 X Average Risk   9.6   7.1  3 X Average Risk  23.4  11.0        Use the calculated Patient  Ratio above and the CHD Risk Table to determine the patient's CHD Risk.        ATP III CLASSIFICATION (LDL):  <100     mg/dL   Optimal  696-295  mg/dL   Near or Above                    Optimal  130-159  mg/dL   Borderline  284-132  mg/dL   High  >440     mg/dL   Very High Performed at [facility], [address] [place], [place] [address]   TSH     Status: None   Collection Time: 10/14/17  7:07 AM  Result Value Ref Range   TSH 4.317 0.350 - 4.500 uIU/mL    Comment: Performed by a 3rd Generation assay with a functional sensitivity of <=0.01 uIU/mL. Performed at [facility], [address] [place], [place] [address]   hCG, quantitative, pregnancy     Status: None   Collection Time: 10/14/17  7:07 AM  Result Value Ref Range   hCG, Beta Chain, Quant, S 2 <5 mIU/mL    Comment:          GEST. AGE      CONC.  (mIU/mL)   <=1 WEEK        5 - 50     2 WEEKS       50 - 500     3 WEEKS       100 - 10,000     4 WEEKS     1,000 - 30,000     5 WEEKS     3,500 - 115,000   6-8 WEEKS     12,000 - 270,000    12 WEEKS     15,000 - 220,000        FEMALE AND NON-PREGNANT FEMALE:     LESS THAN 5 mIU/mL Performed at [facility], [address] [place], [place] [address]   Glucose, capillary     Status: Abnormal   Collection Time: 10/15/17  7:17 AM  Result Value Ref Range   Glucose-Capillary 114 (H) 65 - 99 mg/dL   Comment 1 Notify RN     Blood Alcohol level:  Lab Results  Component Value Date   ETH <10 10/12/2017    Metabolic Disorder Labs: Lab Results  Component Value Date   HGBA1C 6.5 (H) 10/14/2017   MPG 139.85 10/14/2017   MPG 375 12/15/2016   No results found for: PROLACTIN Lab Results  Component Value Date   CHOL 282 (H) 10/14/2017   TRIG 157 (H) 10/14/2017   HDL 30 (L) 10/14/2017   CHOLHDL 9.4 10/14/2017   VLDL 31 10/14/2017   LDLCALC 221 (H) 10/14/2017    Physical Findings: AIMS:  , ,  ,  ,    CIWA:    COWS:      Musculoskeletal: Strength & Muscle Tone: within normal limits Gait & Station: unsteady Patient leans: N/A  Psychiatric Specialty Exam: Physical Exam  Nursing note and vitals reviewed. Psychiatric: She has a normal mood and affect. Her speech is normal and behavior is normal. Judgment and thought content normal. Cognition and memory are normal.    Review of Systems  Musculoskeletal: Positive for back pain.  Neurological: Negative.   Psychiatric/Behavioral: Negative.   All other systems reviewed and are negative.   Blood pressure 135/70, pulse 82, temperature  99.1 F (37.3 C), temperature source Oral, resp. rate 18, height 5\' 7"  (1.702 m), weight 104.3 kg (230 lb), SpO2 98 %.Body mass index is 36.02 kg/m.  General Appearance: Disheveled  Eye Contact:  Good  Speech:  Clear and Coherent  Volume:  Normal  Mood:  Euthymic  Affect:  Appropriate  Thought Process:  Goal Directed and Descriptions of Associations: Intact  Orientation:  Full (Time, Place, and Person)  Thought Content:  WDL  Suicidal Thoughts:  No  Homicidal Thoughts:  No  Memory:  Immediate;   Fair Recent;   Fair Remote;   Fair  Judgement:  Impaired  Insight:  Shallow  Psychomotor Activity:  Normal  Concentration:  Concentration: Fair and Attention Span: Fair  Recall:  [employer] of Knowledge:  Fair  Language:  Fair  Akathisia:  No  Handed:  Right  AIMS (if indicated):     Assets:  Communication Skills Desire for Improvement Housing Resilience Social Support  ADL's:  Intact  Cognition:  WNL  Sleep:  Number of Hours: 8     Treatment Plan Summary: Daily contact with patient to assess and evaluate symptoms and progress in treatment and Medication management   Laura Small is a 57 year old female with chronic pain and no past psychiatric history admitted for suicidal ideation.  #Suicidal ideation -patient able to contract for safety in the hospital  #Mood -increase Cymbalta to mg daily -Trazodone  100 mg nightly  #DM -Lantus 40 unitd daily, SSI, ADA diet, BS monitoring -Metformin 500 mg BID  #Chronic pain -Cymbalta -Lodine 200 mg BID -Robaxin -PT consult is appreciated -patient needs a walker  #Metabolic syndrome monitoring -Lipid panel, TSH and HgbA1C are pending -EKG, pending -pregnancy test  #Disposition -discharge to home with her brother -follow up with [facility]        Kristine Linea, MD 10/15/2017, 9:31 AM

## 2017-10-15 NOTE — Plan of Care (Signed)
Patient slept for Estimated Hours of 8; Precautionary checks every 15 minutes for safety maintained, room free of safety hazards, patient sustains no injury or falls during this shift.  

## 2017-10-15 NOTE — Progress Notes (Signed)
Physical Therapy Treatment Patient Details Name: Laura MochaShannon Small MRN: [id number] DOB: 12/05/1960 Today's Date: 10/15/2017    [facility] of Present Ill[facility] to major depression, suicidial ideation.  Stay complicated by reports of severe, chronic back pain.  Per chart, MRI from 11/2016 significant for L5-S1:  Facet hypertrophy. Complete loss of intervertebral disc space height and a posterior left eccentric disc as well as facet hypertrophy resulting in mild spinal canal narrowing and mild narrowing of the left greater than right subarticular recesses. Disc material may contact the descending left nerve root. There is mild left and no right foraminal narrowing.    PT Comments    Pt reports continued back pain but willing to participate.  Back extension exercises in sitting and standing with walker support x 10 each.  Pt was able to stand and ambulate 5' x 4 in room with heavy lean on walker.  Declined ambulation in hallway today.  Stated she has been walking on her own to and from bathroom and in room with walker without falls or LOB.  Pt self limiting gait during therapy session and declined further mobility at this time due to pain and fatigue.  Pt remained up in wheelchair.  After I left room, pt used walker to stand and walk to door, closed it and walked to bathroom leaving walker outside of the door and used sink for support.   Will continue as appropriate.   Follow Up Recommendations  Outpatient PT     Equipment Recommendations  Rolling walker with 5" wheels    Recommendations for Other Services       Precautions / Restrictions Precautions Precautions: Fall Restrictions Weight Bearing Restrictions: No    Mobility  Bed Mobility               General bed mobility comments: in wheelchair  Transfers Overall transfer level: Needs assistance Equipment used: Rolling walker (2 wheeled) Transfers: Sit to/from Stand Sit to Stand: Min guard          General transfer comment: cuing for hand placement  Ambulation/Gait Ambulation/Gait assistance: Min guard Ambulation Distance (Feet): 5 Feet Assistive device: Rolling walker (2 wheeled) Gait Pattern/deviations: Step-to pattern   Gait velocity interpretation: <1.8 ft/sec, indicative of risk for recurrent falls General Gait Details: 5' x 4    Stairs            Wheelchair Mobility    Modified Rankin (Stroke Patients Only)       Balance Overall balance assessment: Needs assistance Sitting-balance support: No upper extremity supported;Feet supported Sitting balance-Leahy Scale: Good     Standing balance support: Bilateral upper extremity supported Standing balance-Leahy Scale: Fair                              Cognition Arousal/Alertness: Awake/alert Behavior During Therapy: WFL for tasks assessed/performed Overall Cognitive Status: Within Functional Limits for tasks assessed                                        Exercises Other Exercises Other Exercises: back extension exercises in sitting and standing x 10    General Comments        Pertinent Vitals/Pain Pain Assessment: 0-10 Pain Score: 4  Pain Location: 4/10 increased to 8/10 after activity Pain Descriptors / Indicators: Aching Pain Intervention(s): Limited activity within patient's tolerance  Home Living                      Prior Function            PT Goals (current goals can now be found in the care plan section) Progress towards PT goals: Progressing toward goals    Frequency    Min 2X/week      PT Plan Current plan remains appropriate    Co-evaluation              AM-PAC PT "6 Clicks" Daily Activity  Outcome Measure  Difficulty turning over in bed (including adjusting bedclothes, sheets and blankets)?: None Difficulty moving from lying on back to sitting on the side of the bed? : None Difficulty sitting down on and standing up from  a chair with arms (e.g., wheelchair, bedside commode, etc,.)?: A Little Help needed moving to and from a bed to chair (including a wheelchair)?: A Little Help needed walking in hospital room?: A Little Help needed climbing 3-5 steps with a railing? : A Little 6 Click Score: 20    End of Session Equipment Utilized During Treatment: Gait belt Activity Tolerance: Patient tolerated treatment well Patient left: in chair         Time: 4098-1191 PT Time Calculation (min) (ACUTE ONLY): 13 min  Charges:  $Gait Training: 8-22 mins                    G Codes:       Danielle Dess, PTA 10/15/17, 12:07 PM

## 2017-10-15 NOTE — Progress Notes (Signed)
D- Patient alert and oriented. Patient presents in a pleasant mood on assessment stating that she feels good other than "aches and pains, I think I'm coming down with the flu". Patient denies SI, HI, AVH, and pain at this time. Patient states "at first I had the car and no keys and now I have the keys and the car". Patient states that "I have the resources now to help me". Patient also denies any signs/symptoms of depression and anxiety at this time. Patient's goal for today is "to work towards my release", which she will accomplish by "whatever is necessary".  A- Scheduled medications administered to patient, per MD orders. Patient refused her morning insulin stating "I only take twenty-eight units of long-acting and no meal coverage", patient states that "somebody is trying to hurt me". Support and encouragement provided.  Routine safety checks conducted every 15 minutes.  Patient informed to notify staff with problems or concerns.  R- No adverse drug reactions noted. Patient contracts for safety at this time. Patient compliant with medications and treatment plan. Patient receptive, calm, and cooperative. Patient interacts well with others on the unit.  Patient remains safe at this time.

## 2017-10-15 NOTE — Progress Notes (Signed)
Patient ID: Laura Small, female   DOB: 04/08/1961, 57 y.o.   MRN: [id number] Much better demeanor today, satisfied about her pain management, played a Lead Role during the evening group, still using WC to navigate the unit; pleasant, polite, denied SI/HI/AVH; nursing staff will continue to provide clinical and moral support.

## 2017-10-15 NOTE — Progress Notes (Signed)
Recreation Therapy Notes   Date: 01.24.2019  Time: 1:00 PM  Location: Craft Room  Behavioral response: Appropriate   Intervention Topic: Problem Solving  Discussion/Intervention: Group content on today was focused on problem solving. The group described what problem solving is. Patients expressed how problems affect them and how they deal with problems. Individuals identified healthy ways to deal with problems. Patients explained what normally happens to them when they do not deal with problems. The group expressed reoccurring problems for them. The group participated in the intervention "Ways to Solve problems" where patients were given a chance to explore different ways to solve problems.  Clinical Observations/Feedback:  Patient came to group and stated problem solving is coming up with solutions. She stated that the way she solves a problem depends on what the problem is. Individual participated in the intervention and was social with peers and staff during group.  Normand Damron LRT/CTRS          Mackinley Kiehn 10/15/2017 2:07 PM

## 2017-10-15 NOTE — [facility] Group Notes (Signed)
LCSW Group Therapy Note 10/15/2017 9:00 AM  Type of Therapy and Topic:  Group Therapy:  Setting Goals  Participation Level:  Active  Description of Group: In this process group, patients discussed using strengths to work toward goals and address challenges.  Patients identified two positive things about themselves and one goal they were working on.  Patients were given the opportunity to share openly and support each other's plan for self-empowerment.  The group discussed the value of gratitude and were encouraged to have a daily reflection of positive characteristics or circumstances.  Patients were encouraged to identify a plan to utilize their strengths to work on current challenges and goals.  Therapeutic Goals 1. Patient will verbalize personal strengths/positive qualities and relate how these can assist with achieving desired personal goals 2. Patients will verbalize affirmation of peers plans for personal change and goal setting 3. Patients will explore the value of gratitude and positive focus as related to successful achievement of goals 4. Patients will verbalize a plan for regular reinforcement of personal positive qualities and circumstances.  Summary of Patient Progress:  Carollee HerterShannon has active participation in goals group.  She stated that she had a good understanding on how she can develop her own SMART goals.  Carollee HerterShannon identified her goal for today as "discuss getting discharged with my doctor and move on with the resources that have been given to me".      Therapeutic Modalities Cognitive Behavioral Therapy Motivational Interviewing    Alease FrameSonya S Ebon Ketchum, Alexander MtLCSW 10/15/2017 12:37 PM

## 2017-10-15 NOTE — Progress Notes (Signed)
Patient refused CBG stating "y'all only get that one time" and patient also refused afternoon insulin stating "I only get twenty-eight units of long-acting not forty, somebody is trying to hurt me".

## 2017-10-15 NOTE — [facility] Group Notes (Signed)
10/15/2017  Time: 0930  Type of Therapy/Topic:  Group Therapy:  Balance in Life  Participation Level:  Did Not Attend  Description of Group:   This group will address the concept of balance and how it feels and looks when one is unbalanced. Patients will be encouraged to process areas in their lives that are out of balance and identify reasons for remaining unbalanced. Facilitators will guide patients in utilizing problem-solving interventions to address and correct the stressor making their life unbalanced. Understanding and applying boundaries will be explored and addressed for obtaining and maintaining a balanced life. Patients will be encouraged to explore ways to assertively make their unbalanced needs known to significant others in their lives, using other group members and facilitator for support and feedback.  Therapeutic Goals: 1. Patient will identify two or more emotions or situations they have that consume much of in their lives. 2. Patient will identify signs/triggers that life has become out of balance:  3. Patient will identify two ways to set boundaries in order to achieve balance in their lives:  4. Patient will demonstrate ability to communicate their needs through discussion and/or role plays  Summary of Patient Progress: Pt was invited to attend group but chose not to attend. CSW will continue to encourage pt to attend group throughout their admission.    Therapeutic Modalities:   Cognitive Behavioral Therapy Solution-Focused Therapy Assertiveness Training   Abanoub Hanken, MSW, LCSW 10/15/2017 10:41 AM  

## 2017-10-15 NOTE — Plan of Care (Signed)
Patient is functioning at an adequate level and reports that she slept well last night. Patient states that she is feeling good overall by stating "at first I felt like I had the car and no keys, but now I have the keys and the car". Patient denies SI/HI/AVH at this time. Patient also denies depression and anxiety at this time. Patient states that "I have the resources now to help me". Patient verbalizes understanding of her therapeutic/medication regimen and has been in compliance thus far. Patient has the ability to make decisions. Patient has been participating in unit groups and has not expressed having any issues thus far. Patient is safe on the unit at this time.

## 2017-10-16 ENCOUNTER — Telehealth: Payer: Self-pay | Admitting: Pharmacist

## 2017-10-16 LAB — GLUCOSE, CAPILLARY
GLUCOSE-CAPILLARY: 101 mg/dL — AB (ref 65–99)
Glucose-Capillary: 135 mg/dL — ABNORMAL HIGH (ref 65–99)

## 2017-10-16 NOTE — Progress Notes (Signed)
Patient was visible in the milieu until bed time. She has been displaying non compliance behaviors such as refusing her CBG check and insulin. Patient continues to endorse depression: looks sad and expressing hopelessness. Currently denying SI. Was encouraged to talk to staff as needed. Currently sleeping and staff continue to monitor.

## 2017-10-16 NOTE — [facility] Suicide Risk Assessment (Addendum)
[facility]   Principal Problem: Severe major depression, single episode, without psychotic features [facility](HCC) Discharge Diagnoses:  Patient Active Problem List   Diagnosis Date Noted  . Severe major depression, single episode, without psychotic features (HCC) [F32.2] 10/13/2017    Priority: High  . Depression, major, single episode, severe (HCC) [F32.2] 10/12/2017  . Muscle spasms of both lower extremities [M62.838] 10/12/2017  . Aortic atherosclerosis (HCC) [I70.0] 01/08/2017  . Tobacco abuse [Z72.0] 01/08/2017  . Ileitis [K52.9] 01/08/2017  . DM (diabetes mellitus), type 2 (HCC) [E11.9] 09/23/2016  . Hyperlipidemia [E78.5] 09/23/2016  . Chronic low back pain [M54.5, G89.29] 09/08/2016    Total Time spent with patient: 30 minutes  Musculoskeletal: Strength & Muscle Tone: within normal limits Gait & Station: normal Patient leans: N/A  Psychiatric Specialty Exam: Review of Systems  Neurological: Negative.   Psychiatric/Behavioral: Negative.   All other systems reviewed and are negative.   Blood pressure 135/70, pulse 82, temperature 99.1 F (37.3 C), temperature source Oral, resp. rate 18, height 5\' 7"  (1.702 m), weight 104.3 kg (230 lb), SpO2 98 %.Body mass index is 36.02 kg/m.  General Appearance: Casual  Eye Contact::  Good  Speech:  Clear and Coherent409  Volume:  Normal  Mood:  Euthymic  Affect:  Appropriate  Thought Process:  Goal Directed and Descriptions of Associations: Intact  Orientation:  Full (Time, Place, and Person)  Thought Content:  WDL  Suicidal Thoughts:  No  Homicidal Thoughts:  No  Memory:  Immediate;   Fair Recent;   Fair Remote;   Fair  Judgement:  Impaired  Insight:  Shallow  Psychomotor Activity:  Normal  Concentration:  Fair  Recall:  Fis[employer]Fair  Fund of Knowledge:Fair  Language: Fair  Akathisia:  No  Handed:  Right  AIMS (if indicated):     Assets:  Communication Skills Desire for  Improvement Housing Resilience Social Support  Sleep:  Number of Hours: 7.15  Cognition: WNL  ADL's:  Intact   Mental Status Per Nursing Assessment::   On Admission:     Demographic Factors:  Unemployed  Loss Factors: Decline in physical health and Financial problems/change in socioeconomic status  Historical Factors: Impulsivity  Risk Reduction Factors:   Sense of responsibility to family, Living with another person, especially a relative and Positive social support  Continued Clinical Symptoms:  Depression:   Impulsivity Chronic Pain  Cognitive Features That Contribute To Risk:  None    Suicide Risk:  Minimal: No identifiable suicidal ideation.  Patients presenting with no risk factors but with morbid ruminations; may be classified as minimal risk based on the severity of the depressive symptoms  Follow-up Information    [facility] Follow up on 10/21/2017.   Why:  You will meet with Unk PintoHarvey Bryant, Peer Support Specialist, [facility] at 7:15AM. Mr. Beverely PaceBryant can be contacted at [phone] if you have any questions or need to re-schedule this appointment. Contact information: [address] [place] [address] [phone]           Plan Of Care/Follow-up recommendations:  Activity:  as tolerated Diet:  low sodium heart healthy ADA diet Other:  keep follow up appointments  Kristine LineaJolanta Abdelrahman Nair, MD 10/16/2017, 6:18 AM

## 2017-10-16 NOTE — Progress Notes (Signed)
Patient woke up this morning and stayed in the milieu, waiting for CBG to be checked. Was upset reporting that she is not receiving the right dose of her insulin "that is why I need to get out of here".   Was educated about her medication regime and became more cooperative.

## 2017-10-16 NOTE — Progress Notes (Signed)
  [facility]  Will you be returning to the same living situation after discharge:  Yes,  Pt will be returning back to her home. At discharge, do you have transportation home?: Yes,  Pt's brother will be transporting her back to her home at discharge. Do you have the ability to pay for your medications: Yes,  Pt receives financial assistance for her medications.  Release of information consent forms completed and in the chart;  Patient's signature needed at discharge.  Patient to Follow up at: Follow-up Information    [facility] Follow up on 10/21/2017.   Why:  You will meet with Unk PintoHarvey Bryant, Peer Support Specialist, at [facility] at 7:15AM. Mr. Beverely PaceBryant can be contacted at [phone] if you have any questions or need to re-schedule this appointment. Contact information: [address] [place] [address] [phone]           Next level of care provider [facility] Link:yes  Safety Planning and Suicide Prevention discussed: Yes,  No safety concerns noted.  Have you used any form of tobacco in the last 30 days? (Cigarettes, Smokeless Tobacco, Cigars, and/or Pipes): Yes  Has patient been referred to the Quitline?: N/A patient is not a smoker  Patient has been referred for addiction treatment: N/A  Alease FrameSonya S Anelle Parlow, LCSW 10/16/2017, 9:30 AM

## 2017-10-16 NOTE — Progress Notes (Signed)
Recreation Therapy Notes  INPATIENT RECREATION TR PLAN  Patient Details Name: Laura Small MRN: [id number] DOB: 1960/10/06 Today's Date: 10/16/2017  Rec Therapy Plan Is patient appropriate for Therapeutic Recreation?: Yes Treatment times per week: at least 3 Estimated Length of Stay: 5-7 days TR Treatment/Interventions: Group participation (Comment)  Discharge Criteria Pt will be discharged from therapy if:: Discharged Treatment plan/goals/alternatives discussed and agreed upon by:: Patient/family  Discharge Summary Short term goals set: Patient will identify 3 new healthy leisure opportunities to participate in post discharge x5 days.  Short term goals met: Adequate for discharge Progress toward goals comments: Groups attended Which groups?: Other (Comment)(Problem Solving, Team work) Reason goals not met: N/A Therapeutic equipment acquired: N/A Reason patient discharged from therapy: Discharge from hospital Pt/family agrees with progress & goals achieved: Yes Date patient discharged from therapy: 10/16/17   Dillie Burandt 10/16/2017, 3:33 PM

## 2017-10-16 NOTE — [facility] Suicide Risk Assessment (Signed)
[facility] INPATIENT:  Family/Significant Other Suicide Prevention Education  Suicide Prevention Education:  Contact Attempts: Laura HartsDenne Small (pt's brother at [phone]) has been identified by the patient as the family member/significant other with whom the patient will be residing, and identified as the person(s) who will aid the patient in the event of a mental health crisis.  With written consent from the patient, two attempts were made to provide suicide prevention education, prior to and/or following the patient's discharge.  We were unsuccessful in providing suicide prevention education.  A suicide education pamphlet was given to the patient to share with family/significant other.  Date and time of first attempt:10/15/17/3:34 PM Date and time of second attempt: 10/16/17/9:28 AM  Laura FrameSonya S Kamica Florance, LCSW 10/16/2017, 1:48 PM

## 2017-10-16 NOTE — Telephone Encounter (Signed)
10/16/17 Printed [employer] refill request for The PepsiBasaglar Kwikpen Inject 40-46 units under the skin every morning # 4. Max daily dose 46 units. Will take to Dr. Birdie SonsSonnenberg to sign for refill.Forde RadonAJ

## 2017-10-16 NOTE — Progress Notes (Signed)
Patient ID: Laura Small, female   DOB: 06/19/1961, 57 y.o.   MRN: [id number]  Discharge Note:  Patient denies SI/HI/AVH at this time. Discharge instructions, AVS, transition record, and prescriptions gone over with patient. Patient belongings returned to patient. Patient agrees to comply with medication management, follow-up visit, and outpatient therapy. Patient questions and concerns addressed and answered. Patient escorted off unit via wheelchair. Patient discharged to home with brother.

## 2017-10-16 NOTE — Discharge Summary (Signed)
Physician Discharge Summary Note  Patient:  Laura Small is an 57 y.o., female MRN:  [id number] DOB:  04/27/1961 Patient phone:  [phone] (home)  Patient address:   [address] [place] [address],  Total Time spent with patient: 30 minutes  Date of Admission:  10/13/2017 Date of Discharge: 10/16/2017  Reason for Admission:   Suicidal ideation  History of Present Illness:   Identifying data. Laura Small is a 57 year old female with no past psychiatric history.  Chief complaint. "I have nothing to live for."  History of present illness. Information was obtained from the patient and the chart. The patient was send to the ER from her doctor's office after she disclosed suicidal ideation. She did not have a clear plan. The patient reports many stressors including chronic pain that she is unable to address due to bad financial situation and lack of health insurance, difficult housing situation, family conflict and separation from her husband. She became increasingly depressed with poor sleep, decreased appetite, anhedonia, feeling of hopelessness helplessness and guilt, poor energy and concentration, anhedonia, crying spells, social isolation and now suicidal thinking. She denies psychotic symptoms or symptoms suggestive of bipolar mania. There is no alcohol or substances involved.  Past psychiatric history. She denies prior hospitalizations or treatments. No suicide attempts.  Family psychiatric history. None reported.  Social history. The patient has a history of remote back surgery and believes that in November of 2017 her back "snapped" and she started experiencing excruciating pain affecting her muscle strenght and ability to walk. She did have MRI at [facility] but was told that no surgery was indicated. She received multiple courses of steroids that led to diabetes. She considers herself disabled. After her husband left her, she moved in with her brother.   Principal  Problem: Severe major depression, single episode, without psychotic features [facility]) Discharge Diagnoses: Patient Active Problem List   Diagnosis Date Noted  . Severe major depression, single episode, without psychotic features (HCC) [F32.2] 10/13/2017    Priority: High  . Depression, major, single episode, severe (HCC) [F32.2] 10/12/2017  . Muscle spasms of both lower extremities [M62.838] 10/12/2017  . Aortic atherosclerosis (HCC) [I70.0] 01/08/2017  . Tobacco abuse [Z72.0] 01/08/2017  . Ileitis [K52.9] 01/08/2017  . DM (diabetes mellitus), type 2 (HCC) [E11.9] 09/23/2016  . Hyperlipidemia [E78.5] 09/23/2016  . Chronic low back pain [M54.5, G89.29] 09/08/2016   Past Medical History:  Past Medical History:  Diagnosis Date  . Back pain   . Chicken pox   . Diabetes mellitus without complication (HCC)   . Migraines   . PONV (postoperative nausea and vomiting)    usually has 1 episode of vomiting within 1 hour of anesthesia     Past Surgical History:  Procedure Laterality Date  . BACK SURGERY    . CHOLECYSTECTOMY    . INSERTION OF MESH N/A 02/12/2015   Procedure: INSERTION OF MESH;  Surgeon: Natale Lay, MD;  Location: [facility] ORS;  Service: General;  Laterality: N/A;  . VENTRAL HERNIA REPAIR N/A 02/12/2015   Procedure: HERNIA REPAIR VENTRAL ADULT;  Surgeon: Natale Lay, MD;  Location: [facility] ORS;  Service: General;  Laterality: N/A;   Family History:  Family History  Problem Relation Age of Onset  . Diabetes Mother   . Gallbladder disease Mother   . Heart disease Father   . Alcohol abuse Father   . Gallbladder disease Father   . Heart disease Paternal Grandmother   . Colon cancer Neg Hx  Social History:  Social History   Substance and Sexual Activity  Alcohol Use No     Social History   Substance and Sexual Activity  Drug Use No    Social History   Socioeconomic History  . Marital status: Married    Spouse name: None  . Number of children: None  . Years of education:  None  . Highest education level: None  Social Needs  . Financial resource strain: None  . Food insecurity - worry: None  . Food insecurity - inability: None  . Transportation needs - medical: None  . Transportation needs - non-medical: None  Occupational History  . None  Tobacco Use  . Smoking status: Former Smoker    Packs/day: 0.00    Types: Cigarettes  . Smokeless tobacco: Never Used  Substance and Sexual Activity  . Alcohol use: No  . Drug use: No  . Sexual activity: Not Currently    Partners: Male  Other Topics Concern  . None  Social History Narrative  . None    Hospital Course:    Laura. Alberteen Small is a 57 year old female with chronic pain and no past psychiatric history admitted for suicidal ideation. Suicidal ideation has resolved and tha patient is able to contract for safety. She is forward thinking and optimistic about the future.  #Mood -continue Cymbalta 60 mg daily  -continue Trazodone 100 mg nightly  #DM -Lantus 40 unitd daily, SSI, ADA diet, BS were stable -patient insists she takes 28 units at home -Metformin 500 mg BID  #Chronic pain -Cymbalta -Lodine 200 mg BID -Robaxin 500 mg TID -PT consult is appreciated -patient needs a walker  #Disposition -discharge to home with her brother -follow up with [facility]    Physical Findings: AIMS:  , ,  ,  ,    CIWA:    COWS:     Musculoskeletal: Strength & Muscle Tone: within normal limits and decreased Gait & Station: unsteady Patient leans: N/A  Psychiatric Specialty Exam: Physical Exam  Nursing note and vitals reviewed. Psychiatric: She has a normal mood and affect. Her speech is normal and behavior is normal. Judgment and thought content normal. Cognition and memory are normal.    Review of Systems  Musculoskeletal: Positive for back pain.  Neurological: Negative.   Psychiatric/Behavioral: Negative.   All other systems reviewed and are negative.   Blood pressure 135/70, pulse 82, temperature  99.1 F (37.3 C), temperature source Oral, resp. rate 18, height 5\' 7"  (1.702 m), weight 104.3 kg (230 lb), SpO2 98 %.Body mass index is 36.02 kg/m.  General Appearance: Casual  Eye Contact:  Good  Speech:  Clear and Coherent  Volume:  Normal  Mood:  Euthymic  Affect:  Appropriate  Thought Process:  Goal Directed  Orientation:  Full (Time, Place, and Person)  Thought Content:  WDL  Suicidal Thoughts:  No  Homicidal Thoughts:  No  Memory:  Immediate;   Fair Recent;   Fair Remote;   Fair  Judgement:  Impaired  Insight:  Shallow  Psychomotor Activity:  Normal  Concentration:  Concentration: Fair and Attention Span: Fair  Recall:  [employer]Fair  Fund of Knowledge:  Fair  Language:  Fair  Akathisia:  No  Handed:  Right  AIMS (if indicated):     Assets:  Communication Skills Desire for Improvement Housing Resilience Social Support  ADL's:  Intact  Cognition:  WNL  Sleep:  Number of Hours: 7.15     Have you used any form of tobacco  in the last 30 days? (Cigarettes, Smokeless Tobacco, Cigars, and/or Pipes): Yes  Has this patient used any form of tobacco in the last 30 days? (Cigarettes, Smokeless Tobacco, Cigars, and/or Pipes) Yes, No  Blood Alcohol level:  Lab Results  Component Value Date   ETH <10 10/12/2017    Metabolic Disorder Labs:  Lab Results  Component Value Date   HGBA1C 6.5 (H) 10/14/2017   MPG 139.85 10/14/2017   MPG 375 12/15/2016   No results found for: PROLACTIN Lab Results  Component Value Date   CHOL 282 (H) 10/14/2017   TRIG 157 (H) 10/14/2017   HDL 30 (L) 10/14/2017   CHOLHDL 9.4 10/14/2017   VLDL 31 10/14/2017   LDLCALC 221 (H) 10/14/2017    See Psychiatric Specialty Exam and Suicide Risk Assessment completed by Attending Physician prior to discharge.  Discharge destination:  Home  Is patient on multiple antipsychotic therapies at discharge:  No   Has Patient had three or more failed trials of antipsychotic monotherapy by history:   No  Recommended Plan for Multiple Antipsychotic Therapies: NA  Discharge Instructions    Diet - low sodium heart healthy   Complete by:  As directed    Increase activity slowly   Complete by:  As directed      Allergies as of 10/16/2017      Reactions   Bee Venom Anaphylaxis, Swelling   Advil [ibuprofen] Swelling   Jardiance [empagliflozin] Rash   Nitroglycerin Other (See Comments)   Pt states "causes me to feel off and weird all over and pressure in hands"   Prednisone Swelling      Medication List    TAKE these medications     Indication  aspirin EC 81 MG tablet Take 1 tablet (81 mg total) by mouth daily.  Indication:  Inflammation   BASAGLAR KWIKPEN 100 UNIT/ML Sopn Inject 0.4 mLs (40 Units total) into the skin every morning. Increase per instructions.  Indication:  Type 2 Diabetes   DULoxetine 60 MG capsule Commonly known as:  CYMBALTA Take 1 capsule (60 mg total) by mouth daily.  Indication:  Major Depressive Disorder   etodolac 200 MG capsule Commonly known as:  LODINE Take 1 capsule (200 mg total) by mouth 2 (two) times daily.  Indication:  Joint Damage causing Pain and Loss of Function   Insulin Pen Needle 32G X 6 MM Misc Use as directed for once daily insulin. E11.9  Indication:  insulin tx   metFORMIN 500 MG 24 hr tablet Commonly known as:  GLUCOPHAGE XR Take 1 tablet (500 mg total) by mouth 2 (two) times daily.  Indication:  Type 2 Diabetes   methocarbamol 500 MG tablet Commonly known as:  ROBAXIN Take 1 tablet (500 mg total) by mouth 3 (three) times daily.  Indication:  Musculoskeletal Pain   traZODone 100 MG tablet Commonly known as:  DESYREL Take 1 tablet (100 mg total) by mouth at bedtime as needed for sleep.  Indication:  Corporate investment banker  (From admission, onward)        Start     Ordered   10/15/17 1615  For home use only DME Walker  Once    Question:  Patient needs a walker to treat with  the following condition  Answer:  Falls   10/15/17 1614     Follow-up Information    [facility] Follow up on 10/21/2017.  Why:  You will meet with Unk Pinto, Peer Support Specialist, at the [facility] office at 7:15AM. Mr. Beverely Pace can be contacted at [phone] if you have any questions or need to re-schedule this appointment. Contact information: [address] [place] [address] [phone]           Follow-up recommendations:  Activity:  as tolerated Diet:  low sodium heart healthy ADA diet Other:  keep follow up appointment  Comments:    Signed: Kristine Linea, MD 10/16/2017, 6:23 AM

## 2017-10-16 NOTE — Plan of Care (Signed)
Patient has been refusing CBG check.  

## 2017-10-16 NOTE — [facility] Group Notes (Signed)
10/16/2017 9:30AM  Type of Therapy and Topic:  Group Therapy:  Feelings around Relapse and Recovery  Participation Level:  Active   Description of Group:    Patients in this group will discuss emotions they experience before and after a relapse. They will process how experiencing these feelings, or avoidance of experiencing them, relates to having a relapse. Facilitator will guide patients to explore emotions they have related to recovery. Patients will be encouraged to process which emotions are more powerful. They will be guided to discuss the emotional reaction significant others in their lives may have to patients' relapse or recovery. Patients will be assisted in exploring ways to respond to the emotions of others without this contributing to a relapse.  Therapeutic Goals: 1. Patient will identify two or more emotions that lead to a relapse for them 2. Patient will identify two emotions that result when they relapse 3. Patient will identify two emotions related to recovery 4. Patient will demonstrate ability to communicate their needs through discussion and/or role plays   Summary of Patient Progress: Actively and appropriately engaged in the group. Patient was able to provide support and validation to other group members.Patient practiced active listening when interacting with the facilitator and other group members Patient in still in the process of obtaining treatment goals. Carollee HerterShannon says "I was frustrated before I relapsed because I cant do the things that I use to do for myself due to medical problems. I have been working on the "Stop sign" skill I learned earlier this week and it has been helping me a lot with my frustration."    Therapeutic Modalities:   Cognitive Behavioral Therapy Solution-Focused Therapy Assertiveness Training Relapse Prevention Therapy   Johny ShearsCassandra  Paislyn Domenico, LCSW 10/16/2017 10:30 AM

## 2017-10-16 NOTE — [facility] Group Notes (Signed)
[facility] Group Notes:  (Nursing/MHT/Case Management/Adjunct)  Date:  10/16/2017  Time:  7:03 AM  Type of Therapy:  Psychoeducational Skills  Participation Level:  Active  Participation Quality:  Appropriate and Attentive  Affect:  Appropriate  Cognitive:  Appropriate  Insight:  Appropriate  Engagement in Group:  Engaged  Modes of Intervention:  Discussion, Socialization and Support  Summary of Progress/Problems:  Chancy MilroyLaquanda Y Dore Oquin 10/16/2017, 7:03 AM

## 2017-10-22 ENCOUNTER — Telehealth: Payer: Self-pay | Admitting: Family Medicine

## 2017-10-22 NOTE — Telephone Encounter (Signed)
[facility] dropped off form to be filled out.. Placed in Dr. Birdie SonsSonnenberg color folder upfront..  Please call Mrs. Laural BenesJohnson @ [phone] when completed

## 2017-10-22 NOTE — Telephone Encounter (Signed)
Placed in red folder  

## 2017-10-24 NOTE — Telephone Encounter (Signed)
Completed.

## 2017-10-26 ENCOUNTER — Telehealth: Payer: Self-pay | Admitting: Pharmacist

## 2017-10-26 NOTE — Telephone Encounter (Signed)
Laura Small called medication management and they have been informed that they re ready to be picked up at front desk

## 2017-10-26 NOTE — Telephone Encounter (Signed)
Called [facility]) to inform them that Ms. Gitlin's application materials for [employer] have been completed and placed up front.   Allena Katzaroline E Amika Tassin, Pharm.D., BCPS PGY2 Ambulatory Care Pharmacy Resident Phone: [phone]

## 2017-10-27 ENCOUNTER — Telehealth: Payer: Self-pay | Admitting: Adult Health Nurse Practitioner

## 2017-10-27 NOTE — Telephone Encounter (Signed)
Calling on behalf of her husband who needs an eye exam

## 2017-10-30 ENCOUNTER — Telehealth: Payer: Self-pay | Admitting: Pharmacist

## 2017-10-30 NOTE — Telephone Encounter (Signed)
10/30/17 Faxed refill request to [employer] for [employer] Inject 40-46 units under the skin every morning Max daily dose 46 units.Forde RadonAJ

## 2017-11-09 ENCOUNTER — Emergency Department
Admission: EM | Admit: 2017-11-09 | Discharge: 2017-11-09 | Disposition: A | Discharge status: Home / Self Care | Payer: No Typology Code available for payment source | Attending: Emergency Medicine | Admitting: Emergency Medicine

## 2017-11-09 ENCOUNTER — Other Ambulatory Visit: Payer: Self-pay

## 2017-11-09 ENCOUNTER — Emergency Department: Payer: No Typology Code available for payment source

## 2017-11-09 DIAGNOSIS — Z7982 Long term (current) use of aspirin: Secondary | ICD-10-CM | POA: Insufficient documentation

## 2017-11-09 DIAGNOSIS — Y92410 Unspecified street and highway as the place of occurrence of the external cause: Secondary | ICD-10-CM | POA: Diagnosis not present

## 2017-11-09 DIAGNOSIS — M5442 Lumbago with sciatica, left side: Secondary | ICD-10-CM

## 2017-11-09 DIAGNOSIS — Y9389 Activity, other specified: Secondary | ICD-10-CM | POA: Insufficient documentation

## 2017-11-09 DIAGNOSIS — Z794 Long term (current) use of insulin: Secondary | ICD-10-CM | POA: Insufficient documentation

## 2017-11-09 DIAGNOSIS — G8929 Other chronic pain: Secondary | ICD-10-CM | POA: Insufficient documentation

## 2017-11-09 DIAGNOSIS — Z87891 Personal history of nicotine dependence: Secondary | ICD-10-CM | POA: Diagnosis not present

## 2017-11-09 DIAGNOSIS — Y999 Unspecified external cause status: Secondary | ICD-10-CM | POA: Insufficient documentation

## 2017-11-09 DIAGNOSIS — M5431 Sciatica, right side: Secondary | ICD-10-CM | POA: Diagnosis not present

## 2017-11-09 DIAGNOSIS — M5441 Lumbago with sciatica, right side: Secondary | ICD-10-CM

## 2017-11-09 DIAGNOSIS — M5432 Sciatica, left side: Secondary | ICD-10-CM | POA: Diagnosis not present

## 2017-11-09 DIAGNOSIS — E119 Type 2 diabetes mellitus without complications: Secondary | ICD-10-CM | POA: Diagnosis not present

## 2017-11-09 DIAGNOSIS — Z79899 Other long term (current) drug therapy: Secondary | ICD-10-CM | POA: Insufficient documentation

## 2017-11-09 DIAGNOSIS — S39012A Strain of muscle, fascia and tendon of lower back, initial encounter: Secondary | ICD-10-CM | POA: Diagnosis not present

## 2017-11-09 DIAGNOSIS — S3982XA Other specified injuries of lower back, initial encounter: Secondary | ICD-10-CM | POA: Diagnosis present

## 2017-11-09 MED ORDER — LIDOCAINE 5 % EX PTCH: 1.0000 | MEDICATED_PATCH | Freq: Two times a day (BID) | CUTANEOUS | 0 refills | Status: DC

## 2017-11-09 MED ORDER — OXYCODONE-ACETAMINOPHEN 5-325 MG PO TABS: 2.0000 | ORAL_TABLET | ORAL | 0 refills | Status: DC | PRN

## 2017-11-09 MED ORDER — MORPHINE SULFATE (PF) 4 MG/ML IV SOLN
4.0000 mg | Freq: Once | INTRAVENOUS | Status: AC
  Administered 2017-11-09: 4 mg via INTRAVENOUS
  Filled 2017-11-09: qty 1

## 2017-11-09 MED ORDER — LIDOCAINE 5 % EX PTCH
1.0000 | MEDICATED_PATCH | CUTANEOUS | Status: DC
  Filled 2017-11-09: qty 1

## 2017-11-09 MED ORDER — ONDANSETRON HCL 4 MG/2ML IJ SOLN
4.0000 mg | Freq: Once | INTRAMUSCULAR | Status: AC
  Administered 2017-11-09: 4 mg via INTRAVENOUS
  Filled 2017-11-09: qty 2

## 2017-11-09 MED ORDER — DIAZEPAM 2 MG PO TABS: 2.0000 mg | ORAL_TABLET | Freq: Four times a day (QID) | ORAL | 0 refills | Status: DC | PRN
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
## 2017-11-09 NOTE — Discharge Instructions (Signed)
Please follow up with your primary care physician for further evaluation of your pain °
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
## 2017-11-11 ENCOUNTER — Ambulatory Visit: Payer: Self-pay | Admitting: Family Medicine

## 2017-11-11 ENCOUNTER — Encounter: Payer: Self-pay | Admitting: Family Medicine

## 2017-11-11 ENCOUNTER — Ambulatory Visit (INDEPENDENT_AMBULATORY_CARE_PROVIDER_SITE_OTHER): Payer: Self-pay | Admitting: Family Medicine

## 2017-11-11 DIAGNOSIS — G8929 Other chronic pain: Secondary | ICD-10-CM

## 2017-11-11 DIAGNOSIS — F322 Major depressive disorder, single episode, severe without psychotic features: Secondary | ICD-10-CM

## 2017-11-11 DIAGNOSIS — Z794 Long term (current) use of insulin: Secondary | ICD-10-CM

## 2017-11-11 DIAGNOSIS — M545 Low back pain: Secondary | ICD-10-CM

## 2017-11-11 DIAGNOSIS — E119 Type 2 diabetes mellitus without complications: Secondary | ICD-10-CM

## 2017-11-11 MED ORDER — OXYCODONE-ACETAMINOPHEN 5-325 MG PO TABS: 2.0000 | ORAL_TABLET | ORAL | 0 refills | Status: DC | PRN

## 2017-11-11 NOTE — Patient Instructions (Signed)
Nice to see you. I have refilled your oxycodone.  If you end up needing this chronically we will need to set up a follow-up to go over a pain contract.  You can use this intermittently for now given your acute discomfort. This may make you drowsy so please be careful with this. Please monitor your depression and follow through with psychiatry. Please continue to monitor your blood sugars.
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
## 2017-11-11 NOTE — Assessment & Plan Note (Signed)
Seems to be relatively well controlled.  It is a little surprising to me that without insulin her blood sugar was at goal.  She will continue her current regimen for now.

## 2017-11-11 NOTE — Assessment & Plan Note (Signed)
Patient has done quite well after discharge from inpatient treatment.  She is on Cymbalta and notes no depressive symptoms currently.  They are getting set up with psychiatry and therapy and she is looking forward to this.  She will continue her current medication and monitor.  Given return precautions.

## 2017-11-12 ENCOUNTER — Ambulatory Visit: Payer: Self-pay | Admitting: Family Medicine

## 2017-11-12 ENCOUNTER — Telehealth: Payer: Self-pay

## 2017-11-12 NOTE — Telephone Encounter (Signed)
Do we have these results?

## 2017-11-12 NOTE — Telephone Encounter (Signed)
CT scan of her lumbar spine revealed moderate to severe lower lumbar facet arthritis though did not reveal any spinal canal stenosis or evidence of neural impingement.  This is consistent with her prior MRI.  I would suggest she see GI for the prior CT findings.

## 2017-11-12 NOTE — Telephone Encounter (Signed)
Left message to return call to ask if patient is still interested in seeing GI for prior CT findings, ok for pec to speak to patient

## 2017-11-12 NOTE — Telephone Encounter (Signed)
-----   Message from Glori LuisEric G Sonnenberg, MD sent at 11/11/2017  7:30 PM EST ----- Please contact the patient and let her know that I noticed she was supposed to see GI for prior CT findings that have been seen previously and she previously deferred this. Please see if she would be willing to see them now. Thanks. Eric.

## 2017-11-12 NOTE — Telephone Encounter (Signed)
Patient notified, patient states she wants to hold off on referral until she gets disability or some kind of help financially

## 2017-11-12 NOTE — Telephone Encounter (Signed)
Noted  

## 2017-11-12 NOTE — Telephone Encounter (Signed)
Patient is aware of her results from her scan form last year and she is not having any abdominal pain or problems. Her main concern is the result from the most current scan of her back. She wants to make sure everything is normal there. Told patient I would let the provider know so that he could review and let her know.

## 2017-11-12 NOTE — Telephone Encounter (Signed)
Tried calling, busy tone, ok for pec to speak to patient and inform of results below from Dr.Sonnenberg

## 2017-11-12 NOTE — Telephone Encounter (Signed)
There were findings of inflammation in part of her small intestine. It looks like this was discussed previously in follow-up with Dr Adriana Simasook and was noted on a CT scan she had in the ED last year. There was a recommendation to see GI to have evaluation due to possible inflammatory bowel disease which is a chronic inflammation of the intestines. If she would like to see GI I can place a referral.

## 2017-11-12 NOTE — Telephone Encounter (Signed)
Please advise 

## 2017-11-12 NOTE — Telephone Encounter (Signed)
Relation to pt: self Call back number: [phone]    Reason for call:   Patient returned call and stated she's unaware of CT findings, please advise

## 2017-11-15 ENCOUNTER — Telehealth: Payer: Self-pay | Admitting: Family Medicine

## 2017-11-15 NOTE — Telephone Encounter (Signed)
Please advise the patient that I discussed her blood sugars with Rayfield Citizenaroline and we would like her to check her CBGs 2 hours after eating or prior to lunch and prior to supper to see what they are running at other times of the day.  Thanks.

## 2017-11-15 NOTE — Telephone Encounter (Signed)
-----   Message from Allena KatzCaroline E Welles, [facility] sent at 11/13/2017  2:59 PM EST ----- I'd advise her to check CBGs at other times of the day (2hrs post-prandial or pre-lunch, pre-supper) to see what she's running at other times of the day?   Rayfield Citizenaroline ----- Message ----- From: Glori LuisSonnenberg, Marquet Faircloth G, MD Sent: 11/12/2017   7:11 PM To: Allena Katzaroline E Welles, RPH  I believe she was.  I had no explanation for it and thought it strange as well.  Minerva AreolaEric. ----- Message ----- From: Allena KatzWelles, Caroline E, [facility] Sent: 11/12/2017   4:47 PM To: Glori LuisEric G Destan Franchini, MD  That's bizzare.... Was she eating when she wasn't taking the insulin?   Rayfield Citizenaroline ----- Message ----- From: Glori LuisSonnenberg, Asalee Barrette G, MD Sent: 11/11/2017   7:17 PM To: Allena Katzaroline E Welles, [facility]  Merlene PullingHey Caroline,   I saw Mrs Alberteen SpindleCraddock today for follow-up. She noted that with all her other issues recently she missed 4 days of insulin and noted her cbgs were 118 on the 3 occasions she checked it. She then resumed her insulin and most recently took 28 units. Her glucose after that was 110. I wanted to see if you made anything of this with her glucose having been well controlled with no insulin. Let me know what you think.   Minerva AreolaEric

## 2017-11-16 ENCOUNTER — Telehealth: Payer: Self-pay | Admitting: Family Medicine

## 2017-11-16 ENCOUNTER — Other Ambulatory Visit: Payer: Self-pay | Admitting: Internal Medicine

## 2017-11-16 MED ORDER — OXYCODONE-ACETAMINOPHEN 5-325 MG PO TABS: 2.0000 | ORAL_TABLET | ORAL | 0 refills | Status: DC | PRN

## 2017-11-16 MED ORDER — OXYCODONE-ACETAMINOPHEN 5-325 MG PO TABS: 1.0000 | ORAL_TABLET | Freq: Three times a day (TID) | ORAL | 0 refills | Status: DC | PRN

## 2017-11-16 NOTE — Telephone Encounter (Signed)
Copied from CRM [id number]. Topic: Quick Communication - Rx Refill/Question >> Nov 16, 2017  9:13 AM Arlyss Gandyichardson, Helga Asbury N, NT wrote: Medication:  oxyCODONE-acetaminophen (PERCOCET/ROXICET)   Has the patient contacted their pharmacy? Yes.     (Agent: If no, request that the patient contact the pharmacy for the refill.)   Preferred Pharmacy (with phone number or street name): [facility]  Pt requesting a call once refilled  Agent: Please be advised that RX refills may take up to 3 business days. We ask that you follow-up with your pharmacy.

## 2017-11-16 NOTE — Addendum Note (Signed)
Addended by: Sherlene ShamsULLO, Gayle Martinez L on: 11/16/2017 10:18 AM   Modules accepted: Orders

## 2017-11-16 NOTE — Telephone Encounter (Signed)
Patient notified and will bring readings to her follow up appointment.   Patient states she requested a refill oxycodone, she has 3 tablets left and will be out today. Please advise.Patientis aware it would not be an entire refill just partial.

## 2017-11-16 NOTE — Telephone Encounter (Signed)
Patient notified and informed her to make it last until Tuesday. Rx placed at front desk

## 2017-11-16 NOTE — Telephone Encounter (Addendum)
She has not been set up with Dr Birdie SonsSonnenberg for chronic refills of narcotics.  I will refill for one week only.  She will have to return to him for any more refills once he is back in office she will have to make these 20 last until next Monday so that means nor more than 2-3 daily

## 2017-11-20 ENCOUNTER — Telehealth: Payer: Self-pay

## 2017-11-20 NOTE — Telephone Encounter (Signed)
Copied from CRM [id number]. Topic: Medical Record Request - Other >> Nov 20, 2017  2:38 PM Waymon AmatoBurton, Donna F wrote: The medicaid office is calling to request that we refax the last ov notes from 11/11/17 the original did not come thru Fax number [phone]   [facility]. For all other clinics, route to the clinic's PEC Pool.

## 2017-11-20 NOTE — Telephone Encounter (Signed)
Do you know why they would be requesting this? And who sent it originally?

## 2017-11-23 ENCOUNTER — Other Ambulatory Visit: Payer: Self-pay | Admitting: Family Medicine

## 2017-11-23 NOTE — Telephone Encounter (Signed)
Please advise,do you know anything about this?

## 2017-11-23 NOTE — Telephone Encounter (Signed)
Refill of Percocet  LOV 11/11/17  Dr. Birdie SonsSonnenberg  [facility] [place] - [address]

## 2017-11-23 NOTE — Telephone Encounter (Signed)
Please advise 

## 2017-11-23 NOTE — Telephone Encounter (Signed)
Unknown what this is related to, no call back number given

## 2017-11-23 NOTE — Telephone Encounter (Signed)
No I do not know anything about this probably started with a Medical record request that was sent to records.

## 2017-11-23 NOTE — Telephone Encounter (Signed)
Copied from CRM [id number]. Topic: Quick Communication - Rx Refill/Question >> Nov 23, 2017  9:26 AM Arlyss Gandyichardson, [place] Amendola N, NT wrote: Medication: oxyCODONE-acetaminophen    Has the patient contacted their pharmacy? Yes.     (Agent: If no, request that the patient contact the pharmacy for the refill.)   Preferred Pharmacy (with phone number or street name): [facility]   Agent: Please be advised that RX refills may take up to 3 business days. We ask that you follow-up with your pharmacy.

## 2017-11-23 NOTE — Telephone Encounter (Signed)
No I don't . Ask Olegario MessierKathy.

## 2017-11-24 MED ORDER — OXYCODONE-ACETAMINOPHEN 5-325 MG PO TABS: 1.0000 | ORAL_TABLET | Freq: Three times a day (TID) | ORAL | 0 refills | Status: DC | PRN

## 2017-11-24 NOTE — Telephone Encounter (Signed)
Please advise 

## 2017-11-24 NOTE — Telephone Encounter (Signed)
Ok to refill short term until her visit on Thursday to complete pain contract.

## 2017-11-24 NOTE — Telephone Encounter (Signed)
Laura Small w/ Medicaid calling back because the medical record request was completed and they received it, however it did not include the office notes from 11/11/2017.  Laura Small would like to know it can be faxed to [phone]. Phone:[phone] ext [phone] Laura Cower(Laura Small). Records were being requested because the patient applied for Medicaid and application can not be properly reviewed without all of the notes.

## 2017-11-26 ENCOUNTER — Ambulatory Visit (INDEPENDENT_AMBULATORY_CARE_PROVIDER_SITE_OTHER): Payer: Self-pay | Admitting: Family Medicine

## 2017-11-26 ENCOUNTER — Encounter: Payer: Self-pay | Admitting: Family Medicine

## 2017-11-26 DIAGNOSIS — M545 Low back pain: Secondary | ICD-10-CM

## 2017-11-26 DIAGNOSIS — G8929 Other chronic pain: Secondary | ICD-10-CM

## 2017-11-26 DIAGNOSIS — F322 Major depressive disorder, single episode, severe without psychotic features: Secondary | ICD-10-CM

## 2017-11-26 MED ORDER — HYDROCODONE-ACETAMINOPHEN 5-325 MG PO TABS: 1.0000 | ORAL_TABLET | Freq: Three times a day (TID) | ORAL | 0 refills | Status: DC | PRN

## 2017-11-26 NOTE — Telephone Encounter (Signed)
Patient came for an OV 11/26/17 and I had her sign a release for disability and medicaid for us to send records. I have faxed requested information to medicaid. Patient states we should also be receiving information form disability.

## 2017-11-26 NOTE — Patient Instructions (Signed)
Nice to see you. We had to sign a pain contract today.  This will be scanned into your chart. We will trial you on hydrocodone as prescribed.  Please contact us at least 2 days prior to this running out to receive refills.  I will see you every 3 months for a pain visit.
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
## 2017-11-26 NOTE — Assessment & Plan Note (Addendum)
Patient presents today for chronic pain management visit.  We are going to start her on chronic Norco.  She will be provided with 90 pills to take every 8 hours.  She signed a pain contract and fully understands this.  She is working on Museum/gallery curatorgetting insurance so she can get back in to see a Careers advisersurgeon to consider surgical intervention.  She notes her back pain has been stable and has responded to narcotics over the last several weeks.  This has allowed her to function.  She is aware of the potential for addiction with these medications.  She has been advised not to discontinue this medication on her own.  She will follow-up with me in 3 months.  Drug database has been reviewed.  Suspect soft tissue lesion with possible lipoma as the cause of the lump.  No noted abnormalities reported on CT scan.  Discussed monitoring and if changing or any other symptoms being reevaluated.

## 2017-11-27 ENCOUNTER — Telehealth: Payer: Self-pay | Admitting: Pharmacy Technician

## 2017-11-27 NOTE — Telephone Encounter (Signed)
Patient failed to provide 2019 financial documentation.  No additional medication assistance will be provided by [facility] without the required proof of income documentation.  Patient notified by letter.  Wolf Boulay J. Harold Moncus Care Manager [facility] 

## 2017-12-01 ENCOUNTER — Inpatient Hospital Stay: Payer: Self-pay | Admitting: Family Medicine

## 2017-12-09 ENCOUNTER — Telehealth: Payer: Self-pay

## 2017-12-09 NOTE — Telephone Encounter (Signed)
Patient states she is no and has nott had any chest pain or shortness of breath. Informed patient if she does she needs to be evaluated

## 2017-12-09 NOTE — Telephone Encounter (Signed)
Noted.  I would suggest evaluation if her back continues to bother her.

## 2017-12-09 NOTE — Telephone Encounter (Signed)
Please confirm if she was having chest pain and shortness of breath. That is what was relayed to me by the front office staff. If she has been having those symptoms she needs to go to the ED. Thanks.

## 2017-12-09 NOTE — Telephone Encounter (Signed)
Patient states her symptoms have worsened, she states her right leg felt numb last night, she states her pain has worsened. She states it only happens at night. Patient states it is back to the normal pain and now and is manageable with the pain medication. Informed patient that she will need to be evaluated in the ER or urgent care for worsening and new symptoms. Patient states she will go to the ER if it happens again but not at this time. She just wanted to give you an [place]

## 2017-12-10 NOTE — Telephone Encounter (Signed)
Patient was notified.

## 2017-12-18 ENCOUNTER — Encounter: Payer: Self-pay | Admitting: Family Medicine

## 2017-12-21 ENCOUNTER — Other Ambulatory Visit: Payer: Self-pay | Admitting: Family Medicine

## 2017-12-21 NOTE — Telephone Encounter (Signed)
Copied from CRM [id number] [id number]. Topic: Quick Communication - Rx Refill/Question >> Dec 21, 2017  1:06 PM Landry MellowFoltz, Melissa J wrote: Medication: HYDROcodone-acetaminophen (NORCO/VICODIN) 5-325 MG tablet Has the patient contacted their pharmacy? No. - controlled drug (Agent: If no, request that the patient contact the pharmacy for the refill.) Preferred Pharmacy (with phone number or street name): [facility]  Agent: Please be advised that RX refills may take up to 3 business days. We ask that you follow-up with your pharmacy.

## 2017-12-21 NOTE — Telephone Encounter (Signed)
LOV: 11/26/17  Dr. Yaakov GuthrieSonnenberg  [facility]      [place]

## 2017-12-22 NOTE — Telephone Encounter (Signed)
Please advise 

## 2017-12-23 MED ORDER — HYDROCODONE-ACETAMINOPHEN 5-325 MG PO TABS: 1.0000 | ORAL_TABLET | Freq: Three times a day (TID) | ORAL | 0 refills | Status: DC | PRN

## 2017-12-23 NOTE — Telephone Encounter (Signed)
Drug database reviewed.  No aberrant activity.  Hydrocodone refill sent to pharmacy to be filled on 12/27/17 as that would be 30 days from her last fill.

## 2017-12-23 NOTE — Telephone Encounter (Signed)
Last filled 11/26/17 90 0rf

## 2018-01-02 ENCOUNTER — Emergency Department
Admission: EM | Admit: 2018-01-02 | Discharge: 2018-01-03 | Disposition: A | Discharge status: Home / Self Care | Payer: Self-pay | Attending: Emergency Medicine | Admitting: Emergency Medicine

## 2018-01-02 ENCOUNTER — Other Ambulatory Visit: Payer: Self-pay

## 2018-01-02 ENCOUNTER — Encounter: Payer: Self-pay | Admitting: Emergency Medicine

## 2018-01-02 DIAGNOSIS — M5441 Lumbago with sciatica, right side: Secondary | ICD-10-CM | POA: Insufficient documentation

## 2018-01-02 DIAGNOSIS — Z794 Long term (current) use of insulin: Secondary | ICD-10-CM | POA: Insufficient documentation

## 2018-01-02 DIAGNOSIS — E119 Type 2 diabetes mellitus without complications: Secondary | ICD-10-CM | POA: Insufficient documentation

## 2018-01-02 DIAGNOSIS — M5442 Lumbago with sciatica, left side: Secondary | ICD-10-CM | POA: Insufficient documentation

## 2018-01-02 DIAGNOSIS — Z87891 Personal history of nicotine dependence: Secondary | ICD-10-CM | POA: Insufficient documentation

## 2018-01-02 DIAGNOSIS — G8929 Other chronic pain: Secondary | ICD-10-CM | POA: Insufficient documentation

## 2018-01-02 DIAGNOSIS — Z79899 Other long term (current) drug therapy: Secondary | ICD-10-CM | POA: Insufficient documentation

## 2018-01-02 DIAGNOSIS — Z7982 Long term (current) use of aspirin: Secondary | ICD-10-CM | POA: Insufficient documentation

## 2018-01-02 MED ORDER — KETOROLAC TROMETHAMINE 10 MG PO TABS: 10.0000 mg | ORAL_TABLET | Freq: Four times a day (QID) | ORAL | 0 refills | Status: DC | PRN

## 2018-01-02 MED ORDER — METAXALONE 800 MG PO TABS: 800.0000 mg | ORAL_TABLET | Freq: Three times a day (TID) | ORAL | 1 refills | Status: DC

## 2018-01-02 MED ORDER — GABAPENTIN 300 MG PO CAPS: 300.0000 mg | ORAL_CAPSULE | Freq: Every day | ORAL | 2 refills | Status: DC

## 2018-01-02 MED ORDER — ONDANSETRON 8 MG PO TBDP
8.0000 mg | ORAL_TABLET | Freq: Once | ORAL | Status: AC
  Administered 2018-01-02: 8 mg via ORAL
  Filled 2018-01-02: qty 1

## 2018-01-02 MED ORDER — KETOROLAC TROMETHAMINE 30 MG/ML IJ SOLN
30.0000 mg | Freq: Once | INTRAMUSCULAR | Status: AC
  Administered 2018-01-02: 30 mg via INTRAMUSCULAR
  Filled 2018-01-02: qty 1

## 2018-01-02 MED ORDER — ORPHENADRINE CITRATE 30 MG/ML IJ SOLN
60.0000 mg | Freq: Once | INTRAMUSCULAR | Status: AC
  Administered 2018-01-02: 60 mg via INTRAMUSCULAR
  Filled 2018-01-02: qty 2

## 2018-01-02 MED ORDER — HYDROMORPHONE HCL 1 MG/ML IJ SOLN
1.0000 mg | Freq: Once | INTRAMUSCULAR | Status: AC
  Administered 2018-01-02: 1 mg via INTRAMUSCULAR
  Filled 2018-01-02: qty 1

## 2018-01-02 NOTE — ED Provider Notes (Signed)
[facility] Emergency Department Provider Note  ____________________________________________  Time seen: Approximately 10:37 PM  I have reviewed the triage vital signs and the nursing notes.   HISTORY  Chief Complaint Back Pain    HPI Laura Small is a 57 y.o. female who presents the emergency department via EMS for acute on chronic lower back pain.  Patient had lumbar surgery approximately 18 years ago and has had varying degrees of chronic back pain since.  Patient has had an increase in back pain over the last 18 months.  The patient has been in contact with her primary care seeking to increase her Vicodin prescription.  Patient takes Vicodin, 5 mg 3 times a day.  Patient reports that this medication has not been controlling her pain recently.  On reviewing patient's records in epic, patient has had multiple contacts with multiple physicians requesting increase of her chronic pain medicine.  Patient has had no bowel or bladder dysfunction, saddle anesthesia, paresthesias.  No recent trauma.  Patient denies any urinary symptoms.  Past Medical History:  Diagnosis Date  . Back pain   . Chicken pox   . Diabetes mellitus without complication (HCC)   . Migraines   . PONV (postoperative nausea and vomiting)    usually has 1 episode of vomiting within 1 hour of anesthesia     Patient Active Problem List   Diagnosis Date Noted  . Depression, major, single episode, severe (HCC) 10/12/2017  . Muscle spasms of both lower extremities 10/12/2017  . Aortic atherosclerosis (HCC) 01/08/2017  . Tobacco abuse 01/08/2017  . Ileitis 01/08/2017  . DM (diabetes mellitus), type 2 (HCC) 09/23/2016  . Hyperlipidemia 09/23/2016  . Chronic low back pain 09/08/2016    Past Surgical History:  Procedure Laterality Date  . BACK SURGERY    . CHOLECYSTECTOMY    . INSERTION OF MESH N/A 02/12/2015   Procedure: INSERTION OF MESH;  Surgeon: Natale LayMark Bird, MD;  Location: [facility]  ORS;  Service: General;  Laterality: N/A;  . VENTRAL HERNIA REPAIR N/A 02/12/2015   Procedure: HERNIA REPAIR VENTRAL ADULT;  Surgeon: Natale LayMark Bird, MD;  Location: [facility] ORS;  Service: General;  Laterality: N/A;    Prior to Admission medications   Medication Sig Start Date End Date Taking? Authorizing Provider  aspirin EC 81 MG tablet Take 1 tablet (81 mg total) by mouth daily. 10/15/17   Pucilowska, Braulio ConteJolanta B, MD  DULoxetine (CYMBALTA) 60 MG capsule Take 1 capsule (60 mg total) by mouth daily. 10/16/17   Pucilowska, Braulio ConteJolanta B, MD  HYDROcodone-acetaminophen (NORCO/VICODIN) 5-325 MG tablet Take 1 tablet by mouth every 8 (eight) hours as needed for moderate pain. 12/27/17   Glori LuisSonnenberg, Eric G, MD  Insulin Glargine (BASAGLAR KWIKPEN) 100 UNIT/ML SOPN Inject 0.4 mLs (40 Units total) into the skin every morning. Increase per instructions. 10/15/17   Pucilowska, Braulio ConteJolanta B, MD  Insulin Pen Needle 32G X 6 MM MISC Use as directed for once daily insulin. E11.9 10/15/17   Pucilowska, Braulio ConteJolanta B, MD  metFORMIN (GLUCOPHAGE XR) 500 MG 24 hr tablet Take 1 tablet (500 mg total) by mouth 2 (two) times daily. Patient taking differently: Take 500 mg by mouth daily with breakfast.  10/15/17   Pucilowska, Ellin GoodieJolanta B, MD    Allergies Bee venom; Advil [ibuprofen]; Jardiance [empagliflozin]; Nitroglycerin; and Prednisone  Family History  Problem Relation Age of Onset  . Diabetes Mother   . Gallbladder disease Mother   . Heart disease Father   . Alcohol abuse Father   .  Gallbladder disease Father   . Heart disease Paternal Grandmother   . Colon cancer Neg Hx     Social History Social History   Tobacco Use  . Smoking status: Former Smoker    Packs/day: 0.00    Types: Cigarettes  . Smokeless tobacco: Never Used  Substance Use Topics  . Alcohol use: No  . Drug use: No     Review of Systems  Constitutional: No fever/chills Eyes: No visual changes. No discharge ENT: No upper respiratory  complaints. Cardiovascular: no chest pain. Respiratory: no cough. No SOB. Gastrointestinal: No abdominal pain.  No nausea, no vomiting.  No diarrhea.  No constipation. Genitourinary: Negative for dysuria. No hematuria Musculoskeletal: Positive for increase of her chronic back pain. Skin: Negative for rash, abrasions, lacerations, ecchymosis. Neurological: Negative for headaches, focal weakness or numbness. 10-point ROS otherwise negative.  ____________________________________________   PHYSICAL EXAM:  VITAL SIGNS: ED Triage Vitals [01/02/18 2149]  Enc Vitals Group     BP (!) 143/98     Pulse Rate (!) 107     Resp 18     Temp 97.8 F (36.6 C)     Temp Source Oral     SpO2 94 %     Weight 232 lb (105.2 kg)     Height 5\' 7"  (1.702 m)     Head Circumference      Peak Flow      Pain Score 10     Pain Loc      Pain Edu?      Excl. in GC?      Constitutional: Alert and oriented. Well appearing and in no acute distress. Eyes: Conjunctivae are normal. PERRL. EOMI. Head: Atraumatic. Neck: No stridor.  No cervical spine tenderness to palpation.  Cardiovascular: Normal rate, regular rhythm. Normal S1 and S2.  Good peripheral circulation. Respiratory: Normal respiratory effort without tachypnea or retractions. Lungs CTAB. Good air entry to the bases with no decreased or absent breath sounds. Gastrointestinal: Bowel sounds 4 quadrants. Soft and nontender to palpation. No guarding or rigidity. No palpable masses. No distention. No CVA tenderness. Musculoskeletal: Full range of motion to all extremities. No gross deformities appreciated.  Visualization of the lumbar spine reveals no visible abnormality.  Patient is diffusely tender to palpation in the lumbar spine with no specific point tenderness and no palpable abnormality.  Patient is diffusely tender to palpation over bilateral paraspinal muscle groups as well.  Spasms are appreciated on the left when compared with right.  Patient is  tender to palpation over bilateral sciatic notches.  Patient declined to perform straight leg raise due to pain.  Dorsalis pedis pulse intact bilateral lower extremities.  Sensation intact and equal bilateral lower extremity's. Neurologic:  Normal speech and language. No gross focal neurologic deficits are appreciated.  Skin:  Skin is warm, dry and intact. No rash noted. Psychiatric: Mood and affect are normal. Speech and behavior are normal. Patient exhibits appropriate insight and judgement.   ____________________________________________   LABS (all labs ordered are listed, but only abnormal results are displayed)  Labs Reviewed - No data to display ____________________________________________  EKG   ____________________________________________  RADIOLOGY   No results found.  ____________________________________________    PROCEDURES  Procedure(s) performed:    Procedures    Medications  orphenadrine (NORFLEX) injection 60 mg (has no administration in time range)  HYDROmorphone (DILAUDID) injection 1 mg (1 mg Intramuscular Given 01/02/18 2300)  ketorolac (TORADOL) 30 MG/ML injection 30 mg (30 mg Intramuscular Given 01/02/18  2305)  ondansetron (ZOFRAN-ODT) disintegrating tablet 8 mg (8 mg Oral Given 01/02/18 2300)     ____________________________________________   INITIAL IMPRESSION / ASSESSMENT AND PLAN / ED COURSE  Pertinent labs & imaging results that were available during my care of the patient were reviewed by me and considered in my medical decision making (see chart for details).  Review of the [place] CSRS was performed in accordance of the [facility] prior to dispensing any controlled drugs.     Patient's diagnosis is consistent with chronic back pain.  Patient presents with an increase of her chronic lower back pain.  No recent trauma.  She denies any urinary symptoms.  On exam, patient was diffusely tender to palpation with some spasms appreciated.  Patient was  seeking pain relief.  I discussed with patient that since she is on pain management contract I cannot discharge her home with any narcotics.  I did prescribe gabapentin for her chronic neuropathic/radicular pain.  Patient did not have any concerning symptoms of bowel or bladder dysfunction, saddle anesthesia or paresthesias.  At this time, no indication for imaging.  Patient was given injection of insulin, muscle relaxer, Dilaudid for pain relief in the emergency department..  She states that she is to see neurosurgery, however due to having no insurance she is unable to do same.  Patient is advised to follow-up with primary care to discuss increase of her chronic pain medication if she so desires.  Patient is given ED precautions to return to the ED for any worsening or new symptoms.     ____________________________________________  FINAL CLINICAL IMPRESSION(S) / ED DIAGNOSES  Final diagnoses:  Chronic midline low back pain with bilateral sciatica      NEW MEDICATIONS STARTED DURING THIS VISIT:  ED Discharge Orders    None          This chart was dictated using voice recognition software/Dragon. Despite best efforts to proofread, errors can occur which can change the meaning. Any change was purely unintentional.    Racheal Patches, PA-C 01/02/18 2314    Jene Every, MD 01/02/18 2321
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
## 2018-01-02 NOTE — ED Triage Notes (Signed)
Chronic back pain - sees dr.sonnenberg who prescribes vicodin 3x day. Last vicodin at 4pm. States had disc surgery but unable to "find that doctor" to follow up with.

## 2018-01-04 ENCOUNTER — Telehealth: Payer: Self-pay | Admitting: Family Medicine

## 2018-01-04 ENCOUNTER — Encounter: Payer: Self-pay | Admitting: Family Medicine

## 2018-01-04 ENCOUNTER — Other Ambulatory Visit: Payer: Self-pay

## 2018-01-04 ENCOUNTER — Ambulatory Visit (INDEPENDENT_AMBULATORY_CARE_PROVIDER_SITE_OTHER): Payer: Self-pay | Admitting: Family Medicine

## 2018-01-04 DIAGNOSIS — Z794 Long term (current) use of insulin: Secondary | ICD-10-CM

## 2018-01-04 DIAGNOSIS — M545 Low back pain: Secondary | ICD-10-CM

## 2018-01-04 DIAGNOSIS — G8929 Other chronic pain: Secondary | ICD-10-CM

## 2018-01-04 DIAGNOSIS — E119 Type 2 diabetes mellitus without complications: Secondary | ICD-10-CM

## 2018-01-04 MED ORDER — KETOROLAC TROMETHAMINE 60 MG/2ML IM SOLN
60.0000 mg | Freq: Once | INTRAMUSCULAR | Status: AC
  Administered 2018-01-04: 60 mg via INTRAMUSCULAR

## 2018-01-04 MED ORDER — OXYCODONE-ACETAMINOPHEN 5-325 MG PO TABS: 1.0000 | ORAL_TABLET | Freq: Four times a day (QID) | ORAL | 0 refills | Status: DC | PRN

## 2018-01-04 NOTE — Assessment & Plan Note (Signed)
Seems to be well controlled.  Plan for A1c in the next several weeks.

## 2018-01-04 NOTE — Assessment & Plan Note (Addendum)
Patient seen today for follow-up of her chronic low back pain.  She had an exacerbation that required an ED visit.  She did receive medication in the ED that was beneficial.  She continues to have discomfort that is somewhat relieved by Vicodin.  She has responded better to Percocet in the past.  We will change her to Percocet.  She will discontinue the Vicodin and 6 hours later start the Percocet.  Discussed not taking both of these at the same time.  Discussed that the Percocet may make her drowsy and if it does she needs to be careful.  She was prescribed Toradol orally and Skelaxin in the ED. discussed only taking the Toradol for 3 days total.  I advised that she could trial those as well.  She will not mix the Skelaxin with the Percocet at the same time and she will space them out by at least 3 hours.  She was given patient assistance paperwork to fill out so that we can try to get her in to see a spine surgeon to discuss possible treatments.  At the end of the visit the patient's husband noted with where her pain is now he is not sure he will be able to get her into the car.  She was given a Toradol shot in the office as she tolerated this previously.  This was very beneficial and she was able to better tolerate the neurological exam in her lower extremities with 5 out of 5 strength.  Return precautions given in AVS.

## 2018-01-04 NOTE — Telephone Encounter (Signed)
Called [facility] and informed the pharmacist that the patient will be dropping the hydrocodone off to them and that Dr. Birdie SonsSonnenberg would like for him to call the office to let us know that the patient did drop the hydrocodone off.

## 2018-01-04 NOTE — Progress Notes (Signed)
Marikay Alar, MD Phone: [phone]  Laura Small is a 57 y.o. female who presents today for f/u.  Chronic back pain: Patient has chronic low back pain related to degenerative changes.  She ended up in the emergency department as she had an episode last Thursday night where she felt a crunching and snapping while she was just laying there in bed.  She did not moved to cause this.  She had difficulty moving her legs due to the pain and had to be transported by EMS.  Her pain improved with treatment in the ED though continues to be recurrent after discharge.  She has had chronic low back pain for some time now.  She is on a pain contract for Vicodin.  She was treated with Dilaudid as well as Toradol in the ED with good benefit.  She has been taking Vicodin 3 times daily.  It lasts anywhere from 4-8 hours.  About halfway through she starts to feel pain.  No constipation from this.  She notes no incontinence or saddle anesthesia.  Some intermittent numbness in her right leg and scattered spots that occurs when her pain is exacerbated.  She has at times been screaming and in tears.  She feels the oxycodone worked better previously.  Depression: She notes no depressive symptoms.  No SI.  She is currently on Cymbalta.  Diabetes: Blood sugars running 92-1 20.  She stopped metformin.  She continues on insulin.  No polyuria or polydipsia.  No hypoglycemia.  Social History   Tobacco Use  Smoking Status Former Smoker  . Packs/day: 0.00  . Types: Cigarettes  Smokeless Tobacco Never Used     ROS see history of present illness  Objective  Physical Exam Vitals:   01/04/18 0801  BP: 140/80  Pulse: 79  Temp: 98.4 F (36.9 C)  SpO2: 98%    BP Readings from Last 3 Encounters:  01/04/18 140/80  01/02/18 130/68  11/26/17 136/82   Wt Readings from Last 3 Encounters:  01/04/18 230 lb 3.2 oz (104.4 kg)  01/02/18 232 lb (105.2 kg)  11/26/17 224 lb 12.8 oz (102 kg)    Physical  Exam  Constitutional: No distress.  Cardiovascular: Normal rate, regular rhythm and normal heart sounds.  Pulmonary/Chest: Effort normal and breath sounds normal.  Musculoskeletal: She exhibits no edema.  Diffuse low back tenderness with no overlying skin changes  Neurological: She is alert.  Patient with difficulty participating in lower extremity neurological exam given discomfort on trying to move her legs, her sensation to light touch is intact, she has 5 out of 5 plantarflexion discomfort, she declines doing the rest of the exam related to pain  Skin: Skin is warm and dry. She is not diaphoretic.     Assessment/Plan: Please see individual problem list.  DM (diabetes mellitus), type 2 (HCC) Seems to be well controlled.  Plan for A1c in the next several weeks.  Depression, major, single episode, severe (HCC) Asymptomatic.  Continue Cymbalta.  Chronic low back pain Patient seen today for follow-up of her chronic low back pain.  She had an exacerbation that required an ED visit.  She did receive medication in the ED that was beneficial.  She continues to have discomfort that is somewhat relieved by Vicodin.  She has responded better to Percocet in the past.  We will change her to Percocet.  She will discontinue the Vicodin and 6 hours later start the Percocet.  Discussed not taking both of these at the same  time.  Discussed that the Percocet may make her drowsy and if it does she needs to be careful.  She was prescribed Toradol orally and Skelaxin in the ED. discussed only taking the Toradol for 3 days total.  I advised that she could trial those as well.  She will not mix the Skelaxin with the Percocet at the same time and she will space them out by at least 3 hours.  She was given patient assistance paperwork to fill out so that we can try to get her in to see a spine surgeon to discuss possible treatments.  At the end of the visit the patient's husband noted with where her pain is now he is  not sure he will be able to get her into the car.  She was given a Toradol shot in the office as she tolerated this previously.  This was very beneficial and she was able to better tolerate the neurological exam in her lower extremities with 5 out of 5 strength.  Return precautions given in AVS.   Health Maintenance: She declined mammogram and colon cancer screening at this time as she would prefer to try to get her pain under better control before proceeding with these things.  No orders of the defined types were placed in this encounter.   Meds ordered this encounter  Medications  . oxyCODONE-acetaminophen (PERCOCET) 5-325 MG tablet    Sig: Take 1 tablet by mouth every 6 (six) hours as needed for severe pain.    Dispense:  120 tablet    Refill:  0  . ketorolac (TORADOL) injection 60 mg  toradol 30 mg given.    Marikay AlarEric Khristie Sak, MD [facility]

## 2018-01-04 NOTE — Telephone Encounter (Signed)
Talked with pharmacist at [facility].  They expressed concern about the patient just having picked up hydrocodone and now getting a prescription for oxycodone.  I discussed that we are transitioning her to the oxycodone.  I advised him that I have advised the patient to dispose of the hydrocodone at the police station and the pharmacist requested proof that this has happened.  I advised that I would speak with the patient regarding this.  I spoke with the patient and she noted it would be easier to return the hydrocodone to the pharmacy to dispose of this.  I will have the CMA contact the patient's pharmacy to let the pharmacist know that the patient would be disposing of her hydrocodone through the pharmacy and I would ask that they contact us once this has occurred. If she does not bring the hydrocodone in to dispose of they should not fill the oxycodone. Thanks.

## 2018-01-04 NOTE — Assessment & Plan Note (Signed)
Asymptomatic.  Continue Cymbalta. 

## 2018-01-04 NOTE — Telephone Encounter (Signed)
Called -  they went to pick up oxycodone at the pharmacy - they can not afford the medication until Wednesday.  They have already turned in the medication to the sherrifs dept.  Pt does not have any medications for pain.  The only medication the pt was able to get was the Toradol.

## 2018-01-04 NOTE — Patient Instructions (Signed)
Nice to see you. We will change you to percocet for your pain. Please discontinue the vicodin and with the next dose start on the Percocet at least 6 hours after your Vicodin dose.  Please be careful as this may make you drowsy.  If this does make you drowsy please do not drive. You can take the Toradol and Skelaxin that were prescribed to the ED.  See if these are beneficial.  Please do not take the Skelaxin at the same time as the Percocet or Vicodin as this may make you excessively drowsy. If you develop bowel or bladder incontinence, numbness between your legs, or worsening symptoms please be reevaluated.

## 2018-01-04 NOTE — Progress Notes (Signed)
Torodol given in left buttock 30 mg. Patient tolerated well.

## 2018-01-04 NOTE — Telephone Encounter (Signed)
Patient called and stated that she took the hydrocodone to the police department then went to the pharmacy and stated that she would not be able to afford the oxycodone until Wednesday.

## 2018-01-04 NOTE — Progress Notes (Signed)
Discussed disposal of her excess vicodin during her visit. She noted that they would take this to the police station to dispose of. I advised this would be adequate. I received a call from the patients pharmacy regarding the percocet prescription being given so close to the recent vicodin refill. I advised the pharmacist we would be transitioning her to percocet and that she would no longer be taking the vicodin. I advised that the patient would be disposing of this at the police department and the pharmacist asked that the patient provide proof that she has disposed of this at the police department. I advised that I would discuss this with the patient. I also advised it would be ok to fill the percocet for the patient to pick up. I discussed the disposal of the vicodin with the patient on the phone after discussing with the pharmacist and the patient advised it would be easier to dispose of this at the pharmacy. I advised her to do this. My CMA will contact the pharmacy regarding this. Please see the phone note from today.

## 2018-01-04 NOTE — Telephone Encounter (Signed)
Noted. [facility] confirm that she turned the meds in if they can release that info? Thanks.

## 2018-01-06 NOTE — Telephone Encounter (Signed)
Was not able to get in contact with Sheriff's department to verify if patient dropped off medication.

## 2018-01-13 ENCOUNTER — Encounter: Payer: Self-pay | Admitting: Family Medicine

## 2018-01-13 ENCOUNTER — Ambulatory Visit (INDEPENDENT_AMBULATORY_CARE_PROVIDER_SITE_OTHER): Payer: Self-pay | Admitting: Family Medicine

## 2018-01-13 DIAGNOSIS — E119 Type 2 diabetes mellitus without complications: Secondary | ICD-10-CM

## 2018-01-13 DIAGNOSIS — Z794 Long term (current) use of insulin: Secondary | ICD-10-CM

## 2018-01-13 DIAGNOSIS — G8929 Other chronic pain: Secondary | ICD-10-CM

## 2018-01-13 DIAGNOSIS — E782 Mixed hyperlipidemia: Secondary | ICD-10-CM

## 2018-01-13 DIAGNOSIS — M545 Low back pain: Secondary | ICD-10-CM

## 2018-01-13 LAB — POCT GLYCOSYLATED HEMOGLOBIN (HGB A1C): Hemoglobin A1C: 6.7

## 2018-01-13 MED ORDER — METAXALONE 800 MG PO TABS: 800.0000 mg | ORAL_TABLET | Freq: Three times a day (TID) | ORAL | 1 refills | Status: AC

## 2018-01-13 NOTE — Patient Instructions (Signed)
Nice to see you. Please continue your current diabetes regimen. If you change your mind regarding statins for your cholesterol please let us know. Do not drink alcohol while on your oxycodone.  Do not take the Skelaxin at the same time as the oxycodone.  If you get drowsy with these medications do not drive and please let us know.

## 2018-01-13 NOTE — Assessment & Plan Note (Addendum)
Uncontrolled.  She is hesitant to start back on medication for this.  Discussed the risk of stroke and heart attack given her elevated LDL cholesterol.  She will contact us if she changes her mind.

## 2018-01-13 NOTE — Assessment & Plan Note (Signed)
Well-controlled.  Continue current regimen.  A1c checked today.  She has deferred health maintenance issues at this time given financial constraints.

## 2018-01-13 NOTE — Progress Notes (Signed)
Marikay AlarEric Rubylee Zamarripa, MD Phone: [phone]  Maceo ProShannon Denise Alberteen SpindleCraddock is a 57 y.o. female who presents today for f/u.  DIABETES Disease Monitoring: Blood Sugar ranges-99-121 Polyuria/phagia/dipsia- no      Optho- overdue Medications: Compliance- taking basaglar 28-40 units depending on what her CBG is, occasionally takes the metformin Hypoglycemic symptoms- no  HYPERLIPIDEMIA Symptoms Chest pain on exertion:  no   Leg claudication:   no Patient defers medication at this time.  Chronic low back pain: Patient has done well on Percocet every 6 hours.  She has been taking Skelaxin every 6 hours as well.  Neither of these make her drowsy.  She does not drink any alcohol.  She declines drug use.  These things help her function and she is able to get up and walk around now with minimal issues.  No numbness, weakness, incontinence, or saddle anesthesia.    Social History   Tobacco Use  Smoking Status Former Smoker  . Packs/day: 0.00  . Types: Cigarettes  Smokeless Tobacco Never Used     ROS see history of present illness  Objective  Physical Exam Vitals:   01/13/18 1446  BP: 130/88  Pulse: 83  Temp: 97.6 F (36.4 C)  SpO2: 98%    BP Readings from Last 3 Encounters:  01/13/18 130/88  01/04/18 140/80  01/02/18 130/68   Wt Readings from Last 3 Encounters:  01/13/18 227 lb 3.2 oz (103.1 kg)  01/04/18 230 lb 3.2 oz (104.4 kg)  01/02/18 232 lb (105.2 kg)    Physical Exam  Constitutional: No distress.  Cardiovascular: Normal rate, regular rhythm and normal heart sounds.  Pulmonary/Chest: Effort normal and breath sounds normal.  Musculoskeletal: She exhibits no edema.  No midline spine tenderness, no midline spine step-off, there is diffuse muscular back tenderness in her low back which is improved from prior, 5/5 strength bilateral quads, hamstrings, plantar flexion, and dorsiflexion, sensation light touch intact bilateral lower extremities, patient is able to stand up today  with minimal pain  Neurological: She is alert.  Skin: Skin is warm and dry. She is not diaphoretic.     Assessment/Plan: Please see individual problem list.  DM (diabetes mellitus), type 2 (HCC) Well-controlled.  Continue current regimen.  A1c checked today.  She has deferred health maintenance issues at this time given financial constraints.  Hyperlipidemia Uncontrolled.  She is hesitant to start back on medication for this.  Discussed the risk of stroke and heart attack given her elevated LDL cholesterol.  She will contact us if she changes her mind.  Chronic low back pain Patient is able to function with her oxycodone and also Skelaxin.  We will refill the Skelaxin.  She will not take these at the same time.  If she develops drowsiness she will let us know.  She will not drink alcohol or take illicit drugs with these medications.  Advised not to drive if she gets drowsy with these medications.  She is working on financial assistance so that we can get her to see pain management.   Health Maintenance: Patient has deferred colonoscopy referral as well as mammogram until her back is improved.  Orders Placed This Encounter  Procedures  . POCT HgB A1C    Meds ordered this encounter  Medications  . metaxalone (SKELAXIN) 800 MG tablet    Sig: Take 1 tablet (800 mg total) by mouth 3 (three) times daily.    Dispense:  90 tablet    Refill:  1     Marikay AlarEric Geanette Buonocore,  MD [place]

## 2018-01-13 NOTE — Assessment & Plan Note (Addendum)
Patient is able to function with her oxycodone and also Skelaxin.  We will refill the Skelaxin.  She will not take these at the same time.  If she develops drowsiness she will let us know.  She will not drink alcohol or take illicit drugs with these medications.  Advised not to drive if she gets drowsy with these medications.  She is working on financial assistance so that we can get her to see pain management.

## 2018-01-20 ENCOUNTER — Telehealth: Payer: Self-pay | Admitting: Pharmacy Technician

## 2018-01-20 NOTE — Telephone Encounter (Signed)
Received updated proof of income.  Patient eligible to receive medication assistance at [facility] through 2019, as long as eligibility requirements continue to be met.  [facility]

## 2018-01-26 ENCOUNTER — Ambulatory Visit: Payer: Self-pay | Admitting: Family Medicine

## 2018-02-01 ENCOUNTER — Other Ambulatory Visit: Payer: Self-pay | Admitting: Family Medicine

## 2018-02-01 NOTE — Telephone Encounter (Signed)
Pt requesting refill for Percocet 5-325 mg tab LR 01/04/18 for #120 tabs She is requesting #124 tabs because this month has 31 days.   Provider  E. Sonnenberg LOV  01/13/18 NOV  04/14/18  Pharmacy  [facility]  [place]

## 2018-02-01 NOTE — Telephone Encounter (Unsigned)
Copied from CRM [id number]. Topic: Quick Communication - Rx Refill/Question >> Feb 01, 2018 11:55 AM Mcneil, Ja-Kwan wrote: Medication: oxyCODONE-acetaminophen (PERCOCET) 5-325 MG tablet,  Pt requesting 124 tablets due to this being a 31 day month   Preferred Pharmacy (with phone number or street name): [facility] [place],  - [address] [phone] (Phone) [phone] (Fax)  Agent: Please be advised that RX refills may take up to 3 business days. We ask that you follow-up with your pharmacy.

## 2018-02-02 MED ORDER — OXYCODONE-ACETAMINOPHEN 5-325 MG PO TABS: 1.0000 | ORAL_TABLET | Freq: Four times a day (QID) | ORAL | 0 refills | Status: DC | PRN

## 2018-02-02 NOTE — Telephone Encounter (Signed)
We will not deviate from her typical 120 tablets/month.  Refill sent to pharmacy.

## 2018-02-02 NOTE — Telephone Encounter (Signed)
Please advise 

## 2018-02-11 ENCOUNTER — Telehealth: Payer: Self-pay | Admitting: Family Medicine

## 2018-02-11 NOTE — Telephone Encounter (Signed)
[facility], Drinda Butts dropped off forms to be signed. Papers are up front in Dr. Purvis Sheffield color folder. Please call highlighted phone when forms are ready for pick up.

## 2018-02-12 NOTE — Telephone Encounter (Signed)
Called medication management, they are aware form is at front desk ready to be picked up

## 2018-02-12 NOTE — Telephone Encounter (Signed)
Placed in red folder  

## 2018-02-12 NOTE — Telephone Encounter (Signed)
Signed.

## 2018-02-19 ENCOUNTER — Telehealth: Payer: Self-pay

## 2018-02-19 NOTE — Telephone Encounter (Signed)
Copied from CRM [id number]. Topic: General - Other >> Feb 19, 2018  2:44 PM Mcneil, Ja-Kwan wrote: Reason for CRM: Pt stated med management will not have the Rx for metaxalone (SKELAXIN) 800 MG tablet in for 4-6 weeks. Pt asked if there are any samples that she can be given and if not are there any alternatives other than methocarbemal. Pt requests a return call. Cb# [phone].

## 2018-02-19 NOTE — Telephone Encounter (Signed)
We do not have any samples of this.  Flexeril would be the other alternative. thanks.

## 2018-02-25 ENCOUNTER — Telehealth: Payer: Self-pay | Admitting: Family Medicine

## 2018-02-25 NOTE — Telephone Encounter (Signed)
Placed in red folder  

## 2018-02-25 NOTE — Telephone Encounter (Signed)
[facility] dropped off a form to filled out.. Placed in Dr. Birdie SonsSonnenberg color folder upfront.. Please call Rhetta Murannette Johnson @ [phone] when completed

## 2018-02-26 NOTE — Telephone Encounter (Signed)
Signed. Given to [place].

## 2018-02-26 NOTE — Telephone Encounter (Signed)
Notified medication management forms are ready, placed at front desk

## 2018-03-01 ENCOUNTER — Other Ambulatory Visit: Payer: Self-pay | Admitting: Family Medicine

## 2018-03-02 MED ORDER — OXYCODONE-ACETAMINOPHEN 5-325 MG PO TABS: 1.0000 | ORAL_TABLET | Freq: Four times a day (QID) | ORAL | 0 refills | Status: DC | PRN

## 2018-03-02 NOTE — Telephone Encounter (Signed)
Sent to pharmacy.  Drug database reviewed. 

## 2018-03-02 NOTE — Telephone Encounter (Signed)
Last OV 01/13/18 last filled 02/02/18 120 0rf

## 2018-03-12 ENCOUNTER — Encounter: Payer: Self-pay | Admitting: Family Medicine

## 2018-03-29 ENCOUNTER — Encounter: Payer: Self-pay | Admitting: Family Medicine

## 2018-03-30 MED ORDER — OXYCODONE-ACETAMINOPHEN 5-325 MG PO TABS: 1.0000 | ORAL_TABLET | Freq: Four times a day (QID) | ORAL | 0 refills | Status: DC | PRN

## 2018-04-14 ENCOUNTER — Encounter: Payer: Self-pay | Admitting: Family Medicine

## 2018-04-14 ENCOUNTER — Ambulatory Visit (INDEPENDENT_AMBULATORY_CARE_PROVIDER_SITE_OTHER): Payer: Self-pay | Admitting: Family Medicine

## 2018-04-14 VITALS — BP 144/90 | HR 95 | Temp 98.4°F

## 2018-04-14 DIAGNOSIS — Z794 Long term (current) use of insulin: Secondary | ICD-10-CM

## 2018-04-14 DIAGNOSIS — E119 Type 2 diabetes mellitus without complications: Secondary | ICD-10-CM

## 2018-04-14 DIAGNOSIS — G8929 Other chronic pain: Secondary | ICD-10-CM

## 2018-04-14 DIAGNOSIS — R030 Elevated blood-pressure reading, without diagnosis of hypertension: Secondary | ICD-10-CM

## 2018-04-14 DIAGNOSIS — F322 Major depressive disorder, single episode, severe without psychotic features: Secondary | ICD-10-CM

## 2018-04-14 DIAGNOSIS — M545 Low back pain: Secondary | ICD-10-CM

## 2018-04-14 MED ORDER — OXYCODONE-ACETAMINOPHEN 5-325 MG PO TABS: ORAL_TABLET | ORAL | 0 refills | Status: DC

## 2018-04-14 NOTE — Assessment & Plan Note (Signed)
No SI.  Continue Cymbalta.  Monitor for symptoms.

## 2018-04-14 NOTE — Assessment & Plan Note (Signed)
Improved on recheck.  Well-controlled at home.  She will monitor.  Suspect elevated related to pain.

## 2018-04-14 NOTE — Assessment & Plan Note (Addendum)
Chronic issue.  Appears to have significant pain today in the office.  Given her initial blood pressure I advised that there is nothing that we can give her in the office to treat this.  It sounds as though her pain is inadequately controlled with her current every 6 hours as needed regimen of Percocet.  We will increase the frequency with which she can take this to every 4-6 hours.  I will refer her to pain management at [facility] given that she has financial support there.  She will contact the surgeon today to follow-up on a plan for her.  I advised her that I could not answer her question regarding nerve impingement and permanent damage as I did not know the answer to her question.  Given return precautions.  Drug database was reviewed today.

## 2018-04-14 NOTE — Assessment & Plan Note (Signed)
Check A1c.  Continue current regimen. 

## 2018-04-14 NOTE — Progress Notes (Signed)
Tommi Rumps, MD Phone: [phone]  Laura Small is a 57 y.o. female who presents today for follow-up.  CC: Chronic pain, elevated blood pressure, diabetes, depression  Chronic pain: She has been taking Percocet 5-325 every 6 hours.  Occasionally has to take it every 4 hours if her pain is excessive.  She notes that the pain medication only lasts about 4 hours.  She takes Skelaxin 3 hours after her narcotics.  She does not drink alcohol.  She denies illicit drug use.  She noted while she was waiting in the waiting room her nerves started to fire from her back down into her legs.  She can trace a line down the back of her left leg with sharp shooting pain.  Feels like it is about to be cut in half.  This is a chronic issue that her and her brother both report occurs about once a day with the exact same symptoms.  She does feel crunching in her back and that has been chronic.  She did see a Psychologist, sport and exercise at [facility] who obtained an MRI which she reports there were going to review and determine what the next step in management would be.  They report she is financially covered at [facility].  She asks how long the nerve can be pinched before it causes permanent damage.  No bowel or bladder incontinence.  Elevated blood pressure: Typically around 630 systolically when she is not in pain.  She notes no chest pain or shortness of breath.  She is on medication.  Diabetes: CBGs 107-135 with excursions to 179.  She is taking metformin and basiglar.  No polyuria or polydipsia.  No hypoglycemia.  Depression: She notes she has not had any time to really think about that given her pain.  She notes no SI.  Social History   Tobacco Use  Smoking Status Former Smoker  . Packs/day: 0.00  . Types: Cigarettes  Smokeless Tobacco Never Used     ROS see history of present illness  Objective  Physical Exam Vitals:   04/14/18 1441 04/14/18 1458  BP: (!) 180/110 (!) 144/90  Pulse: 95   Temp: 98.4 F (36.9  C)   SpO2: 99%     BP Readings from Last 3 Encounters:  04/14/18 (!) 144/90  01/13/18 130/88  01/04/18 140/80   Wt Readings from Last 3 Encounters:  01/13/18 227 lb 3.2 oz (103.1 kg)  01/04/18 230 lb 3.2 oz (104.4 kg)  01/02/18 232 lb (105.2 kg)    Physical Exam  Constitutional: No distress.  Cardiovascular: Normal rate, regular rhythm and normal heart sounds.  Pulmonary/Chest: Effort normal and breath sounds normal.  Musculoskeletal: She exhibits no edema.  Appears visibly uncomfortable with intermittent tears, patient is in significant enough pain that she does not want her back examined and will not lean forward for a back examination, she declined strength exam stating that it would make her back pain worse, sensation light touch is intact in lower extremities  Neurological: She is alert.  Skin: Skin is warm and dry. She is not diaphoretic.     Assessment/Plan: Please see individual problem list.  Chronic low back pain Chronic issue.  Appears to have significant pain today in the office.  Given her initial blood pressure I advised that there is nothing that we can give her in the office to treat this.  It sounds as though her pain is inadequately controlled with her current every 6 hours as needed regimen of Percocet.  We  will increase the frequency with which she can take this to every 4-6 hours.  I will refer her to pain management at [facility] given that she has financial support there.  She will contact the surgeon today to follow-up on a plan for her.  I advised her that I could not answer her question regarding nerve impingement and permanent damage as I did not know the answer to her question.  Given return precautions.  Drug database was reviewed today.  DM (diabetes mellitus), type 2 (HCC) Check A1c.  Continue current regimen.  Elevated BP without diagnosis of hypertension Improved on recheck.  Well-controlled at home.  She will monitor.  Suspect elevated related to  pain.  Depression, major, single episode, severe (HCC) No SI.  Continue Cymbalta.  Monitor for symptoms.   Orders Placed This Encounter  Procedures  . HgB A1c  . Comp Met (CMET)  . Ambulatory referral to Pain Clinic    Referral Priority:   Routine    Referral Type:   Consultation    Referral Reason:   Specialty Services Required    Requested Specialty:   Pain Medicine    Number of Visits Requested:   1    Meds ordered this encounter  Medications  . oxyCODONE-acetaminophen (PERCOCET) 5-325 MG tablet    Sig: Take 1 tablet by mouth every 4-6 hours as needed for severe pain    Dispense:  120 tablet    Refill:  0     Tommi Rumps, MD [place]

## 2018-04-14 NOTE — Patient Instructions (Addendum)
Nice to see you. We will increase the frequency with which you can take your pain medication.  You may take this every 4-6 hours.  I would like to refer you to a pain specialist at [facility].  You need to contact the neurosurgeons office to see where they stand with regards to possible surgery. If your pain worsen she need to go to the emergency room.  We will get lab work today.

## 2018-04-15 LAB — COMPREHENSIVE METABOLIC PANEL
ALBUMIN: 4.3 g/dL (ref 3.5–5.2)
ALK PHOS: 97 U/L (ref 39–117)
ALT: 32 U/L (ref 0–35)
AST: 28 U/L (ref 0–37)
BUN: 7 mg/dL (ref 6–23)
CO2: 21 mEq/L (ref 19–32)
Calcium: 9.9 mg/dL (ref 8.4–10.5)
Chloride: 105 mEq/L (ref 96–112)
Creatinine, Ser: 0.66 mg/dL (ref 0.40–1.20)
GFR: 98.95 mL/min (ref >60.00)
Glucose, Bld: 126 mg/dL — ABNORMAL HIGH (ref 70–99)
POTASSIUM: 3.9 meq/L (ref 3.5–5.1)
Sodium: 137 mEq/L (ref 135–145)
TOTAL PROTEIN: 8 g/dL (ref 6.0–8.3)
Total Bilirubin: 0.5 mg/dL (ref 0.2–1.2)

## 2018-04-15 LAB — HEMOGLOBIN A1C: HEMOGLOBIN A1C: 6.8 % — AB (ref 4.6–6.5)

## 2018-04-19 ENCOUNTER — Telehealth: Payer: Self-pay | Admitting: Pharmacist

## 2018-04-19 NOTE — Telephone Encounter (Signed)
Office received fax from [employer] Patient Assistance program stating that there were missing materials from patient's application that had been submitted by [facility].  Contacted [facility], spoke to [place], who confirmed they had submitted an application for Basaglar patient assistance. Faxed the paperwork to [place] at [facility].   Catie Feliz Beamravis, PharmD PGY2 Ambulatory Care Pharmacy Resident Phone: [phone]

## 2018-04-20 ENCOUNTER — Encounter: Payer: Self-pay | Admitting: Family Medicine

## 2018-04-21 ENCOUNTER — Other Ambulatory Visit: Payer: Self-pay | Admitting: Family Medicine

## 2018-04-22 ENCOUNTER — Other Ambulatory Visit: Payer: Self-pay | Admitting: Family Medicine

## 2018-04-22 MED ORDER — OXYCODONE-ACETAMINOPHEN 5-325 MG PO TABS: ORAL_TABLET | ORAL | 0 refills | Status: DC

## 2018-05-06 ENCOUNTER — Encounter: Payer: Self-pay | Admitting: Family Medicine

## 2018-05-06 ENCOUNTER — Telehealth: Payer: Self-pay | Admitting: Family Medicine

## 2018-05-06 NOTE — Telephone Encounter (Signed)
Placed on Dr. Sonnenberg's desk  

## 2018-05-06 NOTE — Telephone Encounter (Signed)
Med management dropped off forms to be filled out. Placed in Dr. Sonnenberg's color folder up front. °Please call Annette at [phone] when completed  °

## 2018-05-07 MED ORDER — OXYCODONE-ACETAMINOPHEN 5-325 MG PO TABS: ORAL_TABLET | ORAL | 0 refills | Status: DC

## 2018-05-07 NOTE — Telephone Encounter (Signed)
Last OV 04/14/18 last filled 04/22/18 120 0rf

## 2018-05-07 NOTE — Telephone Encounter (Signed)
Signed. Please make available for pick up.  

## 2018-05-10 NOTE — Telephone Encounter (Signed)
Called and notified Laura Small at medication management that papers have been signed by Dr. Birdie SonsSonnenberg and are ready to be picked up.

## 2018-05-14 ENCOUNTER — Encounter: Payer: Self-pay | Admitting: Family Medicine

## 2018-06-04 ENCOUNTER — Encounter: Payer: Self-pay | Admitting: Family

## 2018-06-04 ENCOUNTER — Telehealth: Payer: Self-pay | Admitting: Family Medicine

## 2018-06-04 NOTE — Telephone Encounter (Signed)
Copied from CRM [id number]. Topic: Quick Communication - See Telephone Encounter >> Jun 04, 2018 11:31 AM Floria RavelingStovall, Shana A wrote: CRM for notification. See Telephone encounter for: 06/04/18. Sam with [facility] called in and stated they are not able to get the metaxalone (SKELAXIN) 800 MG tablet [[id number]][[id number]] in right now.  They are needing to know if pt can be sent something else in to bridge the gap until they can get the Skelaxin in?  She stated that pt was also asking what the max does was and said that pt maybe calling her to get a increase with this med.     Best number  [phone]

## 2018-06-04 NOTE — Telephone Encounter (Signed)
Sent mychart

## 2018-06-09 ENCOUNTER — Encounter: Payer: Self-pay | Admitting: Emergency Medicine

## 2018-06-09 ENCOUNTER — Other Ambulatory Visit: Payer: Self-pay

## 2018-06-09 ENCOUNTER — Observation Stay
Admission: EM | Admit: 2018-06-09 | Discharge: 2018-06-11 | Disposition: A | Discharge status: Home / Self Care | Payer: Self-pay | Attending: Internal Medicine | Admitting: Internal Medicine

## 2018-06-09 DIAGNOSIS — Z888 Allergy status to other drugs, medicaments and biological substances status: Secondary | ICD-10-CM | POA: Insufficient documentation

## 2018-06-09 DIAGNOSIS — R29898 Other symptoms and signs involving the musculoskeletal system: Secondary | ICD-10-CM | POA: Diagnosis present

## 2018-06-09 DIAGNOSIS — R740 Nonspecific elevation of levels of transaminase and lactic acid dehydrogenase [LDH]: Secondary | ICD-10-CM | POA: Insufficient documentation

## 2018-06-09 DIAGNOSIS — M5117 Intervertebral disc disorders with radiculopathy, lumbosacral region: Secondary | ICD-10-CM | POA: Insufficient documentation

## 2018-06-09 DIAGNOSIS — E876 Hypokalemia: Secondary | ICD-10-CM | POA: Insufficient documentation

## 2018-06-09 DIAGNOSIS — M5116 Intervertebral disc disorders with radiculopathy, lumbar region: Secondary | ICD-10-CM | POA: Insufficient documentation

## 2018-06-09 DIAGNOSIS — Z91030 Bee allergy status: Secondary | ICD-10-CM | POA: Insufficient documentation

## 2018-06-09 DIAGNOSIS — Z87891 Personal history of nicotine dependence: Secondary | ICD-10-CM | POA: Insufficient documentation

## 2018-06-09 DIAGNOSIS — M48061 Spinal stenosis, lumbar region without neurogenic claudication: Secondary | ICD-10-CM | POA: Insufficient documentation

## 2018-06-09 DIAGNOSIS — R531 Weakness: Secondary | ICD-10-CM | POA: Insufficient documentation

## 2018-06-09 DIAGNOSIS — M2578 Osteophyte, vertebrae: Secondary | ICD-10-CM | POA: Insufficient documentation

## 2018-06-09 DIAGNOSIS — Z886 Allergy status to analgesic agent status: Secondary | ICD-10-CM | POA: Insufficient documentation

## 2018-06-09 DIAGNOSIS — Z79899 Other long term (current) drug therapy: Secondary | ICD-10-CM | POA: Insufficient documentation

## 2018-06-09 DIAGNOSIS — Z8249 Family history of ischemic heart disease and other diseases of the circulatory system: Secondary | ICD-10-CM | POA: Insufficient documentation

## 2018-06-09 DIAGNOSIS — F329 Major depressive disorder, single episode, unspecified: Secondary | ICD-10-CM | POA: Insufficient documentation

## 2018-06-09 DIAGNOSIS — I700 Atherosclerosis of aorta: Secondary | ICD-10-CM | POA: Insufficient documentation

## 2018-06-09 DIAGNOSIS — M4726 Other spondylosis with radiculopathy, lumbar region: Secondary | ICD-10-CM | POA: Insufficient documentation

## 2018-06-09 DIAGNOSIS — Z794 Long term (current) use of insulin: Secondary | ICD-10-CM | POA: Insufficient documentation

## 2018-06-09 DIAGNOSIS — G8929 Other chronic pain: Principal | ICD-10-CM | POA: Insufficient documentation

## 2018-06-09 DIAGNOSIS — M5440 Lumbago with sciatica, unspecified side: Secondary | ICD-10-CM

## 2018-06-09 DIAGNOSIS — Z7982 Long term (current) use of aspirin: Secondary | ICD-10-CM | POA: Insufficient documentation

## 2018-06-09 DIAGNOSIS — E119 Type 2 diabetes mellitus without complications: Secondary | ICD-10-CM | POA: Insufficient documentation

## 2018-06-09 MED ORDER — GABAPENTIN 300 MG PO CAPS
900.0000 mg | ORAL_CAPSULE | Freq: Once | ORAL | Status: AC
  Administered 2018-06-09: 900 mg via ORAL

## 2018-06-09 MED ORDER — GABAPENTIN 100 MG PO CAPS: 100.0000 mg | ORAL_CAPSULE | Freq: Once | ORAL | Status: DC

## 2018-06-09 NOTE — ED Triage Notes (Signed)
Pt comes into the ED via POV c/o lower back pain that radiates into both hips.  Patient states this has been an ongoing problem since 2017 where she was diagnosed with spinal stenosis, nerve compression, and a slipped disc.  Patient states that as of Saturday, she has lost all feeling in her left leg and is non-weight bearing.  Patient presents tearful in triage at this time.  Patient denies any new injury to the back or leg.

## 2018-06-09 NOTE — ED Provider Notes (Addendum)
[facility] Emergency Department Provider Note  ___________________________________________   First MD Initiated Contact with Patient 06/09/18 2155     (approximate)  I have reviewed the triage vital signs and the nursing notes.   HISTORY  Chief Complaint Back Pain and Hip Pain   HPI Laura Small is a 57 y.o. female history of chronic back pain over the past 2 years was presenting with worsening lower back pain across lower back and left lower extremity paralysis over the past 5 days.  She says that this past Saturday she stopped being able to move her left lower extremity and says that she has been crawling around the floor at home as well as having to sleep on her right side.  She says that she has had a recent MRI this July with multiple positive findings.  Says that she is also had insurance issues and has spoken with multiple spinal surgeons who are "not willing to do anything."   Past Medical History:  Diagnosis Date  . Back pain   . Chicken pox   . Diabetes mellitus without complication (HCC)   . Migraines   . PONV (postoperative nausea and vomiting)    usually has 1 episode of vomiting within 1 hour of anesthesia     Patient Active Problem List   Diagnosis Date Noted  . Elevated BP without diagnosis of hypertension 04/14/2018  . Depression, major, single episode, severe (HCC) 10/12/2017  . Muscle spasms of both lower extremities 10/12/2017  . Aortic atherosclerosis (HCC) 01/08/2017  . Tobacco abuse 01/08/2017  . Ileitis 01/08/2017  . DM (diabetes mellitus), type 2 (HCC) 09/23/2016  . Hyperlipidemia 09/23/2016  . Chronic low back pain 09/08/2016    Past Surgical History:  Procedure Laterality Date  . BACK SURGERY    . CHOLECYSTECTOMY    . INSERTION OF MESH N/A 02/12/2015   Procedure: INSERTION OF MESH;  Surgeon: Natale LayMark Bird, MD;  Location: [facility] ORS;  Service: General;  Laterality: N/A;  . VENTRAL HERNIA REPAIR N/A 02/12/2015   Procedure: HERNIA REPAIR VENTRAL ADULT;  Surgeon: Natale LayMark Bird, MD;  Location: [facility] ORS;  Service: General;  Laterality: N/A;    Prior to Admission medications   Medication Sig Start Date End Date Taking? Authorizing Provider  aspirin EC 81 MG tablet Take 1 tablet (81 mg total) by mouth daily. 10/15/17   Pucilowska, Braulio ConteJolanta B, MD  DULoxetine (CYMBALTA) 60 MG capsule Take 1 capsule (60 mg total) by mouth daily. 10/16/17   Pucilowska, Braulio ConteJolanta B, MD  Insulin Glargine (BASAGLAR KWIKPEN) 100 UNIT/ML SOPN Inject 0.4 mLs (40 Units total) into the skin every morning. Increase per instructions. 10/15/17   Pucilowska, Braulio ConteJolanta B, MD  Insulin Pen Needle 32G X 6 MM MISC Use as directed for once daily insulin. E11.9 10/15/17   Pucilowska, Braulio ConteJolanta B, MD  metaxalone (SKELAXIN) 800 MG tablet Take 800 mg by mouth 4 (four) times daily.    [provider]  metFORMIN (GLUCOPHAGE XR) 500 MG 24 hr tablet Take 1 tablet (500 mg total) by mouth 2 (two) times daily. Patient taking differently: Take 500 mg by mouth daily with breakfast.  10/15/17   Pucilowska, Jolanta B, MD  oxyCODONE-acetaminophen (PERCOCET) 5-325 MG tablet Take 1 tablet by mouth every 4-6 hours as needed for severe pain 05/12/18   Glori LuisSonnenberg, Eric G, MD    Allergies Bee venom; Advil [ibuprofen]; Jardiance [empagliflozin]; Nitroglycerin; and Prednisone  Family History  Problem Relation Age of Onset  . Diabetes Mother   .  Gallbladder disease Mother   . Heart disease Father   . Alcohol abuse Father   . Gallbladder disease Father   . Heart disease Paternal Grandmother   . Colon cancer Neg Hx     Social History Social History   Tobacco Use  . Smoking status: Former Smoker    Packs/day: 0.00    Types: Cigarettes  . Smokeless tobacco: Never Used  Substance Use Topics  . Alcohol use: No  . Drug use: No    Review of Systems  Constitutional: No fever/chills Eyes: No visual changes. ENT: No sore throat. Cardiovascular: Denies chest  pain. Respiratory: Denies shortness of breath. Gastrointestinal: No abdominal pain.  No nausea, no vomiting.  No diarrhea.  No constipation. Genitourinary: Negative for dysuria. Musculoskeletal: Negative for back pain. Skin: Negative for rash. Neurological: Negative for headaches, focal weakness or numbness.   ____________________________________________   PHYSICAL EXAM:  VITAL SIGNS: ED Triage Vitals 06/09/18 2153  Enc Vitals Group     BP (!) 148/109     Pulse Rate (!) 119     Resp (!) 22     Temp 98.2 F (36.8 C)     Temp Source Oral     SpO2 98 %     Weight 232 lb (105.2 kg)     Height 5\' 7"  (1.702 m)     Head Circumference      Peak Flow      Pain Score 10     Pain Loc      Pain Edu?      Excl. in GC?     Constitutional: Alert and oriented. Well appearing and in no acute distress.  Sitting upright in the stretcher. Eyes: Conjunctivae are normal.  Head: Atraumatic. Nose: No congestion/rhinnorhea. Mouth/Throat: Mucous membranes are moist.  Neck: No stridor.   Cardiovascular: Normal rate, regular rhythm. Grossly normal heart sounds.  Respiratory: Normal respiratory effort.  No retractions. Lungs CTAB. Gastrointestinal: Soft and nontender. No distention.  Musculoskeletal: No lower extremity tenderness nor edema.  No joint effusions. Patient with tenderness to palpation across the lower lumbar region including just to light touch.  No midline step-off or deformity.    Patient says that she is insensate to light touch left lower extremity.  Painful stimuli to the nailbed does not elicit a response.  Patient was 0 out of 5 active strength to the left lower extremity starting at the hip joint.  Normal 5 out of 5 strength of the right lower extremity with sensation that is intact light touch.  Neurologic:  Normal speech and language. No gross focal neurologic deficits are appreciated. Skin:  Skin is warm, dry and intact. No rash noted. Psychiatric: Mood and affect are  normal. Speech and behavior are normal.  ____________________________________________   LABS (all labs ordered are listed, but only abnormal results are displayed)  Labs Reviewed - No data to display ____________________________________________  EKG   ____________________________________________  RADIOLOGY  Pending MRI. ____________________________________________   PROCEDURES  Procedure(s) performed:   Procedures  Critical Care performed:   ____________________________________________   INITIAL IMPRESSION / ASSESSMENT AND PLAN / ED COURSE  Pertinent labs & imaging results that were available during my care of the patient were reviewed by me and considered in my medical decision making (see chart for details).  DDX: Chronic back pain, cord compression, cauda equina, sciatica/lumbar radiculopathy. As part of my medical decision making, I reviewed the following data within the electronic MEDICAL RECORD NUMBER Notes from prior ED visits  -----------------------------------------  11:23 PM on 06/09/2018 -----------------------------------------  Patient with multiple MRIs in the past and multiple ER visits but I do not see acute complaints previously of paralysis.  Patient will require advanced imaging with an MRI.  Patient given gabapentin for pain.  Signed out to Dr. Lamont Snowball. ____________________________________________   FINAL CLINICAL IMPRESSION(S) / ED DIAGNOSES  Chronic back pain.  Left lower extremity paralysis.  NEW MEDICATIONS STARTED DURING THIS VISIT:  New Prescriptions   No medications on file     Note:  This document was prepared using Dragon voice recognition software and may include unintentional dictation errors.     Myrna Blazer, MD 06/09/18 2324    Myrna Blazer, MD 06/09/18 2325

## 2018-06-09 NOTE — ED Notes (Signed)
Waiting for pain relief for MRI to be done.

## 2018-06-10 ENCOUNTER — Observation Stay: Payer: Self-pay

## 2018-06-10 ENCOUNTER — Emergency Department: Payer: Self-pay

## 2018-06-10 DIAGNOSIS — R29898 Other symptoms and signs involving the musculoskeletal system: Secondary | ICD-10-CM | POA: Diagnosis present

## 2018-06-10 LAB — CBC WITH DIFFERENTIAL/PLATELET
BASOS ABS: 0.1 10*3/uL (ref 0–0.1)
BASOS PCT: 1 %
EOS ABS: 0 10*3/uL (ref 0–0.7)
EOS PCT: 0 %
HCT: 43.6 % (ref 35.0–47.0)
Hemoglobin: 15.3 g/dL (ref 12.0–16.0)
Lymphocytes Relative: 33 %
Lymphs Abs: 3.7 10*3/uL — ABNORMAL HIGH (ref 1.0–3.6)
MCH: 30.9 pg (ref 26.0–34.0)
MCHC: 35 g/dL (ref 32.0–36.0)
MCV: 88.1 fL (ref 80.0–100.0)
MONO ABS: 0.9 10*3/uL (ref 0.2–0.9)
Monocytes Relative: 8 %
Neutro Abs: 6.3 10*3/uL (ref 1.4–6.5)
Neutrophils Relative %: 58 %
PLATELETS: 314 10*3/uL (ref 150–440)
RBC: 4.95 MIL/uL (ref 3.80–5.20)
RDW: 13.6 % (ref 11.5–14.5)
WBC: 11.1 10*3/uL — AB (ref 3.6–11.0)

## 2018-06-10 LAB — TSH
TSH: 3.06 u[IU]/mL (ref 0.350–4.500)
TSH: 3.903 u[IU]/mL (ref 0.350–4.500)

## 2018-06-10 LAB — SEDIMENTATION RATE: SED RATE: 36 mm/h — AB (ref 0–30)

## 2018-06-10 LAB — GLUCOSE, CAPILLARY
Glucose-Capillary: 120 mg/dL — ABNORMAL HIGH (ref 70–99)
Glucose-Capillary: 187 mg/dL — ABNORMAL HIGH (ref 70–99)
Glucose-Capillary: 186 mg/dL — ABNORMAL HIGH (ref 70–99)
Glucose-Capillary: 159 mg/dL — ABNORMAL HIGH (ref 70–99)
Glucose-Capillary: 264 mg/dL — ABNORMAL HIGH (ref 70–99)

## 2018-06-10 LAB — COMPREHENSIVE METABOLIC PANEL
ALK PHOS: 100 U/L (ref 38–126)
ALT: 64 U/L — ABNORMAL HIGH (ref 0–44)
AST: 43 U/L — ABNORMAL HIGH (ref 15–41)
Albumin: 4.1 g/dL (ref 3.5–5.0)
Anion gap: 12 (ref 5–15)
BILIRUBIN TOTAL: 0.8 mg/dL (ref 0.3–1.2)
BUN: 11 mg/dL (ref 6–20)
CALCIUM: 9.2 mg/dL (ref 8.9–10.3)
CO2: 19 mmol/L — ABNORMAL LOW (ref 22–32)
CREATININE: 0.63 mg/dL (ref 0.44–1.00)
Chloride: 106 mmol/L (ref 98–111)
GFR calc Af Amer: >60 mL/min (ref >60)
Glucose, Bld: 144 mg/dL — ABNORMAL HIGH (ref 70–99)
Potassium: 3.3 mmol/L — ABNORMAL LOW (ref 3.5–5.1)
Sodium: 137 mmol/L (ref 135–145)
TOTAL PROTEIN: 7.5 g/dL (ref 6.5–8.1)

## 2018-06-10 LAB — C-REACTIVE PROTEIN: CRP: 1.1 mg/dL — AB (ref <1.0)

## 2018-06-10 MED ORDER — METHYLPREDNISOLONE SODIUM SUCC 40 MG IJ SOLR
40.0000 mg | Freq: Once | INTRAMUSCULAR | Status: AC
  Administered 2018-06-10: 16:00:00 40 mg via INTRAVENOUS
  Filled 2018-06-10: qty 1

## 2018-06-10 MED ORDER — INSULIN ASPART 100 UNIT/ML ~~LOC~~ SOLN: 0.0000 [IU] | Freq: Every day | SUBCUTANEOUS | Status: AC

## 2018-06-10 MED ORDER — INSULIN GLARGINE 100 UNIT/ML ~~LOC~~ SOLN
24.0000 [IU] | Freq: Every day | SUBCUTANEOUS | Status: DC
  Filled 2018-06-10: qty 0.24

## 2018-06-10 MED ORDER — POTASSIUM CHLORIDE CRYS ER 20 MEQ PO TBCR
40.0000 meq | EXTENDED_RELEASE_TABLET | Freq: Once | ORAL | Status: AC
  Administered 2018-06-10: 40 meq via ORAL
  Filled 2018-06-10: qty 2

## 2018-06-10 MED ORDER — ENOXAPARIN SODIUM 40 MG/0.4ML ~~LOC~~ SOLN
40.0000 mg | SUBCUTANEOUS | Status: DC
  Filled 2018-06-10: qty 0.4

## 2018-06-10 MED ORDER — DULOXETINE HCL 30 MG PO CPEP
60.0000 mg | ORAL_CAPSULE | Freq: Every day | ORAL | Status: DC
  Filled 2018-06-10 (×2): qty 2

## 2018-06-10 MED ORDER — ASPIRIN EC 81 MG PO TBEC
81.0000 mg | DELAYED_RELEASE_TABLET | Freq: Every day | ORAL | Status: DC
  Administered 2018-06-10: 81 mg via ORAL
  Filled 2018-06-10 (×2): qty 1

## 2018-06-10 MED ORDER — MORPHINE SULFATE (PF) 4 MG/ML IV SOLN
8.0000 mg | Freq: Once | INTRAVENOUS | Status: AC
  Administered 2018-06-10: 8 mg via INTRAVENOUS
  Filled 2018-06-10: qty 2

## 2018-06-10 MED ORDER — HALOPERIDOL LACTATE 5 MG/ML IJ SOLN
5.0000 mg | Freq: Once | INTRAMUSCULAR | Status: AC
  Administered 2018-06-10: 5 mg via INTRAVENOUS
  Filled 2018-06-10: qty 1

## 2018-06-10 MED ORDER — ACETAMINOPHEN 325 MG PO TABS
650.0000 mg | ORAL_TABLET | Freq: Four times a day (QID) | ORAL | Status: DC | PRN
  Administered 2018-06-10 – 2018-06-11 (×2): 650 mg via ORAL
  Filled 2018-06-10 (×2): qty 2

## 2018-06-10 MED ORDER — ONDANSETRON HCL 4 MG PO TABS: 4.0000 mg | ORAL_TABLET | Freq: Four times a day (QID) | ORAL | Status: DC | PRN

## 2018-06-10 MED ORDER — INSULIN GLARGINE 100 UNIT/ML ~~LOC~~ SOLN
24.0000 [IU] | Freq: Every day | SUBCUTANEOUS | Status: DC
  Administered 2018-06-10: 24 [IU] via SUBCUTANEOUS
  Filled 2018-06-10 (×2): qty 0.24

## 2018-06-10 MED ORDER — DOCUSATE SODIUM 100 MG PO CAPS
100.0000 mg | ORAL_CAPSULE | Freq: Two times a day (BID) | ORAL | Status: DC
  Filled 2018-06-10 (×3): qty 1

## 2018-06-10 MED ORDER — ACETAMINOPHEN 650 MG RE SUPP
650.0000 mg | Freq: Four times a day (QID) | RECTAL | Status: DC | PRN
  Filled 2018-06-10: qty 1

## 2018-06-10 MED ORDER — INSULIN GLARGINE 100 UNIT/ML ~~LOC~~ SOLN
40.0000 [IU] | Freq: Every day | SUBCUTANEOUS | Status: DC
  Filled 2018-06-10: qty 0.4

## 2018-06-10 MED ORDER — INSULIN ASPART 100 UNIT/ML ~~LOC~~ SOLN
0.0000 [IU] | Freq: Three times a day (TID) | SUBCUTANEOUS | Status: DC
  Administered 2018-06-10 (×3): 2 [IU] via SUBCUTANEOUS
  Administered 2018-06-11: 1 [IU] via SUBCUTANEOUS
  Filled 2018-06-10 (×4): qty 1

## 2018-06-10 MED ORDER — GABAPENTIN 300 MG PO CAPS
ORAL_CAPSULE | ORAL | Status: AC
  Filled 2018-06-10: qty 3

## 2018-06-10 MED ORDER — GADOBENATE DIMEGLUMINE 529 MG/ML IV SOLN
20.0000 mL | Freq: Once | INTRAVENOUS | Status: AC | PRN
  Administered 2018-06-10: 20 mL via INTRAVENOUS

## 2018-06-10 MED ORDER — ONDANSETRON HCL 4 MG/2ML IJ SOLN: 4.0000 mg | Freq: Four times a day (QID) | INTRAMUSCULAR | Status: DC | PRN

## 2018-06-10 MED ORDER — OXYCODONE-ACETAMINOPHEN 5-325 MG PO TABS
1.0000 | ORAL_TABLET | Freq: Four times a day (QID) | ORAL | Status: DC | PRN
  Administered 2018-06-11: 1 via ORAL
  Filled 2018-06-10: qty 1

## 2018-06-10 NOTE — ED Notes (Signed)
Family at bedside. 

## 2018-06-10 NOTE — Progress Notes (Signed)
Physical Therapy Evaluation Patient Details Name: Laura Small MRN: [id number] DOB: 07/15/1961 Today's Date: 06/10/2018   History of Present Illness  Pt admitted to hospital for LE weakness attributed to chonic LBP with sciatica. Pt has a PMH of LBP, DM, and migraines.  Clinical Impression  Pt is a pleasant 57 year old female who was admitted for LE weakness. Per pt, drastic decline the past few days, 5 days ago could get around home without difficulty, now she states "I have to drag my leg around" for all mobility. Pt performs bed mobility with Mod I, and  transfers and ambulation with CGA. Amb is very short distances. Pt demonstrates deficits with LLE sensation, BLE weakness (L>R), balance, endurance, and mobility. Would benefit from skilled PT to address above deficits and promote optimal return to PLOF. Pt is not at her baseline status recommend SNF for further therapy. This entire session was guided, instructed, and directly supervised by Elizabeth PalauStephanie Ray, DPT.       Follow Up Recommendations SNF    Equipment Recommendations  Other (comment)(Bedside commode)    Recommendations for Other Services       Precautions / Restrictions Precautions Precautions: None Restrictions Weight Bearing Restrictions: No      Mobility  Bed Mobility Overal bed mobility: Modified Independent             General bed mobility comments: Pt uses BUE to assist LLE to edge of bed, has fair control of RLE, demonstrates good trunk control t/o bed mobility.  Transfers Overall transfer level: Needs assistance Equipment used: Rolling walker (2 wheeled) Transfers: Sit to/from Stand Sit to Stand: Min guard         General transfer comment: Pt uses RW and bed for UE support to rise. In standing pt appears to equally distribute weight between the R and L LEs, still using UE for support. Pt states she feels unsteady unless my L knee is locked.   Ambulation/Gait Ambulation/Gait assistance:  Min guard Gait Distance (Feet): 2 Feet Assistive device: Rolling walker (2 wheeled) Gait Pattern/deviations: Decreased stance time - left     General Gait Details: Pt performed pre-gait activity marching in place. Demonstrates ability to WB in SLS, LLE only tolerates for very short duration. Pt also performs side steps toward L side, continues to have dec stance time on LLE. Pt required inc time and full effort.  Stairs            Wheelchair Mobility    Modified Rankin (Stroke Patients Only)       Balance Overall balance assessment: Needs assistance Sitting-balance support: Feet supported Sitting balance-Leahy Scale: Fair Sitting balance - Comments: Pt has good trunk control.   Standing balance support: Bilateral upper extremity supported Standing balance-Leahy Scale: Poor Standing balance comment: Pt requires RW for UE support to maintain balance.                             Pertinent Vitals/Pain Pain Assessment: Faces Faces Pain Scale: Hurts even more Pain Location: Low back Pain Descriptors / Indicators: Sharp;Grimacing;Guarding Pain Intervention(s): Limited activity within patient's tolerance;Monitored during session    Home Living Family/patient expects to be discharged to:: Private residence Living Arrangements: Spouse/significant other;Other relatives(Brother) Available Help at Discharge: Family;Available 24 hours/day Type of Home: House Home Access: Stairs to enter Entrance Stairs-Rails: None Entrance Stairs-Number of Steps: 4 Home Layout: Two level;Bed/bath upstairs Home Equipment: (P) Walker - 4 wheels;Cane - single point;Wheelchair -  manual;Grab bars - tub/shower;Walker - standard      Prior Function Level of Independence: Independent with assistive device(s)         Comments: Mod indep with limited household mobility with RW, self-propelled w/c used for shorter community distances. Max assist with w/c propulsion for longer community  distances.     Hand Dominance        Extremity/Trunk Assessment   Upper Extremity Assessment Upper Extremity Assessment: Overall WFL for tasks assessed    Lower Extremity Assessment Lower Extremity Assessment: RLE deficits/detail;LLE deficits/detail RLE Deficits / Details: Generalized weakness 3-/5 RLE Sensation: WNL LLE Deficits / Details: Generalized weakness  2-/5 LLE Sensation: decreased light touch       Communication   Communication: No difficulties  Cognition Arousal/Alertness: Awake/alert Behavior During Therapy: WFL for tasks assessed/performed Overall Cognitive Status: Within Functional Limits for tasks assessed                                        General Comments      Exercises Other Exercises Other Exercises: HEP given to pt including ankle pumps, quad sets, and SLRs to improve BLE strength. Pt was educated on frequency and duration of HEP, all was written on her room's white board.   Assessment/Plan    PT Assessment Patient needs continued PT services  PT Problem List Decreased strength;Decreased activity tolerance;Decreased balance;Decreased mobility;Decreased coordination;Impaired sensation;Pain       PT Treatment Interventions DME instruction;Gait training;Stair training;Functional mobility training;Therapeutic activities;Therapeutic exercise;Balance training;Neuromuscular re-education;Patient/family education;Wheelchair mobility training    PT Goals (Current goals can be found in the Care Plan section)  Acute Rehab PT Goals Patient Stated Goal: Wants to go home PT Goal Formulation: With patient Time For Goal Achievement: 06/24/18 Potential to Achieve Goals: Good    Frequency Min 2X/week   Barriers to discharge Inaccessible home environment Pt has many stairs to get into and mobilize in her home.    Co-evaluation               AM-PAC PT "6 Clicks" Daily Activity  Outcome Measure Difficulty turning over in bed  (including adjusting bedclothes, sheets and blankets)?: None Difficulty moving from lying on back to sitting on the side of the bed? : A Little Difficulty sitting down on and standing up from a chair with arms (e.g., wheelchair, bedside commode, etc,.)?: Unable Help needed moving to and from a bed to chair (including a wheelchair)?: A Lot Help needed walking in hospital room?: Total Help needed climbing 3-5 steps with a railing? : Total 6 Click Score: 12    End of Session Equipment Utilized During Treatment: Gait belt Activity Tolerance: Patient limited by fatigue Patient left: in bed;with call bell/phone within reach;with bed alarm set;with family/visitor present Nurse Communication: Mobility status PT Visit Diagnosis: Unsteadiness on feet (R26.81);Other abnormalities of gait and mobility (R26.89);Muscle weakness (generalized) (M62.81);Other symptoms and signs involving the nervous system (R29.898)    Time: 1610-9604 PT Time Calculation (min) (ACUTE ONLY): 25 min   Charges:   PT Evaluation $PT Eval Low Complexity: 1 Low          Arvilla Meres, SPT   Arvilla Meres 06/10/2018, 5:38 PM

## 2018-06-10 NOTE — H&P (Signed)
Laura Small is an 57 y.o. female.   Chief Complaint: Back pain HPI: Patient with past medical history of chronic back pain as well as diabetes presents to the emergency department complaining of weakness in her legs as well as pain in her lower back.  The patient states the pain radiates into her hips bilaterally.  This is been a ongoing problem for the last 22 months according to the patient's brother.  The patient has had multiple imaging studies showing no acute problems with her back.  However with this encounter the patient reports that her left leg went numb and weak 5 days ago.  She has essentially been dragging the leg since that time.  MRI of the patient's spine did not show any acute abnormalities.  However the patient's physical exam shows a paresis of the left lower extremity below the knee which prompted the emergency department staff to call the hospitalist service for admission.  Past Medical History:  Diagnosis Date  . Back pain   . Chicken pox   . Diabetes mellitus without complication ([place])   . Migraines   . PONV (postoperative nausea and vomiting)    usually has 1 episode of vomiting within 1 hour of anesthesia     Past Surgical History:  Procedure Laterality Date  . BACK SURGERY    . CHOLECYSTECTOMY    . INSERTION OF MESH N/A 02/12/2015   Procedure: INSERTION OF MESH;  Surgeon: Sherri Rad, MD;  Location: [facility] ORS;  Service: General;  Laterality: N/A;  . VENTRAL HERNIA REPAIR N/A 02/12/2015   Procedure: HERNIA REPAIR VENTRAL ADULT;  Surgeon: Sherri Rad, MD;  Location: [facility] ORS;  Service: General;  Laterality: N/A;    Family History  Problem Relation Age of Onset  . Diabetes Mother   . Gallbladder disease Mother   . Heart disease Father   . Alcohol abuse Father   . Gallbladder disease Father   . Heart disease Paternal Grandmother   . Colon cancer Neg Hx    Social History:  reports that she has quit smoking. Her smoking use included cigarettes. She smoked  0.00 packs per day. She has never used smokeless tobacco. She reports that she does not drink alcohol or use drugs.  Allergies:  Allergies  Allergen Reactions  . Bee Venom Anaphylaxis and Swelling  . Advil [Ibuprofen] Swelling  . Jardiance [Empagliflozin] Rash  . Nitroglycerin Other (See Comments)    Pt states "causes me to feel off and weird all over and pressure in hands"  . Prednisone Swelling    Medications Prior to Admission  Medication Sig Dispense Refill  . aspirin EC 81 MG tablet Take 1 tablet (81 mg total) by mouth daily. 30 tablet 1  . DULoxetine (CYMBALTA) 60 MG capsule Take 1 capsule (60 mg total) by mouth daily. 30 capsule 1  . Insulin Glargine (BASAGLAR KWIKPEN) 100 UNIT/ML SOPN Inject 0.4 mLs (40 Units total) into the skin every morning. Increase per instructions. 5 pen 6  . Insulin Pen Needle 32G X 6 MM MISC Use as directed for once daily insulin. E11.9 100 each 3  . metaxalone (SKELAXIN) 800 MG tablet Take 800 mg by mouth 4 (four) times daily.    . metFORMIN (GLUCOPHAGE XR) 500 MG 24 hr tablet Take 1 tablet (500 mg total) by mouth 2 (two) times daily. (Patient taking differently: Take 500 mg by mouth daily with breakfast. ) 60 tablet 3  . oxyCODONE-acetaminophen (PERCOCET) 5-325 MG tablet Take 1 tablet by  mouth every 4-6 hours as needed for severe pain 120 tablet 0    Results for orders placed or performed during the hospital encounter of 06/09/18 (from the past 48 hour(s))  Comprehensive metabolic panel     Status: Abnormal   Collection Time: 06/10/18  2:13 AM  Result Value Ref Range   Sodium 137 135 - 145 mmol/L   Potassium 3.3 (L) 3.5 - 5.1 mmol/L   Chloride 106 98 - 111 mmol/L   CO2 19 (L) 22 - 32 mmol/L   Glucose, Bld 144 (H) 70 - 99 mg/dL   BUN 11 6 - 20 mg/dL   Creatinine, Ser 0.63 0.44 - 1.00 mg/dL   Calcium 9.2 8.9 - 10.3 mg/dL   Total Protein 7.5 6.5 - 8.1 g/dL   Albumin 4.1 3.5 - 5.0 g/dL   AST 43 (H) 15 - 41 U/L   ALT 64 (H) 0 - 44 U/L   Alkaline  Phosphatase 100 38 - 126 U/L   Total Bilirubin 0.8 0.3 - 1.2 mg/dL   GFR calc non Af Amer >60 >60 mL/min   GFR calc Af Amer >60 >60 mL/min    Comment: (NOTE) The eGFR has been calculated using the CKD EPI equation. This calculation has not been validated in all clinical situations. eGFR's persistently <60 mL/min signify possible Chronic Kidney Disease.    Anion gap 12 5 - 15    Comment: Performed at [facility], [address] [place], [place] [address]  CBC with Differential     Status: Abnormal   Collection Time: 06/10/18  2:13 AM  Result Value Ref Range   WBC 11.1 (H) 3.6 - 11.0 K/uL   RBC 4.95 3.80 - 5.20 MIL/uL   Hemoglobin 15.3 12.0 - 16.0 g/dL   HCT 43.6 35.0 - 47.0 %   MCV 88.1 80.0 - 100.0 fL   MCH 30.9 26.0 - 34.0 pg   MCHC 35.0 32.0 - 36.0 g/dL   RDW 13.6 11.5 - 14.5 %   Platelets 314 150 - 440 K/uL   Neutrophils Relative % 58 %   Neutro Abs 6.3 1.4 - 6.5 K/uL   Lymphocytes Relative 33 %   Lymphs Abs 3.7 (H) 1.0 - 3.6 K/uL   Monocytes Relative 8 %   Monocytes Absolute 0.9 0.2 - 0.9 K/uL   Eosinophils Relative 0 %   Eosinophils Absolute 0.0 0 - 0.7 K/uL   Basophils Relative 1 %   Basophils Absolute 0.1 0 - 0.1 K/uL    Comment: Performed at [facility], [address] [place], [place] [address]  TSH     Status: None   Collection Time: 06/10/18  2:13 AM  Result Value Ref Range   TSH 3.060 0.350 - 4.500 uIU/mL    Comment: Performed by a 3rd Generation assay with a functional sensitivity of <=0.01 uIU/mL. Performed at [facility], [address] [place], [place] [address]   Glucose, capillary     Status: Abnormal   Collection Time: 06/10/18  4:01 AM  Result Value Ref Range   Glucose-Capillary 120 (H) 70 - 99 mg/dL  Glucose, capillary     Status: Abnormal   Collection Time: 06/10/18  7:26 AM  Result Value Ref Range   Glucose-Capillary 187 (H) 70 - 99 mg/dL   Mr Lumbar Spine Wo Contrast  Result Date: 06/10/2018 CLINICAL  DATA:  Initial evaluation for worsening chronic lower back pain with left lower extremity paralysis. EXAM: MRI LUMBAR SPINE WITHOUT CONTRAST  TECHNIQUE: Multiplanar, multisequence MR imaging of the lumbar spine was performed. No intravenous contrast was administered. COMPARISON:  Prior CT from 11/09/2017. FINDINGS: Segmentation: Normal segmentation. Lowest well-formed disc labeled the L5-S1 level. Alignment: Vertebral bodies normally aligned with preservation of the normal lumbar lordosis. No listhesis. Vertebrae: Vertebral body heights well maintained without evidence for acute or chronic fracture. Bone marrow signal intensity within normal limits. No discrete or worrisome osseous lesions. Chronic reactive endplate changes present about the L5-S1 interspace. Conus medullaris and cauda equina: Conus extends to the L2 level. Conus and cauda equina appear normal. Paraspinal and other soft tissues: Paraspinous soft tissues within normal limits. Visualized visceral structures are normal. Disc levels: L1-2:  Unremarkable. L2-3:  Mild annular disc bulge.  No canal or foraminal stenosis. L3-4: Mild diffuse disc bulge with disc desiccation. Small central annular fissure noted. Mild facet hypertrophy. No canal or foraminal stenosis. L4-5: Mild annular disc bulge with disc desiccation. Mild to moderate facet and ligament flavum hypertrophy. Borderline mild bilateral lateral recess narrowing, slightly greater on the left. Foramina remain patent. No impingement. L5-S1: Chronic intervertebral disc space narrowing with diffuse disc bulge and disc desiccation. Chronic reactive endplate changes with marginal endplate osteophytic spurring. Mild facet hypertrophy. Resultant mild to moderate bilateral lateral recess narrowing, left greater than right. No frank impingement. Foramina remain patent. IMPRESSION: 1. No acute abnormality within the lumbar spine. 2. 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 No overt neural impingement. 3. Additional mild noncompressive disc bulging at L2-3 through L4-5 without stenosis or neural impingement. Electronically Signed   By: Jeannine Boga M.D.   On: 06/10/2018 01:11    Review of Systems  Constitutional: Negative for chills and fever.  HENT: Negative for sore throat and tinnitus.   Eyes: Negative for blurred vision and redness.  Respiratory: Negative for cough and shortness of breath.   Cardiovascular: Negative for chest pain, palpitations, orthopnea and PND.  Gastrointestinal: Negative for abdominal pain, diarrhea, nausea and vomiting.  Genitourinary: Negative for dysuria, frequency and urgency.  Musculoskeletal: Negative for joint pain and myalgias.  Skin: Negative for rash.       No lesions  Neurological: Positive for sensory change and weakness. Negative for speech change and focal weakness.  Endo/Heme/Allergies: Does not bruise/bleed easily.       No temperature intolerance  Psychiatric/Behavioral: Negative for depression and suicidal ideas.    Blood pressure 134/75, pulse 66, temperature (!) 97.4 F (36.3 C), temperature source Oral, resp. rate 16, height '5\' 7"'$  (1.702 m), weight 114.4 kg, SpO2 99 %. Physical Exam  Vitals reviewed. Constitutional: She is oriented to person, place, and time. She appears well-developed and well-nourished. No distress.  HENT:  Head: Normocephalic and atraumatic.  Mouth/Throat: Oropharynx is clear and moist.  Eyes: Pupils are equal, round, and reactive to light. Conjunctivae and EOM are normal. No scleral icterus.  Neck: Normal range of motion. Neck supple. No JVD present. No tracheal deviation present. No thyromegaly present.  Cardiovascular: Normal rate, regular rhythm and normal heart sounds. Exam reveals no gallop and no friction rub.  No murmur heard. Respiratory: Effort normal and breath sounds normal.  GI: Soft. Bowel sounds are  normal. She exhibits no distension. There is no tenderness.  Genitourinary:  Genitourinary Comments: Deferred  Musculoskeletal: Normal range of motion. She exhibits no edema.  Lymphadenopathy:    She has no cervical adenopathy.  Neurological: She is alert and oriented to person,  place, and time. A sensory deficit is present. No cranial nerve deficit. She exhibits normal muscle tone.  Reflex Scores:      Patellar reflexes are 1+ on the right side and 0 on the left side. Skin: Skin is warm and dry. No rash noted. No erythema.  Psychiatric: She has a normal mood and affect. Her behavior is normal. Judgment and thought content normal.     Assessment/Plan This is a 57 year old female admitted for lower extremity weakness. 1.  Lower extremity weakness: Left lower leg. Babinksi maneuver elicits nothing on the left. Decreased sensation and discriminate touch of left foot.  The patient does have fasciculations in her left thigh but is unable to move the rest of the extremity.  Spine was normal. Concern for upper motor neuron impairment.  I have ordered an MRI of the patient's brain and consulted neurosurgery for her back pain. 2.  Transaminitis: Unclear significance or etiology.  Does not seem to be related to primary weakness 3.  Hypokalemia: Replete potassium 4.  Diabetes mellitus type II: Continue basal insulin as well as sliding scale while hospitalized 5.  DVT prophylaxis: Lovenox 6.  GI prophylaxis: None The patient is a full code.  Time spent on admission orders and patient care approximately 45 minutes  Harrie Foreman, MD 06/10/2018, 7:34 AM

## 2018-06-10 NOTE — Telephone Encounter (Signed)
leftvoicemail for [facility] in regards to dispensing skelaxin

## 2018-06-10 NOTE — Consult Note (Signed)
Referring Physician:  No referring provider defined for this encounter.  Primary Physician:  Glori Luis, MD  Chief Complaint:  Lumbar spondylosis, left>right leg weakness  History of Present Illness: 06/10/2018 Laura Small is a 57 y.o. female who presents with the chief complaint of pain and numbness in her left leg.  She has had longstanding issues of leg and back pain for at least 2 years, and has seen multiple neurosurgeons for this including Dr. Emogene Morgan at [facility] and Dr. Dutch Quint in [place]. Neither offered surgery.  She presented to the hospital at [facility] after having shooting pain down her left leg when bending over on Saturday.  She reports pain in her entire leg that then dissipated and left her leg numb.  She has also had weakness, though she is able to bear weight when she locks her leg.  She denies bowel or bladder dysfunction   Provoking: aggravated by nothing Alleviating: made better by nothign  Laura Small has no symptoms of cervical myelopathy.  The symptoms are causing a significant impact on the patient's life.   Review of Systems:  A 10 point review of systems is negative, except for the pertinent positives and negatives detailed in the HPI.  Past Medical History: Past Medical History:  Diagnosis Date  . Back pain   . Chicken pox   . Diabetes mellitus without complication (HCC)   . Migraines   . PONV (postoperative nausea and vomiting)    usually has 1 episode of vomiting within 1 hour of anesthesia     Past Surgical History: Past Surgical History:  Procedure Laterality Date  . BACK SURGERY    . CHOLECYSTECTOMY    . INSERTION OF MESH N/A 02/12/2015   Procedure: INSERTION OF MESH;  Surgeon: Natale Lay, MD;  Location: [facility] ORS;  Service: General;  Laterality: N/A;  . VENTRAL HERNIA REPAIR N/A 02/12/2015   Procedure: HERNIA REPAIR VENTRAL ADULT;  Surgeon: Natale Lay, MD;  Location: [facility] ORS;  Service: General;  Laterality:  N/A;    Allergies: Allergies as of 06/09/2018 - Review Complete 06/09/2018  Allergen Reaction Noted  . Bee venom Anaphylaxis and Swelling 02/07/2015  . Advil [ibuprofen] Swelling 09/08/2016  . Jardiance [empagliflozin] Rash 07/31/2017  . Nitroglycerin Other (See Comments) 05/26/2017  . Prednisone Swelling 12/15/2016    Medications:  Current Facility-Administered Medications:  .  acetaminophen (TYLENOL) tablet 650 mg, 650 mg, Oral, Q6H PRN, 650 mg at 06/10/18 1118 **OR** acetaminophen (TYLENOL) suppository 650 mg, 650 mg, Rectal, Q6H PRN, Arnaldo Natal, MD .  aspirin EC tablet 81 mg, 81 mg, Oral, Daily, Arnaldo Natal, MD, 81 mg at 06/10/18 1046 .  docusate sodium (COLACE) capsule 100 mg, 100 mg, Oral, BID, Arnaldo Natal, MD .  DULoxetine (CYMBALTA) DR capsule 60 mg, 60 mg, Oral, Daily, Arnaldo Natal, MD .  enoxaparin (LOVENOX) injection 40 mg, 40 mg, Subcutaneous, Q24H, Arnaldo Natal, MD .  insulin aspart (novoLOG) injection 0-5 Units, 0-5 Units, Subcutaneous, QHS, Arnaldo Natal, MD .  insulin aspart (novoLOG) injection 0-9 Units, 0-9 Units, Subcutaneous, TID WC, Arnaldo Natal, MD, 2 Units at 06/10/18 1341 .  insulin glargine (LANTUS) injection 24 Units, 24 Units, Subcutaneous, QHS, Wieting, Richard, MD .  ondansetron (ZOFRAN) tablet 4 mg, 4 mg, Oral, Q6H PRN **OR** ondansetron (ZOFRAN) injection 4 mg, 4 mg, Intravenous, Q6H PRN, Arnaldo Natal, MD .  oxyCODONE-acetaminophen (PERCOCET/ROXICET) 5-325 MG per tablet 1 tablet, 1 tablet, Oral, Q6H PRN, Sheryle Hail,  Kelton PillarMichael S, MD   Social History: Social History   Tobacco Use  . Smoking status: Former Smoker    Packs/day: 0.00    Types: Cigarettes  . Smokeless tobacco: Never Used  Substance Use Topics  . Alcohol use: No  . Drug use: No    Family Medical History: Family History  Problem Relation Age of Onset  . Diabetes Mother   . Gallbladder disease Mother   . Heart disease Father   . Alcohol  abuse Father   . Gallbladder disease Father   . Heart disease Paternal Grandmother   . Colon cancer Neg Hx     Physical Examination: Vitals:   06/10/18 0357 06/10/18 1424  BP: 134/75 109/67  Pulse: 66 67  Resp: 16 18  Temp: (!) 97.4 F (36.3 C) 97.7 F (36.5 C)  SpO2: 99% 98%     General: Patient is well developed, well nourished, calm, collected, and in no apparent distress.  Psychiatric: Patient is non-anxious.  Head:  Pupils equal, round, and reactive to light.  ENT:  Oral mucosa appears well hydrated.  Neck:   Supple.  Full range of motion.  Respiratory: Patient is breathing without any difficulty.  Extremities: No edema.  Vascular: Palpable pulses in dorsal pedal vessels.  Skin:   On exposed skin, there are no abnormal skin lesions.  NEUROLOGICAL:  General: In no acute distress.   Awake, alert, oriented to person, place, and time.  Pupils equal round and reactive to light.  Facial tone is symmetric.  Tongue protrusion is midline.  There is no pronator drift.  ROM of spine: full.    Strength: Side Biceps Triceps Deltoid Interossei Grip Wrist Ext. Wrist Flex.  R 5 5 5 5 5 5 5   L 5 5 5 5 5 5 5    Side Iliopsoas Quads Hamstring PF DF EHL  R 4- 4- 4- 4- 4- 4  L 1 2 2 2 2 2    Reflexes are 1+ and symmetric at the biceps, triceps, brachioradialis, patella and achilles.   Bilateral upper and lower extremity sensation is intact to light touch and pin prick except in her Left leg from the inguinal ligament distally along her entire leg.  Clonus is not present.  Toes are down-going.  Gait is abnormal - cannot walk.  Cannot perform tandem gait.  Hoffman's is absent.  Imaging: MRI L spine 06/10/2018 IMPRESSION: 1. No acute abnormality within the lumbar spine. 2. Chronic degenerative disc osteophyte at L5-S1 with resultant mild to moderate bilateral lateral recess narrowing, left greater than right. No overt neural impingement. 3. Additional mild noncompressive disc  bulging at L2-3 through L4-5 without stenosis or neural impingement.   Electronically Signed   By: Rise MuBenjamin  McClintock M.D.   On: 06/10/2018 01:11  I have personally reviewed the images and agree with the above interpretation.  Assessment and Plan: Laura Small is a pleasant 57 y.o. female with left leg numbness and weakness in the left leg worse than right leg.  There is no significant lumbar spinal stenosis that could explain the degree of her weakness.  I would not recommend any surgery for this.  I agree with Dr. Thad Rangereynolds' recommendation for thoracic spine MRI.  I would like to review this scan for final recommendations.      Colleen Donahoe K. Myer HaffYarbrough MD, MPHS Dept. of Neurosurgery
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
## 2018-06-10 NOTE — ED Provider Notes (Signed)
I reviewed the patient's MRI with the patient and her husband at bedside.  MRI essentially unchanged from previous with no acute disease noted however on my exam she has 0 out of 5 strength in her left lower extremity up to the hip.  The patient's symptoms are now 274 days old.  Given the chronicity I will not consult neurosurgery emergently but I do think she requires inpatient admission for IV pain control, neurosurgical evaluation, and physical therapy.  I spoken with the hospitalist Dr. Anne HahnWillis who has graciously agreed to admit the patient to his service.   Merrily Brittleifenbark, Avital Dancy, MD 06/10/18 0157

## 2018-06-10 NOTE — ED Notes (Signed)
Pt to MRI

## 2018-06-10 NOTE — Telephone Encounter (Signed)
Spoke to ProctorSamantha at [facility] , Skelaxin is on manufacture  back order, they are wondering if you can bridge patient over to methocarbamol

## 2018-06-10 NOTE — Progress Notes (Signed)
Patient ID: Laura Small, female   DOB: 1961-03-14, 57 y.o.   MRN: [id number]  [facility] PROGRESS NOTE  Laura Small WUJ:[id number] DOB: 1961/03/29 DOA: 06/09/2018 PCP: Glori Luis, MD  HPI/Subjective: Patient states that she cannot move her left leg or feel her left leg.  She used to be able to walk with a cane or walker and now it is a walker or wheelchair.  Patient states that she is very weak.  She heard a pop in her lower back and has been worsening and worsening.  Feels it in both of her hips.  On Saturday had some worsening pain down the left leg.  Now can hardly walk.  Objective: Vitals:   06/10/18 0300 06/10/18 0357  BP: 134/80 134/75  Pulse: 73 66  Resp:  16  Temp:  (!) 97.4 F (36.3 C)  SpO2: 94% 99%    Intake/Output Summary (Last 24 hours) at 06/10/2018 1411 Last data filed at 06/10/2018 1349 Gross per 24 hour  Intake 480 ml  Output -  Net 480 ml   Filed Weights   06/09/18 2153 06/10/18 0412  Weight: 105.2 kg 114.4 kg    ROS: Review of Systems  Constitutional: Negative for chills and fever.  Eyes: Negative for blurred vision.  Respiratory: Negative for cough and shortness of breath.   Cardiovascular: Negative for chest pain.  Gastrointestinal: Negative for constipation, diarrhea, nausea and vomiting.  Genitourinary: Negative for dysuria.  Musculoskeletal: Positive for back pain, joint pain and myalgias.  Neurological: Negative for dizziness and headaches.   Exam: Physical Exam  Constitutional: She is oriented to person, place, and time.  HENT:  Nose: No mucosal edema.  Mouth/Throat: No oropharyngeal exudate or posterior oropharyngeal edema.  Eyes: Pupils are equal, round, and reactive to light. Conjunctivae, EOM and lids are normal.  Neck: No JVD present. Carotid bruit is not present. No edema present. No thyroid mass and no thyromegaly present.  Cardiovascular: S1 normal and S2 normal. Exam reveals no gallop.  No murmur  heard. Pulses:      Dorsalis pedis pulses are 2+ on the right side, and 2+ on the left side.  Respiratory: No respiratory distress. She has no wheezes. She has no rhonchi. She has no rales.  GI: Soft. Bowel sounds are normal. There is no tenderness.  Musculoskeletal:       Right ankle: She exhibits no swelling.       Left ankle: She exhibits no swelling.  Pain bilateral sacroiliac joints.  Pain to palpation over her lumbar and thoracic spine.  Lymphadenopathy:    She has no cervical adenopathy.  Neurological: She is alert and oriented to person, place, and time. No cranial nerve deficit.  Patient cannot lift her left leg up off the bed.  Can hardly flex or extend her toes on her left leg.  I did see her readjust herself in the bed and was able to flex at the knee.  Skin: Skin is warm. No rash noted. Nails show no clubbing.  Psychiatric: She has a normal mood and affect.      Data Reviewed: Basic Metabolic Panel: Recent Labs  Lab 06/10/18 0213  NA 137  K 3.3*  CL 106  CO2 19*  GLUCOSE 144*  BUN 11  CREATININE 0.63  CALCIUM 9.2   Liver Function Tests: Recent Labs  Lab 06/10/18 0213  AST 43*  ALT 64*  ALKPHOS 100  BILITOT 0.8  PROT 7.5  ALBUMIN 4.1   CBC:  Recent Labs  Lab 06/10/18 0213  WBC 11.1*  NEUTROABS 6.3  HGB 15.3  HCT 43.6  MCV 88.1  PLT 314    CBG: Recent Labs  Lab 06/10/18 0401 06/10/18 0726 06/10/18 1207  GLUCAP 120* 187* 186*     Studies: Mr Laqueta JeanBrain W Wo Contrast  Result Date: 06/10/2018 CLINICAL DATA:  Acute presentation with lower extremity weakness and back pain. Left leg weakness worse than right. EXAM: MRI HEAD WITHOUT AND WITH CONTRAST TECHNIQUE: Multiplanar, multiecho pulse sequences of the brain and surrounding structures were obtained without and with intravenous contrast. CONTRAST:  20mL MULTIHANCE GADOBENATE DIMEGLUMINE 529 MG/ML IV SOLN COMPARISON:  None. FINDINGS: Brain: Diffusion imaging does not show any acute or subacute  infarction. Brainstem and cerebellum are normal. Cerebral hemispheres show scattered foci of T2 and FLAIR signal within the frontal subcortical white matter in the white matter adjacent to the frontal horn of the left lateral ventricle. These are consistent with minimal small vessel change. No cortical or large vessel territory infarction. No mass lesion, hemorrhage, hydrocephalus or extra-axial collection. After contrast administration, no abnormal enhancement occurs. Vascular: Major vessels at the base of the brain show flow. Skull and upper cervical spine: Negative Sinuses/Orbits: Clear/normal Other: None IMPRESSION: No acute or reversible finding. No specific explanation for lower extremity weakness identified. Scattered small foci of T2 and FLAIR signal in the frontal subcortical white matter and adjacent to the frontal horn of the left lateral ventricle, most consistent with an early manifestation of small vessel disease. The differential diagnosis could also include demyelinating disease, but that is less likely in this patient with a history of diabetes. Electronically Signed   By: Paulina FusiMark  Shogry M.D.   On: 06/10/2018 13:15   Mr Lumbar Spine Wo Contrast  Result Date: 06/10/2018 CLINICAL DATA:  Initial evaluation for worsening chronic lower back pain with left lower extremity paralysis. EXAM: MRI LUMBAR SPINE WITHOUT CONTRAST TECHNIQUE: Multiplanar, multisequence MR imaging of the lumbar spine was performed. No intravenous contrast was administered. COMPARISON:  Prior CT from 11/09/2017. FINDINGS: Segmentation: Normal segmentation. Lowest well-formed disc labeled the L5-S1 level. Alignment: Vertebral bodies normally aligned with preservation of the normal lumbar lordosis. No listhesis. Vertebrae: Vertebral body heights well maintained without evidence for acute or chronic fracture. Bone marrow signal intensity within normal limits. No discrete or worrisome osseous lesions. Chronic reactive endplate changes  present about the L5-S1 interspace. Conus medullaris and cauda equina: Conus extends to the L2 level. Conus and cauda equina appear normal. Paraspinal and other soft tissues: Paraspinous soft tissues within normal limits. Visualized visceral structures are normal. Disc levels: L1-2:  Unremarkable. L2-3:  Mild annular disc bulge.  No canal or foraminal stenosis. L3-4: Mild diffuse disc bulge with disc desiccation. Small central annular fissure noted. Mild facet hypertrophy. No canal or foraminal stenosis. L4-5: Mild annular disc bulge with disc desiccation. Mild to moderate facet and ligament flavum hypertrophy. Borderline mild bilateral lateral recess narrowing, slightly greater on the left. Foramina remain patent. No impingement. L5-S1: Chronic intervertebral disc space narrowing with diffuse disc bulge and disc desiccation. Chronic reactive endplate changes with marginal endplate osteophytic spurring. Mild facet hypertrophy. Resultant mild to moderate bilateral lateral recess narrowing, left greater than right. No frank impingement. Foramina remain patent. IMPRESSION: 1. No acute abnormality within the lumbar spine. 2. Chronic degenerative disc osteophyte at L5-S1 with resultant mild to moderate bilateral lateral recess narrowing, left greater than right. No overt neural impingement. 3. Additional mild noncompressive disc bulging at L2-3 through  L4-5 without stenosis or neural impingement. Electronically Signed   By: Rise Mu M.D.   On: 06/10/2018 01:11    Scheduled Meds: . aspirin EC  81 mg Oral Daily  . docusate sodium  100 mg Oral BID  . DULoxetine  60 mg Oral Daily  . enoxaparin (LOVENOX) injection  40 mg Subcutaneous Q24H  . insulin aspart  0-5 Units Subcutaneous QHS  . insulin aspart  0-9 Units Subcutaneous TID WC  . insulin glargine  24 Units Subcutaneous QHS  . methylPREDNISolone (SOLU-MEDROL) injection  40 mg Intravenous Once  . potassium chloride  40 mEq Oral Once   Continuous  Infusions:  Assessment/Plan:  1. Left lower extremity weakness, low back pain.  MRI of the lumbar spine showing chronic degenerative disc disease with mild to moderate bilateral lateral recess narrowing at L5-S1 greater on the left than the right, disc bulging L2-L3 through L4-L5 without stenosis or neural impingement.  Neurosurgery did call me on the phone and said that there is no neurosurgical intervention needed.  We will give a dose of Solu-Medrol and continue her aspirin as anti-inflammatory to see if things improve.  We will get physical therapy and Occupational Therapy and neurology consultations.  MRI of the brain negative for acute stroke. 2. Type 2 diabetes mellitus.  With starting of Solu-Medrol give Lantus now 24 units.  She states that she takes a sliding scale of the Lantus.  Add on a hemoglobin A1c 3. Hypokalemia replace potassium  Code Status:     Code Status Orders  (From admission, onward)         Start     Ordered   06/10/18 0322  Full code  Continuous     06/10/18 0321        Code Status History    Date Active Date Inactive Code Status Order ID Comments User Context   10/13/2017 1412 10/16/2017 1413 Full Code [id number]  Clapacs, Jackquline Denmark, MD Inpatient     Family Communication: Brother at the bedside Disposition Plan: To be determined  Consultants:  Neurology  Neurosurgery  Time spent: 35 minutes  Karlye Ihrig [employer]

## 2018-06-11 ENCOUNTER — Observation Stay: Payer: Self-pay

## 2018-06-11 LAB — GLUCOSE, CAPILLARY
Glucose-Capillary: 112 mg/dL — ABNORMAL HIGH (ref 70–99)
Glucose-Capillary: 125 mg/dL — ABNORMAL HIGH (ref 70–99)

## 2018-06-11 LAB — HEMOGLOBIN A1C
Hgb A1c MFr Bld: 7 % — ABNORMAL HIGH (ref 4.8–5.6)
MEAN PLASMA GLUCOSE: 154 mg/dL

## 2018-06-11 MED ORDER — GABAPENTIN 300 MG PO CAPS: 900.0000 mg | ORAL_CAPSULE | Freq: Three times a day (TID) | ORAL | 0 refills | Status: DC

## 2018-06-11 MED ORDER — INSULIN PEN NEEDLE 32G X 6 MM MISC: 3 refills | Status: DC

## 2018-06-11 MED ORDER — DEXAMETHASONE 4 MG PO TABS: ORAL_TABLET | ORAL | 0 refills | Status: DC

## 2018-06-11 MED ORDER — METFORMIN HCL ER 500 MG PO TB24: 500.0000 mg | ORAL_TABLET | Freq: Every day | ORAL | Status: DC

## 2018-06-11 MED ORDER — METHYLPREDNISOLONE SODIUM SUCC 40 MG IJ SOLR
40.0000 mg | Freq: Once | INTRAMUSCULAR | Status: AC
  Administered 2018-06-11: 40 mg via INTRAVENOUS
  Filled 2018-06-11: qty 1

## 2018-06-11 MED ORDER — INSULIN GLARGINE 100 UNIT/ML ~~LOC~~ SOLN: 24.0000 [IU] | Freq: Every day | SUBCUTANEOUS | 11 refills | Status: DC

## 2018-06-11 MED ORDER — GABAPENTIN 300 MG PO CAPS: 900.0000 mg | ORAL_CAPSULE | Freq: Three times a day (TID) | ORAL | Status: DC

## 2018-06-11 MED ORDER — OXYCODONE-ACETAMINOPHEN 5-325 MG PO TABS: 1.0000 | ORAL_TABLET | Freq: Four times a day (QID) | ORAL | 0 refills | Status: DC | PRN

## 2018-06-11 NOTE — Care Management Note (Signed)
Case Management Note  Patient Details  Name: Laura Small MRN: [id number] Date of Birth: 02/13/1961  Subjective/Objective:   Admitted to [facility] with the diagnosis of lower extremity weakness. Lives with husband, Laura Small [phone]). Brother, Laura Small is in the home [phone]). Sees Dr. De NurseSonneberg as primary care physician. Prescriptions are filled at [facility]. No home health. No skilled facility. Grabbers, stationary walker, rolling walker, and wheelchair in the home. Takes care of all basic activities of daily living herself, doesn't "unitl these drugs are out of my system." No falls. Family will transport                 Action/Plan: Physical therapy is recommending skilled nursing facility. Declining this option. Discussed home health services. Declining this option. States "My brother took care of our mother, so he can help me."    Expected Discharge Date:                  Expected Discharge Plan:     In-House Referral:    yes Discharge planning Services    Yes Post Acute Care Choice:    Choice offered to:     DME Arranged:    DME Agency:     HH Arranged:    HH Agency:     Status of Service:     If discussed at [employer] Meetings, dates discussed:    Additional Comments:  Gwenette GreetBrenda S Gunter Conde, RN MSN CCM Care Management [phone] 06/11/2018, 10:17 AM

## 2018-06-11 NOTE — Discharge Instructions (Signed)
Recommend EMG and nerve conduction studies with your neurologist

## 2018-06-11 NOTE — Progress Notes (Signed)
Physical Therapy Treatment Patient Details Name: Laura Small MRN: [id number] DOB: 07/24/1961 Today's Date: 06/11/2018    History of Present Illness Pt. is a 57 y.o. female who [facility] with LE weakness. Pt. PMHx includes: LBP, DM, Migraines    PT Comments    Pt pleasant on arrival and enthusiastic about starting PT. Per pt she has been able to perform HEP. Pt also states that they have tingling/itchy feeling in BLE that "feels like healing." Pt performed ther-ex with greater ROM, demonstrating improve ability to control BLE. Required CGA to convince her of her ability to perform SLR on LLE without assist. Performs transfers with CGA and RW for UE support, equal WB on BLE. Standing balance endurance increased, stood 2 minutes without LOB. Pt also amb greater distance this date, further distance not completed due to MD entry will defer to next session.   Follow Up Recommendations  Home health PT     Equipment Recommendations  Other (comment)    Recommendations for Other Services       Precautions / Restrictions Precautions Precautions: None Restrictions Weight Bearing Restrictions: No    Mobility  Bed Mobility Overal bed mobility: Modified Independent             General bed mobility comments: Pt uses LUE to assist LLE to edge of bed, has control of RLE, demonstrates good trunk control t/o bed mobility.  Transfers Overall transfer level: Needs assistance Equipment used: Rolling walker (2 wheeled) Transfers: Sit to/from Stand Sit to Stand: Min guard         General transfer comment: Pt uses RW and bed for UE support to rise. In standing weight is evenly distribute between BLE, RW used for UE support. No LOB noted.  Ambulation/Gait Ambulation/Gait assistance: Min guard Gait Distance (Feet): 4 Feet Assistive device: Rolling walker (2 wheeled) Gait Pattern/deviations: Decreased stance time - left Gait velocity: Decreased from baseline.    General Gait Details: Pt performed pre-gait activity marching in place. Continues to demonstrate ability to WB in SLS for short periods. During gait pt has B dec step length and dec foot clearance. Requires inc time.    Stairs             Wheelchair Mobility    Modified Rankin (Stroke Patients Only)       Balance Overall balance assessment: Needs assistance Sitting-balance support: Feet supported Sitting balance-Leahy Scale: Fair Sitting balance - Comments: Pt has good trunk control.   Standing balance support: Bilateral upper extremity supported Standing balance-Leahy Scale: Poor Standing balance comment: Pt requires RW for UE support to maintain balance and can weight shift laterally without LOB.                            Cognition Arousal/Alertness: Awake/alert Behavior During Therapy: WFL for tasks assessed/performed Overall Cognitive Status: Within Functional Limits for tasks assessed                                 General Comments: Follows commands without difficulty      Exercises Other Exercises Other Exercises: Supine ankle pumps, glute sets, quad sets, hip abd/add, and SLRs AROM on RLE with supervision and AAROM on LLE with min assist. All ther-ex performed 10-12 reps with VC for technique.    General Comments        Pertinent Vitals/Pain Pain Assessment: No/denies  pain Pain Score: 0-No pain    Home Living Family/patient expects to be discharged to:: Private residence Living Arrangements: Spouse/significant other;Other relatives(Brother) Available Help at Discharge: Family;Available 24 hours/day Type of Home: House Home Access: Stairs to enter Entrance Stairs-Rails: None Home Layout: Two level;Bed/bath upstairs Home Equipment: Walker - 4 wheels;Cane - single point;Wheelchair - manual;Grab bars - tub/shower;Walker - standard      Prior Function Level of Independence: Independent with assistive device(s)       Comments: Pt.'s husband assisted with ADLs as needed. Pt.'s brother prepared meals. Mod indep with limited household mobility with RW, self-propelled w/c used for shorter community distances. Max assist with w/c propulsion for longer community distances.   PT Goals (current goals can now be found in the care plan section) Acute Rehab PT Goals Patient Stated Goal: Pt. plans to return home PT Goal Formulation: With patient Time For Goal Achievement: 06/24/18 Potential to Achieve Goals: Good Progress towards PT goals: Progressing toward goals    Frequency    Min 2X/week      PT Plan Discharge plan needs to be updated    Co-evaluation              AM-PAC PT "6 Clicks" Daily Activity  Outcome Measure  Difficulty turning over in bed (including adjusting bedclothes, sheets and blankets)?: None Difficulty moving from lying on back to sitting on the side of the bed? : None Difficulty sitting down on and standing up from a chair with arms (e.g., wheelchair, bedside commode, etc,.)?: Unable Help needed moving to and from a bed to chair (including a wheelchair)?: A Little Help needed walking in hospital room?: A Little Help needed climbing 3-5 steps with a railing? : A Lot 6 Click Score: 17    End of Session Equipment Utilized During Treatment: Gait belt Activity Tolerance: Patient tolerated treatment well Patient left: in chair;with call bell/phone within reach;with chair alarm set;Other (comment)(with MD in room) Nurse Communication: Mobility status PT Visit Diagnosis: Unsteadiness on feet (R26.81);Other abnormalities of gait and mobility (R26.89);Muscle weakness (generalized) (M62.81);Other symptoms and signs involving the nervous system (Z61.096)     Time: 0454-0981 PT Time Calculation (min) (ACUTE ONLY): 17 min  Charges:                        Arvilla Meres, SPT    Arvilla Meres 06/11/2018, 1:08 PM

## 2018-06-11 NOTE — Evaluation (Signed)
Occupational Therapy Evaluation Patient Details Name: Laura Small MRN: [id number] DOB: [birth year] Today's Date: 06/11/2018    History of Present Illness Pt. is a 57 y.o. female who was admitted to [facility] with LE weakness. Pt. PMHx includes: LBP, DM, Migraines   Clinical Impression   Pt. Presents with BLE weakness, pain,and limited functional mobility which hinder their ability to complete ADL, and IADL tasks. Pt. Resides at home with her husband, and brother.  Pt.'s husband assisted with ADLs. Pt.'s brother assisted pt. With meal preparation. Pt. has been taking college classes for Forensic Psychology. Before attending college, the pt. Worked as a Naval architect. Pt. Education was provided about A/E use for LE ADLs. Pt. conintues to require increased assist for LE ADLs. Pt. could benefit from OT services for ADL training, A/E training, and pt. education about home modification, and DME. Pt. would benefit from SNF level of care upon discharge. Pt. could benefit from follow-up OT services at discharge.   Follow Up Recommendations  SNF    Equipment Recommendations       Recommendations for Other Services       Precautions / Restrictions Precautions Precautions: None Restrictions Weight Bearing Restrictions: No      Mobility Bed Mobility Overal bed mobility: Modified Independent                Transfers Overall transfer level: Needs assistance Equipment used: Rolling walker (2 wheeled) Transfers: Sit to/from Stand Sit to Stand: Min guard         General transfer comment: Mobility per PT report    Balance                                           ADL either performed or assessed with clinical judgement   ADL Overall ADL's : Needs assistance/impaired Eating/Feeding: Set up   Grooming: Set up   Upper Body Bathing: Set up;Independent   Lower Body Bathing: Set up;Moderate assistance   Upper Body Dressing : Set up;Independent   Lower  Body Dressing: Set up;Moderate assistance                 General ADL Comments: Pt. education was provided about A/E use for LE ADLs.     Vision Baseline Vision/History: No visual deficits Patient Visual Report: No change from baseline       Perception     Praxis      Pertinent Vitals/Pain Pain Assessment: Faces Pain Score: 0-No pain     Hand Dominance     Extremity/Trunk Assessment     Lower Extremity Assessment Lower Extremity Assessment: RLE deficits/detail RLE Deficits / Details: Generalized weakness 3-/5 RLE Sensation: WNL LLE Deficits / Details: Generalized weakness  2-/5 LLE Sensation: decreased light touch       Communication Communication Communication: No difficulties   Cognition Arousal/Alertness: Awake/alert Behavior During Therapy: WFL for tasks assessed/performed Overall Cognitive Status: Within Functional Limits for tasks assessed                                     General Comments       Exercises     Shoulder Instructions      Home Living Family/patient expects to be discharged to:: Private residence Living Arrangements: Spouse/significant other;Other relatives(Brother) Available Help at Discharge: Family;Available 24 hours/day  Type of Home: House Home Access: Stairs to enter Enter[employer] CorporationEntrance Stairs-Number of Steps: 4 Entrance Stairs-Rails: None Home Layout: Two level;Bed/bath upstairs Alternate Level Stairs-Number of Steps: 6 Alternate Level Stairs-Rails: None Bathroom Shower/Tub: Chief Strategy OfficerTub/shower unit   Bathroom Toilet: Standard     Home Equipment: Environmental consultantWalker - 4 wheels;Cane - single point;Wheelchair - manual;Grab bars - tub/shower;Walker - standard          Prior Functioning/Environment Level of Independence: Independent with assistive device(s)        Comments: Pt.'s husband assisted with ADLs as needed. Pt.'s brother prepared meals. Mod indep with limited household mobility with RW, self-propelled w/c used for  shorter community distances. Max assist with w/c propulsion for longer community distances.        OT Problem List: Decreased strength;Decreased activity tolerance;Decreased range of motion;Pain;Decreased knowledge of use of DME or AE;Impaired UE functional use      OT Treatment/Interventions: Self-care/ADL training;Therapeutic exercise;Patient/family education;Therapeutic activities;DME and/or AE instruction    OT Goals(Current goals can be found in the care plan section) Acute Rehab OT Goals Patient Stated Goal: Pt. plans to return home OT Goal Formulation: With patient Potential to Achieve Goals: Good  OT Frequency: Min 2X/week   Barriers to D/C:            Co-evaluation              AM-PAC PT "6 Clicks" Daily Activity     Outcome Measure Help from another person eating meals?: None Help from another person taking care of personal grooming?: None Help from another person toileting, which includes using toliet, bedpan, or urinal?: A Lot Help from another person bathing (including washing, rinsing, drying)?: A Lot Help from another person to put on and taking off regular upper body clothing?: None Help from another person to put on and taking off regular lower body clothing?: A Lot 6 Click Score: 18   End of Session Equipment Utilized During Treatment: Gait belt  Activity Tolerance: Patient tolerated treatment well Patient left: in bed;with call bell/phone within reach;with bed alarm set  OT Visit Diagnosis: Muscle weakness (generalized) (M62.81)                Time: [id number] OT Time Calculation (min): 21 min Charges:  OT General Charges $OT Visit: 1 Visit OT Evaluation $OT Eval Moderate Complexity: 1 Mod Olegario MessierElaine Kaliann Coryell, MS, OTR/L  Olegario Messierlaine Kacey Vicuna 06/11/2018, 10:26 AM
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
## 2018-06-11 NOTE — Clinical Social Work Note (Signed)
Clinical Social Work Assessment  Patient Details  Name: Laura Small MRN: [id number] Date of Birth: 1961/04/29  Date of referral:  06/11/18               Reason for consult:  Facility Placement                Permission sought to share information with:  Case Manager, Customer service manager, Family Supports Permission granted to share information::  Yes, Verbal Permission Granted  Name::        Agency::     Relationship::     Contact Information:     Housing/Transportation Living arrangements for the past 2 months:  Single Family Home Source of Information:  Patient Patient Interpreter Needed:  None Criminal Activity/Legal Involvement Pertinent to Current Situation/Hospitalization:  No - Comment as needed Significant Relationships:  Siblings, Spouse Lives with:  Siblings, Spouse Do you feel safe going back to the place where you live?  Yes Need for family participation in patient care:  No (Coment)  Care giving concerns:  Patient lives with her husband and brother in [place].    Social Worker assessment / plan:  CSW consulted for SNF placement. CSW met with patient to discuss discharge plan. CSW introduced self to patient and explained role. Patient states that she lives with her husband and brother. CSW explained that PT is recommending SNF placement. CSW explained that since she does not have insurance, she would have to pay out of pocket. Patient states that she can not pay out of pocket and would prefer to go home. CSW made [facility] aware of above. CSW signing off. Please re consult if further needs arise.   Employment status:  Unemployed Forensic scientist:  Self Pay (Medicaid Pending) PT Recommendations:  [place] / Referral to community resources:  [place]  Patient/Family's Response to care:  Patient thanked CSW for assistance   Patient/Family's Understanding of and Emotional Response to Diagnosis, Current  Treatment, and Prognosis:  Patient in agreement with discharge plan   Emotional Assessment Appearance:  Appears stated age Attitude/Demeanor/Rapport:    Affect (typically observed):  Restless, Pleasant Orientation:  Oriented to Self, Oriented to Place, Oriented to  Time, Oriented to Situation Alcohol / Substance use:  Not Applicable Psych involvement (Current and /or in the community):  No (Comment)  Discharge Needs  Concerns to be addressed:  Discharge Planning Concerns Readmission within the last 30 days:  No Current discharge risk:  None Barriers to Discharge:  Continued Medical Work up   [employer], [place] 06/11/2018, 9:49 AM

## 2018-06-11 NOTE — Progress Notes (Signed)
Pt being discharged home, discharge instructions and prescriptions reviewed with pt and brother, states understanding, pt with no complaints

## 2018-06-14 ENCOUNTER — Telehealth: Payer: Self-pay | Admitting: Family Medicine

## 2018-06-14 NOTE — Telephone Encounter (Signed)
Transition Care Management Follow-up Telephone Call  How have you been since you were released from the hospital? Patient staed thet Gabapentin is helping with her pain but the side effects make her not feel like doing anything. " I feel no one listens to me about my pain" , " I just want my life back" patient stated .    Do you understand why you were in the hospital? yes   Do you understand the discharge instrcutions? yes  Items Reviewed:  Medications reviewed: yes  Allergies reviewed: yes  Dietary changes reviewed: yes  Referrals reviewed: yes   Functional Questionnaire:   Activities of Daily Living (ADLs):   She states they are independent in the following: feeding, continence, grooming, toileting and dressing States they require assistance with the following: ambulation and bathing and hygiene   Any transportation issues/concerns?: no   Any patient concerns? yes   Confirmed importance and date/time of follow-up visits scheduled: yes   Confirmed with patient if condition begins to worsen call PCP or go to the ER.  Patient was given the Call-a-Nurse line [phone]: yes

## 2018-06-16 ENCOUNTER — Other Ambulatory Visit: Payer: Self-pay | Admitting: Family Medicine

## 2018-06-16 ENCOUNTER — Encounter: Payer: Self-pay | Admitting: Family Medicine

## 2018-06-16 ENCOUNTER — Ambulatory Visit (INDEPENDENT_AMBULATORY_CARE_PROVIDER_SITE_OTHER): Payer: Self-pay | Admitting: Family Medicine

## 2018-06-16 VITALS — BP 118/72 | HR 78 | Temp 98.3°F | Ht 67.0 in

## 2018-06-16 DIAGNOSIS — E876 Hypokalemia: Secondary | ICD-10-CM

## 2018-06-16 DIAGNOSIS — M545 Low back pain: Secondary | ICD-10-CM

## 2018-06-16 DIAGNOSIS — G8929 Other chronic pain: Secondary | ICD-10-CM

## 2018-06-16 DIAGNOSIS — E119 Type 2 diabetes mellitus without complications: Secondary | ICD-10-CM

## 2018-06-16 DIAGNOSIS — R945 Abnormal results of liver function studies: Secondary | ICD-10-CM

## 2018-06-16 DIAGNOSIS — R7989 Other specified abnormal findings of blood chemistry: Secondary | ICD-10-CM | POA: Insufficient documentation

## 2018-06-16 DIAGNOSIS — Z794 Long term (current) use of insulin: Secondary | ICD-10-CM

## 2018-06-16 DIAGNOSIS — F322 Major depressive disorder, single episode, severe without psychotic features: Secondary | ICD-10-CM

## 2018-06-16 DIAGNOSIS — R29898 Other symptoms and signs involving the musculoskeletal system: Secondary | ICD-10-CM

## 2018-06-16 LAB — COMPREHENSIVE METABOLIC PANEL
ALT: 56 U/L — ABNORMAL HIGH (ref 0–35)
AST: 17 U/L (ref 0–37)
Albumin: 3.7 g/dL (ref 3.5–5.2)
Alkaline Phosphatase: 95 U/L (ref 39–117)
BUN: 12 mg/dL (ref 6–23)
CHLORIDE: 105 meq/L (ref 96–112)
CO2: 24 mEq/L (ref 19–32)
Calcium: 9 mg/dL (ref 8.4–10.5)
Creatinine, Ser: 0.6 mg/dL (ref 0.40–1.20)
GFR: 110.39 mL/min (ref >60.00)
GLUCOSE: 356 mg/dL — AB (ref 70–99)
POTASSIUM: 3 meq/L — AB (ref 3.5–5.1)
SODIUM: 138 meq/L (ref 135–145)
Total Bilirubin: 0.3 mg/dL (ref 0.2–1.2)
Total Protein: 6.7 g/dL (ref 6.0–8.3)

## 2018-06-16 MED ORDER — POTASSIUM CHLORIDE CRYS ER 20 MEQ PO TBCR: 40.0000 meq | EXTENDED_RELEASE_TABLET | Freq: Every day | ORAL | 0 refills | Status: DC

## 2018-06-16 MED ORDER — METAXALONE 800 MG PO TABS: 800.0000 mg | ORAL_TABLET | Freq: Four times a day (QID) | ORAL | 1 refills | Status: DC

## 2018-06-16 NOTE — Assessment & Plan Note (Signed)
Noted on labs from the hospital.  Possibly related to the acetaminophen and her Percocet.  She is not drinking alcohol per her report.  We will recheck.

## 2018-06-16 NOTE — Progress Notes (Signed)
Tommi Rumps, MD Phone: [phone]  Laura Small is a 57 y.o. female who presents today for hospital follow-up.  CC: Chronic back pain, left lower extremity weakness, diabetes, depression  Patient presents for hospital follow-up.  She was hospitalized for severe back pain with left lower extremity weakness from 06/09/2018-06/11/2018.  She developed relatively sudden onset left lower extremity numbness and weakness after having a popping and cracking sensation in her back with severe pain.  She underwent lumbar MRI which revealed chronic degenerative disc disease with mild to moderate bilateral lateral recess narrowing at L5-S1 and disc bulging without stenosis at L2-L3-L4 and L5.  She was supposed to undergo MRI thoracic spine though this was limited secondary to pain.  Sagittal appearance was normal for age.  She had an MRI brain that was negative for acute stroke.  She was placed on Neurontin 900 mg 3 times daily though she noted that caused some drowsiness and she discontinued this yesterday.  She refuses to taper off of this.  I did discuss the risk of seizure with suddenly discontinuing this.  She is unable to afford her Percocet for the next week.  She would like a refill on Skelaxin.  She notes she is able to move her left lower extremity some now.  The numbness is improving she is unable to afford home health PT.  Her Percocet frequency was decreased due to increased LFTs.  She is supposed to have nerve conduction studies and she notes the neurosurgeon wants to arrange those.  She is going to contact them for follow-up.  Discharge summary reviewed.  Medications reviewed.  Her current blood sugars are running 112-120.  She is using at most 20 units of basiglar weekly.  She is no longer on metformin.  No polyuria polydipsia.  No hypoglycemia.  A1c in the hospital was 7.0.  Depression: She notes currently this is doing fairly well.  She notes she would feel significantly better for  back pain was resolved.  She is going to school and likes that.  She finished writing a book recently.  She stopped the Cymbalta previously.  No SI.  Social History   Tobacco Use  Smoking Status Current Every Day Smoker  . Packs/day: 0.00  . Types: Cigarettes  Smokeless Tobacco Never Used     ROS see history of present illness  Objective  Physical Exam Vitals:   06/16/18 1024  BP: 118/72  Pulse: 78  Temp: 98.3 F (36.8 C)  SpO2: 98%    BP Readings from Last 3 Encounters:  06/16/18 118/72  06/11/18 (!) 145/81  04/14/18 (!) 144/90   Wt Readings from Last 3 Encounters:  06/11/18 214 lb 6.4 oz (97.3 kg)  01/13/18 227 lb 3.2 oz (103.1 kg)  01/04/18 230 lb 3.2 oz (104.4 kg)    Physical Exam  Constitutional: No distress.  Cardiovascular: Normal rate, regular rhythm and normal heart sounds.  Pulmonary/Chest: Effort normal and breath sounds normal.  Musculoskeletal: She exhibits no edema.  Neurological: She is alert.  3/5 strength left quad, hamstring, plantar flexion, and dorsiflexion, slightly decreased light touch sensation left lower extremity, 5/5 strength right quad, hamstring, plantar flexion, and dorsiflexion, sensation light touch intact right lower extremity  Skin: Skin is warm and dry. She is not diaphoretic.     Assessment/Plan: Please see individual problem list.  Lower extremity weakness New onset.  Undetermined cause.  Slight improvement since discharge.  There is no real obvious cause on MRI brain or MRI lumbar spine.  She will contact her neurosurgeon for an appointment to complete nerve conduction studies.  She will also follow-up with them for her chronic low back pain to see if there is any intervention that can be done.  She is given return precautions.  She has suddenly discontinue the gabapentin.  She does not want to taper off of this and refuses to do so.  I discussed the risk of suddenly stopping this medication.  Chronic low back pain Chronic  issue.  Continues to have issues with this.  She needs to see neurosurgery.  I discussed the need to see pain management as well to help with management of her pain.  She will continue to work on [employer] care paperwork.  DM (diabetes mellitus), type 2 ([place]) I have asked the patient to start checking a little more frequently at home so we can get a better idea of what her CBGs have been.  She will continue basiglar.  Depression, major, single episode, severe ([place]) Doing relatively well right now.  She is no longer on Cymbalta.  She does not want to take medication at this time.  No SI.  She will monitor and if worsening let us know.  Elevated LFTs Noted on labs from the hospital.  Possibly related to the acetaminophen and her Percocet.  She is not drinking alcohol per her report.  We will recheck.   Orders Placed This Encounter  Procedures  . Comp Met (CMET)    Meds ordered this encounter  Medications  . metaxalone (SKELAXIN) 800 MG tablet    Sig: Take 1 tablet (800 mg total) by mouth 4 (four) times daily.    Dispense:  120 tablet    Refill:  [place], MD [place]

## 2018-06-16 NOTE — Assessment & Plan Note (Signed)
Chronic issue.  Continues to have issues with this.  She needs to see neurosurgery.  I discussed the need to see pain management as well to help with management of her pain.  She will continue to work on [employer] care paperwork.

## 2018-06-16 NOTE — Assessment & Plan Note (Signed)
New onset.  Undetermined cause.  Slight improvement since discharge.  There is no real obvious cause on MRI brain or MRI lumbar spine.  She will contact her neurosurgeon for an appointment to complete nerve conduction studies.  She will also follow-up with them for her chronic low back pain to see if there is any intervention that can be done.  She is given return precautions.  She has suddenly discontinue the gabapentin.  She does not want to taper off of this and refuses to do so.  I discussed the risk of suddenly stopping this medication.

## 2018-06-16 NOTE — Assessment & Plan Note (Signed)
Doing relatively well right now.  She is no longer on Cymbalta.  She does not want to take medication at this time.  No SI.  She will monitor and if worsening let us know.

## 2018-06-16 NOTE — Patient Instructions (Signed)
Nice to see you. Please contact Dr Osborne Oman office for an appointment.  We will will call with lab results.  If develop new numbness, weakness, or any new or changing symptoms please seek medical attention immediately.

## 2018-06-16 NOTE — Assessment & Plan Note (Signed)
I have asked the patient to start checking a little more frequently at home so we can get a better idea of what her CBGs have been.  She will continue basiglar.

## 2018-06-17 ENCOUNTER — Telehealth: Payer: Self-pay | Admitting: Family Medicine

## 2018-06-17 ENCOUNTER — Emergency Department
Admission: EM | Admit: 2018-06-17 | Discharge: 2018-06-17 | Discharge status: Against Medical Advice | Payer: Self-pay | Attending: Emergency Medicine | Admitting: Emergency Medicine

## 2018-06-17 ENCOUNTER — Telehealth: Payer: Self-pay

## 2018-06-17 ENCOUNTER — Encounter: Payer: Self-pay | Admitting: Family Medicine

## 2018-06-17 ENCOUNTER — Encounter: Payer: Self-pay | Admitting: Emergency Medicine

## 2018-06-17 ENCOUNTER — Ambulatory Visit: Payer: Self-pay | Admitting: *Deleted

## 2018-06-17 ENCOUNTER — Other Ambulatory Visit: Payer: Self-pay

## 2018-06-17 DIAGNOSIS — Z5321 Procedure and treatment not carried out due to patient leaving prior to being seen by health care provider: Secondary | ICD-10-CM | POA: Insufficient documentation

## 2018-06-17 DIAGNOSIS — R29898 Other symptoms and signs involving the musculoskeletal system: Secondary | ICD-10-CM

## 2018-06-17 DIAGNOSIS — G8929 Other chronic pain: Secondary | ICD-10-CM

## 2018-06-17 DIAGNOSIS — M549 Dorsalgia, unspecified: Secondary | ICD-10-CM | POA: Insufficient documentation

## 2018-06-17 DIAGNOSIS — M545 Low back pain: Secondary | ICD-10-CM

## 2018-06-17 HISTORY — DX: Unspecified osteoarthritis, unspecified site: M19.90

## 2018-06-17 MED ORDER — OXYCODONE-ACETAMINOPHEN 5-325 MG PO TABS
1.0000 | ORAL_TABLET | Freq: Once | ORAL | Status: AC
  Administered 2018-06-17: 1 via ORAL

## 2018-06-17 MED ORDER — OXYCODONE-ACETAMINOPHEN 5-325 MG PO TABS
ORAL_TABLET | ORAL | Status: AC
  Filled 2018-06-17: qty 1

## 2018-06-17 NOTE — ED Notes (Signed)
FIRST NURSE NOTE:  Pt has chronic back pain for the past 22 months, states the pain shoots down both legs, pt is in wheelchair from home, pt tearful in triage.

## 2018-06-17 NOTE — Telephone Encounter (Signed)
Copied from CRM [id number]. Topic: Referral - Request >> Jun 17, 2018 12:32 PM Ronney Lion A wrote: Reason for CRM:  Patient called in very sad and tearful. she said she could definitely use a referral to a neurologist , says she wants nerve decompression surgery. Says her pain has went from moderate to severe over the past 22 months. She also stated she has contacted neurosurgery and they do not have any appointments avail until january. She says "I can't wait that long, I am in so much pain I can't even sleep in my bed". , she feels as though we just think "she just wants med's", but she would just like the surgery to be done.    Please advise

## 2018-06-17 NOTE — Telephone Encounter (Signed)
Called and left voicemail for patient that percocet was not discontinued, but there is just a longer wait time to get the medication due to elevated liver function, asked patient to call office to let us know if she picked up the skelaxin and if she has contacted neurosurgery. Also advised patient to go to the ED if she is still in discomfort. Okay for [facility] nurse to speak with patient about this matter.

## 2018-06-17 NOTE — Telephone Encounter (Signed)
Patient is in extreme pain. She was seen by Dr. Birdie Sons yesterday and he discontinued her percocet due to elevated liver functions. She states that she needs something. She was told to go to ED but patient states that no one is up in her house to take her and she needs her medication.

## 2018-06-17 NOTE — Telephone Encounter (Signed)
Patient went to the ED and was not seen she stated she left due to so many people in there that seemed to have flu. Patient is aware neurosurgery referral.

## 2018-06-17 NOTE — Telephone Encounter (Signed)
Recommendations?

## 2018-06-17 NOTE — ED Notes (Signed)
Patient left around

## 2018-06-17 NOTE — Telephone Encounter (Addendum)
Pt called with complaints of "all extreme back pain"; she says that her "back and hip are locked up"; unable to complete nurse triage because pt is irate; pt seen in office on 06/16/18;  she says that she did not pick up prescription for skelaxin because she did not know she had one; recommendations made per nurse triage protocol; pt states that when her husband wakes up she will have him take her to the ED; she would also like to be contacted regarding her chronic pain; she can be contacted at [phone]; spoke with Pat[place], [facility] office for notification of this encounter.   Reason for Disposition . [1] SEVERE abdominal pain AND [2] present > 1 hour  Answer Assessment - Initial Assessment Questions 1. ONSET: "When did the pain begin?"      22 months ago 2. LOCATION: "Where does it hurt?" (upper, mid or lower back)     lower 3. SEVERITY: "How bad is the pain?"  (e.g., Scale 1-10; mild, moderate, or severe)   - MILD (1-3): doesn't interfere with normal activities    - MODERATE (4-7): interferes with normal activities or awakens from sleep    - SEVERE (8-10): excruciating pain, unable to do any normal activities      severe 4. PATTERN: "Is the pain constant?" (e.g., yes, no; constant, intermittent)      constant 5. RADIATION: "Does the pain shoot into your legs or elsewhere?"     hip 6. CAUSE:  "What do you think is causing the back pain?"      Chronic back pain  7. BACK OVERUSE:  "Any recent lifting of heavy objects, strenuous work or exercise?"     no 8. MEDICATIONS: "What have you taken so far for the pain?" (e.g., nothing, acetaminophen, NSAIDS)     Previously took percocet  9. NEUROLOGIC SYMPTOMS: "Do you have any weakness, numbness, or problems with bowel/bladder control?"    Unable to assess 10. OTHER SYMPTOMS: "Do you have any other symptoms?" (e.g., fever, abdominal pain, burning with urination, blood in urine)       Unable to assess 11. PREGNANCY: "Is there  any chance you are pregnant?" (e.g., yes, no; LMP)       Unable to assess  Protocols used: BACK PAIN-A-AH

## 2018-06-17 NOTE — Telephone Encounter (Signed)
I am happy to refer her to neurology. It may take some time to get her in to see them so she should be aware of this. I will forward to [facility] to see if she can call [facility] regarding an appointment for the patient as it is for hospital follow-up and they saw her in the hospital.

## 2018-06-17 NOTE — Telephone Encounter (Signed)
  Reason for Disposition . Unable to walk  Protocols used: BACK PAIN-A-AH

## 2018-06-17 NOTE — Telephone Encounter (Signed)
You put the referral in for Neurology. Did you want Neurosurgery also or just Neurosurgery

## 2018-06-17 NOTE — ED Notes (Addendum)
Left approximately 1115, and I did not take off board. She said she decided not to wait.  Says pain is better after percocet.. She is calm appearing now.

## 2018-06-17 NOTE — Telephone Encounter (Signed)
I was reading Dr Kermit Balo note -- It does not say the percocet was discontinued, rather the amount of time in between doses was made longer due to elevated LFTs  She told triage RN she has not picked up skelaxin Rx that was sent yesterday  She was recently in hospital due to left leg weakness and extreme back pain. Has she contacted neurosurgery yet?  Thanks  LG

## 2018-06-17 NOTE — Telephone Encounter (Signed)
Copied from CRM #[id number]. Topic: Referral - Request >> Jun 17, 2018 12:32 PM Arrington, Shykila A wrote: Reason for CRM:  Patient called in very sad and tearful. she said she could definitely use a referral to a neurologist , says she wants nerve decompression surgery. Says her pain has went from moderate to severe over the past 22 months. She also stated she has contacted neurosurgery and they do not have any appointments avail until january. She says "I can't wait that long, I am in so much pain I can't even sleep in my bed". , she feels as though we just think "she just wants med's", but she would just like the surgery to be done.    Please advise 

## 2018-06-17 NOTE — Telephone Encounter (Signed)
After going over lab results with the pt she started to get really upset she stated thatshe is not worried about her lab results she is worried about her back. Pt stated that she is in so much pain and she requesting for something to be done. Pt suggested nerve decompression surgery or spinal fusion or back surgery.   Pt was very upset on the phone started to cry.

## 2018-06-17 NOTE — ED Triage Notes (Signed)
Says low back pain she has been to doctors for 22 months.  Says today was on toilet and lost feeling in both legs.  Then in lobby she coughed and lost feeling in left leg.  Patient is tearful.

## 2018-06-18 NOTE — Telephone Encounter (Signed)
I wanted both referrals.  I thought she has already been established with neurosurgery though I went ahead and placed a referral.  Please try to get these scheduled as soon is they are able to.  Dr. Marcell Barlow saw the patient in the hospital.

## 2018-06-18 NOTE — Telephone Encounter (Signed)
It has been sent to [facility] for Dr. Marcell Barlow

## 2018-06-18 NOTE — Telephone Encounter (Signed)
Noted. I understand her frustration. I have referred her to the neurosurgeon and a neurologist. We are going to work on getting these appointments set up as quickly as possible. We will have to see what they recommend doing once she see's them. It will be up to the surgeon to determine if any surgery is needed.

## 2018-06-19 ENCOUNTER — Encounter: Payer: Self-pay | Admitting: Family Medicine

## 2018-06-21 ENCOUNTER — Encounter: Payer: Self-pay | Admitting: Family Medicine

## 2018-06-21 NOTE — Telephone Encounter (Signed)
Called and spoke with pt. Pt advised and voiced understanding.  

## 2018-06-21 NOTE — Telephone Encounter (Signed)
Dr Birdie Sons sent in skelaxin

## 2018-06-22 ENCOUNTER — Other Ambulatory Visit: Payer: Self-pay | Admitting: Family Medicine

## 2018-06-22 ENCOUNTER — Encounter: Payer: Self-pay | Admitting: Family Medicine

## 2018-06-23 NOTE — Telephone Encounter (Signed)
Sent to PCP   Please advise   Last OV 06/16/2018   Last refilled 06/11/2018 disp 20 with no refills

## 2018-06-23 NOTE — Telephone Encounter (Signed)
rx request 

## 2018-06-23 NOTE — Telephone Encounter (Signed)
Sent to pharmacy.  Controlled substance database reviewed. 

## 2018-06-23 NOTE — Telephone Encounter (Signed)
Pt husband is calling and his wife is out of medication. I inform the husband it can take up to 3 business days

## 2018-06-24 NOTE — Telephone Encounter (Signed)
Message noted. I will forward to [facility] to check on the neurosurgeon referral. Were you able to get that appointment scheduled? Also, is there any way to get an earlier appointment with neurology? Could you call them and see about an earlier appointment? The patient likely needs nerve conduction studies due to leg weakness that was evaluated in the hospital. If needed I can contact Dr Sherryll Burger to discuss.

## 2018-07-14 ENCOUNTER — Encounter: Payer: Self-pay | Admitting: Family Medicine

## 2018-07-14 ENCOUNTER — Other Ambulatory Visit: Payer: Self-pay | Admitting: Family Medicine

## 2018-07-15 NOTE — Telephone Encounter (Signed)
Too early for this to be refilled.

## 2018-07-15 NOTE — Telephone Encounter (Signed)
Sent to [facility]  Last OV 06/16/2018   Last refilled 06/23/2018 disp 120 with no refills

## 2018-07-20 ENCOUNTER — Other Ambulatory Visit: Payer: Self-pay | Admitting: Family Medicine

## 2018-07-20 NOTE — Telephone Encounter (Unsigned)
Copied from CRM [id number]. Topic: Quick Communication - See Telephone Encounter >> Jul 20, 2018  9:18 AM Waymon Amato wrote: Pt is needing a refill on oxycodone   [facility]  Best number [phone]

## 2018-07-20 NOTE — Telephone Encounter (Signed)
Requested medication (s) are due for refill today: Due 07/24/18  Requested medication (s) are on the active medication list: yes    Last refill: 06/23/18  #120 0 refills  Future visit scheduled yes 07/28/18 Dr. Birdie Sons  Notes to clinic:Not delegated  Requested Prescriptions  Pending Prescriptions Disp Refills   oxyCODONE-acetaminophen (PERCOCET/ROXICET) 5-325 MG tablet 120 tablet 0    Sig: TAKE 1 TABLET BY MOUTH EVERY 6 HOURS AS NEEDED SEVERE PAIN     Not Delegated - Analgesics:  Opioid Agonist Combinations Failed - 07/20/2018 10:17 AM      Failed - This refill cannot be delegated      Passed - Urine Drug Screen completed in last 360 days.      Passed - Valid encounter within last 6 months    Recent Outpatient Visits          1 month ago Elevated LFTs   [facility] [place], Yehuda Mao, MD   3 months ago Type 2 diabetes mellitus without complication, with long-term current use of insulin [facility])   [facility] [place], Yehuda Mao, MD   6 months ago Type 2 diabetes mellitus without complication, with long-term current use of insulin [facility])   [facility] [place], Yehuda Mao, MD   6 months ago Type 2 diabetes mellitus without complication, with long-term current use of insulin [facility])   [facility] [place] Glori Luis, MD   7 months ago Chronic low back pain, unspecified back pain laterality, with sciatica presence unspecified   [facility] [place], Yehuda Mao, MD      Future Appointments            In 1 week Birdie Sons, Yehuda Mao, MD [facility]   In 2 months Birdie Sons, Yehuda Mao, MD [facility]

## 2018-07-21 MED ORDER — OXYCODONE-ACETAMINOPHEN 5-325 MG PO TABS: ORAL_TABLET | ORAL | 0 refills | Status: DC

## 2018-07-21 NOTE — Telephone Encounter (Signed)
Sent to PCP as an FYI in regards to the refill request that was sent to use from the pharmacy [facility]

## 2018-07-21 NOTE — Telephone Encounter (Signed)
Called and spoke with pt. Pt stated that she ran out of her medication 1 week ago.   Pt is due for her next refill on 11/2  Pt is completely out. Stated that she is in a lot of pain.   Plus she couldn't pick up her Rx on 11/2 due to it being on a Saturday. Pharmacy must be closed.   Sent to PCP please advise.

## 2018-07-21 NOTE — Telephone Encounter (Signed)
Husband calling back to advise he is asking the refill be sent to the pharmacy on Friday so pt can up the Rx on Sat. (when it is due) Their pharmacy, [facility], IS open on Saturday. Husband states he was just calling early in order for the pt to be able to pick up the Rx on time.  oxyCODONE-acetaminophen (PERCOCET/ROXICET) 5-325 MG tablet

## 2018-07-21 NOTE — Telephone Encounter (Signed)
Controlled substance database reviewed.  Medication was sent to the pharmacy to be filled on 07/24/2018.  This medication cannot be filled early.  She will need to keep her upcoming appointment and we can discuss this at her visit.

## 2018-07-23 ENCOUNTER — Telehealth: Payer: Self-pay | Admitting: Family Medicine

## 2018-07-23 NOTE — Telephone Encounter (Signed)
I sent the refill in to be filled tomorrow. The prior message stated that the pharmacy is open on Saturday. Please call the pharmacy and confirm that they are open on 07/24/18. If so she can get the refill tomorrow. If they are not open tomorrow it is ok for them to refill the percocet today. If she is having weakness in her legs she needs to be evaluated in the ED. If her numbness is worsening she needs to be evaluated in the ED. She should also call her neurosurgeon to determine if there is anything they can do to help. Thanks.

## 2018-07-23 NOTE — Telephone Encounter (Signed)
Called and spoke to patient's husband who wanted Dr. Birdie Sons to be aware of his wife's increased back pain. Pt's husband stated that his wife is c/o lower back pressure and numbness sensation down both LEGS. This is happening more frequently.  The right leg will go completely numb to where she can not feel nor move her right leg. The left leg seems to not be as bad as the right leg but will feel numb.   Sent to PCP   Where you able or willing to refill her Rx for today? Pharmacy is closed on Saturday for her pain medication.   Thanks

## 2018-07-23 NOTE — Telephone Encounter (Signed)
Copied from CRM [id number]. Topic: General - Other >> Jul 22, 2018  1:32 PM Terisa Starr wrote: Reason for CRM: Patient's husband called and wants the nurse to call him back in regards to the changes in her back. Call back @ [phone]

## 2018-07-23 NOTE — Telephone Encounter (Signed)
Called and spoke with patient's husband. Husband advised and voiced understanding. Does NOT plan to take his wife to ED he stated that they would not care for her in the manner the should because her issue would be consistor an emergency. Will follow up with neurosurgeon.   Called [facility] pharmacy they stated that he is open 7 days a week. Husband advised Rx was sent into pharmacy and patient can pick up Rx tomorrow.

## 2018-07-28 ENCOUNTER — Ambulatory Visit (INDEPENDENT_AMBULATORY_CARE_PROVIDER_SITE_OTHER): Payer: Self-pay | Admitting: Family Medicine

## 2018-07-28 ENCOUNTER — Encounter: Payer: Self-pay | Admitting: Family Medicine

## 2018-07-28 VITALS — BP 140/80 | HR 77 | Temp 98.4°F | Ht 67.0 in

## 2018-07-28 DIAGNOSIS — R945 Abnormal results of liver function studies: Secondary | ICD-10-CM

## 2018-07-28 DIAGNOSIS — Z794 Long term (current) use of insulin: Secondary | ICD-10-CM

## 2018-07-28 DIAGNOSIS — M545 Low back pain, unspecified: Secondary | ICD-10-CM

## 2018-07-28 DIAGNOSIS — G8929 Other chronic pain: Secondary | ICD-10-CM

## 2018-07-28 DIAGNOSIS — R030 Elevated blood-pressure reading, without diagnosis of hypertension: Secondary | ICD-10-CM

## 2018-07-28 DIAGNOSIS — K529 Noninfective gastroenteritis and colitis, unspecified: Secondary | ICD-10-CM

## 2018-07-28 DIAGNOSIS — E119 Type 2 diabetes mellitus without complications: Secondary | ICD-10-CM

## 2018-07-28 DIAGNOSIS — R7989 Other specified abnormal findings of blood chemistry: Secondary | ICD-10-CM

## 2018-07-28 DIAGNOSIS — F321 Major depressive disorder, single episode, moderate: Secondary | ICD-10-CM

## 2018-07-28 LAB — COMPREHENSIVE METABOLIC PANEL
ALBUMIN: 3.9 g/dL (ref 3.5–5.2)
ALK PHOS: 118 U/L — AB (ref 39–117)
ALT: 23 U/L (ref 0–35)
AST: 16 U/L (ref 0–37)
BUN: 6 mg/dL (ref 6–23)
CALCIUM: 9.3 mg/dL (ref 8.4–10.5)
CO2: 22 mEq/L (ref 19–32)
Chloride: 101 mEq/L (ref 96–112)
Creatinine, Ser: 0.56 mg/dL (ref 0.40–1.20)
GFR: 119.49 mL/min (ref >60.00)
Glucose, Bld: 278 mg/dL — ABNORMAL HIGH (ref 70–99)
Potassium: 3.9 mEq/L (ref 3.5–5.1)
Sodium: 132 mEq/L — ABNORMAL LOW (ref 135–145)
Total Bilirubin: 0.4 mg/dL (ref 0.2–1.2)
Total Protein: 7 g/dL (ref 6.0–8.3)

## 2018-07-28 NOTE — Patient Instructions (Signed)
Nice to see you. We will check on your Skelaxin prescription. Please continue to monitor your blood sugar and blood pressure. Please follow-up with neurosurgery and the pain specialist. Please continue to work on getting Medicaid. If you develop thoughts of harming yourself or others please seek medical attention immediately.

## 2018-07-28 NOTE — Progress Notes (Signed)
Tommi Rumps, MD Phone: [phone]  Laura Small is a 57 y.o. female who presents today for follow-up.  CC: Chronic back pain, diabetes, elevated blood pressure, ileitis, depression  Chronic back pain: Patient notes this has continued to be an issue.  She has gotten to the point where she has trouble sitting on a normal chair at times.  Feels as though there is a sac of fluid that builds up in her back and then squeezes.  She will go lay down and that will improve.  She now has pain radiating into her right leg as well as her left leg.  She has difficulty being mobile when she has pain in her legs.  She has numbness intermittently in bilateral legs.  All of this has been going on for 3 to 4 months now.  He has seen the neurosurgeon and she notes they advised her they could perform surgery though that she might not like the outcome.  She also saw the pain specialist to consider spinal cord stimulator and she sees them again in December.  She does not have insurance.  They are working on getting her Medicaid.  She continues on Percocet.  She takes it every 6 hours.  No drowsiness.  No alcohol intake.  She was taking Skelaxin though she has not been able to get this prescription.  She notes the gabapentin was not helpful.  Vicodin was not helpful.  She has had hand and foot swelling with NSAIDs and notes she possibly had tongue swelling with NSAIDs.  Diabetes: Typically running less than 150.  She takes Lantus 20-40 units based on the previous days blood sugars.  She will take around 20 units if it is 130 and if it gets up close to 150 she will take 30 to 40 units.  No polyuria or polydipsia.  No hypoglycemia.  Elevated blood pressure: Typically 097-353 systolically at home when she is not hurting.  She notes no chest pain or shortness of breath.  Depression: Patient notes her depression is there.  She previously stopped the Cymbalta as she felt this was just for her legs.  She notes when  she is able to get up and move around she is happy.  She is doing crafts to help with this.  She notes no SI.  She does note she is upset with some of the other physicians that she has seen and the answers that she has gotten though she denies any HI.  Social History   Tobacco Use  Smoking Status Current Every Day Smoker  . Packs/day: 0.00  . Types: Cigarettes  Smokeless Tobacco Never Used     ROS see history of present illness  Objective  Physical Exam Vitals:   07/28/18 1312  BP: 140/80  Pulse: 77  Temp: 98.4 F (36.9 C)  SpO2: 98%    BP Readings from Last 3 Encounters:  07/28/18 140/80  06/17/18 (!) 164/100  06/16/18 118/72   Wt Readings from Last 3 Encounters:  06/17/18 220 lb (99.8 kg)  06/11/18 214 lb 6.4 oz (97.3 kg)  01/13/18 227 lb 3.2 oz (103.1 kg)    Physical Exam  Constitutional: No distress.  Cardiovascular: Normal rate, regular rhythm and normal heart sounds.  Pulmonary/Chest: Effort normal and breath sounds normal.  Musculoskeletal: She exhibits no edema.  Neurological: She is alert.  5/5 strength bilateral quads, hamstrings, and plantar flexion, sensation light touch decreased mid shins down  Skin: Skin is warm and dry. She is not  diaphoretic.     Assessment/Plan: Please see individual problem list.  Ileitis I discussed this again with the patient.  I advised her that she should see GI though she wants to wait given financial constraints and wait until her back issue has been resolved.  DM (diabetes mellitus), type 2 ([place]) Seems to be well controlled.  She will continue her current regimen.  Plan to recheck A1c at her visit in December.  Chronic low back pain Patient continues to have issues with this.  I encouraged her to follow-up with pain management and her neurosurgeon.  She will work on getting Medicaid as her lack of insurance is limiting her options for treatment.  We will continue Percocet and Skelaxin.  She was advised not to take  more Percocet than prescribed.  We will recheck her liver function given that it was slightly elevated after her most recent hospitalization.  Depression Does have some depressive symptoms.  No SI or HI.  Discussed starting back on medication though she has declined this.  She is unable to afford a therapist.  She will continue to do things that make her happy at home such as crafts and school.  If she has worsening symptoms they will let us know.  If she develops SI or HI she will go to the emergency department.  Elevated BP without diagnosis of hypertension Suspect this has been elevated related to pain.  She will continue to monitor at home.  Elevated LFTs Recheck CMP today.    Orders Placed This Encounter  Procedures  . Comp Met (CMET)    No orders of the defined types were placed in this encounter.    Tommi Rumps, MD [place]

## 2018-07-28 NOTE — Assessment & Plan Note (Addendum)
Patient continues to have issues with this.  I encouraged her to follow-up with pain management and her neurosurgeon.  She will work on getting Medicaid as her lack of insurance is limiting her options for treatment.  We will continue Percocet and Skelaxin.  She was advised not to take more Percocet than prescribed.  We will recheck her liver function given that it was slightly elevated after her most recent hospitalization.

## 2018-07-28 NOTE — Assessment & Plan Note (Signed)
Does have some depressive symptoms.  No SI or HI.  Discussed starting back on medication though she has declined this.  She is unable to afford a therapist.  She will continue to do things that make her happy at home such as crafts and school.  If she has worsening symptoms they will let us know.  If she develops SI or HI she will go to the emergency department.

## 2018-07-28 NOTE — Assessment & Plan Note (Signed)
Seems to be well controlled.  She will continue her current regimen.  Plan to recheck A1c at her visit in December.

## 2018-07-28 NOTE — Assessment & Plan Note (Signed)
Suspect this has been elevated related to pain.  She will continue to monitor at home.

## 2018-07-28 NOTE — Assessment & Plan Note (Signed)
I discussed this again with the patient.  I advised her that she should see GI though she wants to wait given financial constraints and wait until her back issue has been resolved.

## 2018-07-28 NOTE — Assessment & Plan Note (Signed)
Recheck CMP today.  °

## 2018-08-05 ENCOUNTER — Other Ambulatory Visit: Payer: Self-pay | Admitting: Family Medicine

## 2018-08-05 DIAGNOSIS — R748 Abnormal levels of other serum enzymes: Secondary | ICD-10-CM

## 2018-08-17 ENCOUNTER — Other Ambulatory Visit: Payer: Self-pay | Admitting: Family Medicine

## 2018-08-17 NOTE — Telephone Encounter (Signed)
Copied from CRM [id number]. Topic: Quick Communication - Rx Refill/Question >> Aug 17, 2018  3:02 PM Gerrianne ScalePayne, Davonne Jarnigan L wrote: Medication: oxyCODONE-acetaminophen (PERCOCET/ROXICET) 5-325 MG tablet   medicine not due until 08-23-18  Has the patient contacted their pharmacy? Yes.  Tranfer pt husband (Agent: If no, request that the patient contact the pharmacy for the refill.) (Agent: If yes, when and what did the pharmacy advise?)  Preferred Pharmacy (with phone number or street name): [facility] - [place], [place] - [address] [phone] (Phone) [phone] (Fax)    Agent: Please be advised that RX refills may take up to 3 business days. We ask that you follow-up with your pharmacy.

## 2018-08-18 NOTE — Telephone Encounter (Signed)
Controlled substance database reviewed. Sent to pharmacy.   

## 2018-08-21 ENCOUNTER — Other Ambulatory Visit: Payer: Self-pay

## 2018-08-21 ENCOUNTER — Emergency Department
Admission: EM | Admit: 2018-08-21 | Discharge: 2018-08-21 | Disposition: A | Discharge status: Home / Self Care | Payer: Self-pay | Attending: Student in an Organized Health Care Education/Training Program | Admitting: Student in an Organized Health Care Education/Training Program

## 2018-08-21 ENCOUNTER — Emergency Department: Payer: Self-pay

## 2018-08-21 ENCOUNTER — Encounter: Payer: Self-pay | Admitting: Emergency Medicine

## 2018-08-21 DIAGNOSIS — G8929 Other chronic pain: Secondary | ICD-10-CM

## 2018-08-21 DIAGNOSIS — M5442 Lumbago with sciatica, left side: Secondary | ICD-10-CM

## 2018-08-21 DIAGNOSIS — F17210 Nicotine dependence, cigarettes, uncomplicated: Secondary | ICD-10-CM | POA: Insufficient documentation

## 2018-08-21 DIAGNOSIS — Z79899 Other long term (current) drug therapy: Secondary | ICD-10-CM | POA: Insufficient documentation

## 2018-08-21 DIAGNOSIS — M5432 Sciatica, left side: Secondary | ICD-10-CM | POA: Insufficient documentation

## 2018-08-21 DIAGNOSIS — Z794 Long term (current) use of insulin: Secondary | ICD-10-CM | POA: Insufficient documentation

## 2018-08-21 DIAGNOSIS — E119 Type 2 diabetes mellitus without complications: Secondary | ICD-10-CM | POA: Insufficient documentation

## 2018-08-21 DIAGNOSIS — R1084 Generalized abdominal pain: Secondary | ICD-10-CM | POA: Insufficient documentation

## 2018-08-21 DIAGNOSIS — Z7982 Long term (current) use of aspirin: Secondary | ICD-10-CM | POA: Insufficient documentation

## 2018-08-21 LAB — CBC WITH DIFFERENTIAL/PLATELET
ABS IMMATURE GRANULOCYTES: 0.09 10*3/uL — AB (ref 0.00–0.07)
BASOS PCT: 0 %
Basophils Absolute: 0.1 10*3/uL (ref 0.0–0.1)
EOS ABS: 0.1 10*3/uL (ref 0.0–0.5)
Eosinophils Relative: 1 %
HEMATOCRIT: 50.3 % — AB (ref 36.0–46.0)
Hemoglobin: 17.3 g/dL — ABNORMAL HIGH (ref 12.0–15.0)
Immature Granulocytes: 1 %
LYMPHS ABS: 3.5 10*3/uL (ref 0.7–4.0)
Lymphocytes Relative: 24 %
MCH: 29.5 pg (ref 26.0–34.0)
MCHC: 34.4 g/dL (ref 30.0–36.0)
MCV: 85.7 fL (ref 80.0–100.0)
MONO ABS: 0.7 10*3/uL (ref 0.1–1.0)
MONOS PCT: 5 %
Neutro Abs: 10.4 10*3/uL — ABNORMAL HIGH (ref 1.7–7.7)
Neutrophils Relative %: 69 %
PLATELETS: 464 10*3/uL — AB (ref 150–400)
RBC: 5.87 MIL/uL — ABNORMAL HIGH (ref 3.87–5.11)
RDW: 12.4 % (ref 11.5–15.5)
WBC: 14.8 10*3/uL — ABNORMAL HIGH (ref 4.0–10.5)
nRBC: 0 % (ref 0.0–0.2)

## 2018-08-21 LAB — URINALYSIS, COMPLETE (UACMP) WITH MICROSCOPIC
BACTERIA UA: NONE SEEN
BILIRUBIN URINE: NEGATIVE
Glucose, UA: NEGATIVE mg/dL
Hgb urine dipstick: NEGATIVE
Ketones, ur: 5 mg/dL — AB
Leukocytes, UA: NEGATIVE
NITRITE: NEGATIVE
Protein, ur: NEGATIVE mg/dL
Specific Gravity, Urine: >1.046 — ABNORMAL HIGH (ref 1.005–1.030)
pH: 5 (ref 5.0–8.0)

## 2018-08-21 LAB — COMPREHENSIVE METABOLIC PANEL
ALBUMIN: 4 g/dL (ref 3.5–5.0)
ALT: 18 U/L (ref 0–44)
ANION GAP: 15 (ref 5–15)
AST: 35 U/L (ref 15–41)
Alkaline Phosphatase: 110 U/L (ref 38–126)
BUN: 13 mg/dL (ref 6–20)
CALCIUM: 9.5 mg/dL (ref 8.9–10.3)
CO2: 17 mmol/L — ABNORMAL LOW (ref 22–32)
CREATININE: 0.66 mg/dL (ref 0.44–1.00)
Chloride: 103 mmol/L (ref 98–111)
GFR calc Af Amer: >60 mL/min (ref >60)
GFR calc non Af Amer: >60 mL/min (ref >60)
GLUCOSE: 307 mg/dL — AB (ref 70–99)
POTASSIUM: 3.7 mmol/L (ref 3.5–5.1)
SODIUM: 135 mmol/L (ref 135–145)
Total Bilirubin: 1.2 mg/dL (ref 0.3–1.2)
Total Protein: 7.8 g/dL (ref 6.5–8.1)

## 2018-08-21 LAB — TROPONIN I

## 2018-08-21 LAB — LACTIC ACID, PLASMA
LACTIC ACID, VENOUS: 1.6 mmol/L (ref 0.5–1.9)
Lactic Acid, Venous: 3.8 mmol/L (ref 0.5–1.9)

## 2018-08-21 LAB — LIPASE, BLOOD: LIPASE: 37 U/L (ref 11–51)

## 2018-08-21 LAB — I-STAT BETA HCG BLOOD, ED (NOT ORDERABLE): I-stat hCG, quantitative: <5 m[IU]/mL (ref <5)

## 2018-08-21 MED ORDER — IOPAMIDOL (ISOVUE-370) INJECTION 76%
100.0000 mL | Freq: Once | INTRAVENOUS | Status: AC | PRN
  Administered 2018-08-21: 100 mL via INTRAVENOUS

## 2018-08-21 MED ORDER — SODIUM CHLORIDE 0.9 % IV BOLUS
500.0000 mL | Freq: Once | INTRAVENOUS | Status: AC
  Administered 2018-08-21: 500 mL via INTRAVENOUS

## 2018-08-21 MED ORDER — PROMETHAZINE HCL 25 MG/ML IJ SOLN: 12.5000 mg | Freq: Four times a day (QID) | INTRAMUSCULAR | Status: DC | PRN

## 2018-08-21 MED ORDER — PROCHLORPERAZINE MALEATE 10 MG PO TABS: 10.0000 mg | ORAL_TABLET | Freq: Four times a day (QID) | ORAL | 0 refills | Status: DC | PRN

## 2018-08-21 MED ORDER — HALOPERIDOL LACTATE 5 MG/ML IJ SOLN
5.0000 mg | Freq: Once | INTRAMUSCULAR | Status: AC
  Administered 2018-08-21: 5 mg via INTRAVENOUS
  Filled 2018-08-21: qty 1

## 2018-08-21 MED ORDER — SODIUM CHLORIDE 0.9 % IV BOLUS
1000.0000 mL | Freq: Once | INTRAVENOUS | Status: AC
  Administered 2018-08-21: 1000 mL via INTRAVENOUS

## 2018-08-21 MED ORDER — MORPHINE SULFATE (PF) 4 MG/ML IV SOLN: 4.0000 mg | INTRAVENOUS | Status: DC | PRN

## 2018-08-21 NOTE — ED Provider Notes (Signed)
[facility] Emergency Department Provider Note    First MD Initiated Contact with Patient 08/21/18 1647     (approximate)  I have reviewed the triage vital signs and the nursing notes.   HISTORY  Chief Complaint Abdominal Pain    HPI Laura Small is a 57 y.o. female with a history of chronic back pain and sciatica presents the ER with acute onset epigastric and lower abdominal pain starting around 5 or 6:00 this morning did suddenly exacerbated her sciatica to the point where she is unable to ambulate or get out of the chair.  Patient arrives screaming and crying.  On proper cooperative with exam and unwilling to provide any additional history.  States that she has post cholecystectomy.   Past Medical History:  Diagnosis Date  . Back pain   . Chicken pox   . Diabetes mellitus without complication (HCC)   . DJD (degenerative joint disease)   . Migraines   . PONV (postoperative nausea and vomiting)    usually has 1 episode of vomiting within 1 hour of anesthesia    Family History  Problem Relation Age of Onset  . Diabetes Mother   . Gallbladder disease Mother   . Heart disease Father   . Alcohol abuse Father   . Gallbladder disease Father   . Heart disease Paternal Grandmother   . Colon cancer Neg Hx    Past Surgical History:  Procedure Laterality Date  . BACK SURGERY    . CHOLECYSTECTOMY    . INSERTION OF MESH N/A 02/12/2015   Procedure: INSERTION OF MESH;  Surgeon: Natale Lay, MD;  Location: [facility] ORS;  Service: General;  Laterality: N/A;  . VENTRAL HERNIA REPAIR N/A 02/12/2015   Procedure: HERNIA REPAIR VENTRAL ADULT;  Surgeon: Natale Lay, MD;  Location: [facility] ORS;  Service: General;  Laterality: N/A;   Patient Active Problem List   Diagnosis Date Noted  . Elevated LFTs 06/16/2018  . Lower extremity weakness 06/10/2018  . Elevated BP without diagnosis of hypertension 04/14/2018  . Depression 10/12/2017  . Muscle spasms of both  lower extremities 10/12/2017  . Aortic atherosclerosis (HCC) 01/08/2017  . Tobacco abuse 01/08/2017  . Ileitis 01/08/2017  . DM (diabetes mellitus), type 2 (HCC) 09/23/2016  . Hyperlipidemia 09/23/2016  . Chronic low back pain 09/08/2016      Prior to Admission medications   Medication Sig Start Date End Date Taking? Authorizing Provider  aspirin EC 81 MG tablet Take 1 tablet (81 mg total) by mouth daily. 10/15/17   Pucilowska, Jolanta B, MD  dexamethasone (DECADRON) 4 MG tablet Two tab po day 1, 2;  1 tab po day3,4; 1/2 tab po day5,6, 7,8 06/11/18   Renae Gloss, Richard, MD  Insulin Glargine (BASAGLAR KWIKPEN) 100 UNIT/ML SOPN Inject into the skin. 10/15/17   [provider]  Insulin Pen Needle 32G X 6 MM MISC Use as directed for once daily insulin. E11.9 06/11/18   Alford Highland, MD  metaxalone (SKELAXIN) 800 MG tablet Take 1 tablet (800 mg total) by mouth 4 (four) times daily. 06/16/18   Glori Luis, MD  oxyCODONE-acetaminophen (PERCOCET/ROXICET) 5-325 MG tablet TAKE 1 TABLET BY MOUTH EVERY 6 HOURS AS NEEDED SEVERE PAIN 08/23/18   Glori Luis, MD  potassium chloride SA (K-DUR,KLOR-CON) 20 MEQ tablet Take 2 tablets (40 mEq total) by mouth daily. 06/16/18   Glori Luis, MD    Allergies Bee venom; Advil [ibuprofen]; Jardiance [empagliflozin]; Nitroglycerin; and Prednisone  Social History Social History   Tobacco Use  . Smoking status: Current Every Day Smoker    Packs/day: 0.00    Types: Cigarettes  . Smokeless tobacco: Never Used  Substance Use Topics  . Alcohol use: No  . Drug use: No    Review of Systems Patient denies headaches, rhinorrhea, blurry vision, numbness, shortness of breath, chest pain, edema, cough, abdominal pain, nausea, vomiting, diarrhea, dysuria, fevers, rashes or hallucinations unless otherwise stated above in HPI. ____________________________________________   PHYSICAL EXAM:  VITAL SIGNS: Vitals:   08/21/18 1643  BP: (!)  136/98  Pulse: (!) 127  Resp: (!) 22  Temp: 98.3 F (36.8 C)  SpO2: 100%    Constitutional: Alert but poorly kempt crying and screaming in pain.  Patient unwilling to get into ER bed Eyes: Conjunctivae are normal.  Head: Atraumatic. Nose: No congestion/rhinnorhea. Mouth/Throat: Mucous membranes are moist.  Poor dentition Neck: No stridor. Painless ROM.  Cardiovascular: Normal rate, regular rhythm. Grossly normal heart sounds.  Good peripheral circulation. Respiratory: Normal respiratory effort.  No retractions. Lungs CTAB. Gastrointestinal: Soft, obese with TTP in RLQ. No distention. No abdominal bruits. No CVA tenderness. Genitourinary: deferred Musculoskeletal: No lower extremity tenderness nor edema.  No joint effusions. Neurologic:  Normal speech and language. No gross focal neurologic deficits are appreciated. No facial droop Skin:  Skin is warm, dry and intact. No rash noted. Psychiatric: Mood and affect are anxious  ____________________________________________   LABS (all labs ordered are listed, but only abnormal results are displayed)  Results for orders placed or performed during the hospital encounter of 08/21/18 (from the past 24 hour(s))  CBC with Differential/Platelet     Status: Abnormal   Collection Time: 08/21/18  5:20 PM  Result Value Ref Range   WBC 14.8 (H) 4.0 - 10.5 K/uL   RBC 5.87 (H) 3.87 - 5.11 MIL/uL   Hemoglobin 17.3 (H) 12.0 - 15.0 g/dL   HCT 40.950.3 (H) 81.136.0 - 91.446.0 %   MCV 85.7 80.0 - 100.0 fL   MCH 29.5 26.0 - 34.0 pg   MCHC 34.4 30.0 - 36.0 g/dL   RDW 78.212.4 95.611.5 - 21.315.5 %   Platelets 464 (H) 150 - 400 K/uL   nRBC 0.0 0.0 - 0.2 %   Neutrophils Relative % 69 %   Neutro Abs 10.4 (H) 1.7 - 7.7 K/uL   Lymphocytes Relative 24 %   Lymphs Abs 3.5 0.7 - 4.0 K/uL   Monocytes Relative 5 %   Monocytes Absolute 0.7 0.1 - 1.0 K/uL   Eosinophils Relative 1 %   Eosinophils Absolute 0.1 0.0 - 0.5 K/uL   Basophils Relative 0 %   Basophils Absolute 0.1 0.0 -  0.1 K/uL   Immature Granulocytes 1 %   Abs Immature Granulocytes 0.09 (H) 0.00 - 0.07 K/uL  Comprehensive metabolic panel     Status: Abnormal   Collection Time: 08/21/18  5:20 PM  Result Value Ref Range   Sodium 135 135 - 145 mmol/L   Potassium 3.7 3.5 - 5.1 mmol/L   Chloride 103 98 - 111 mmol/L   CO2 17 (L) 22 - 32 mmol/L   Glucose, Bld 307 (H) 70 - 99 mg/dL   BUN 13 6 - 20 mg/dL   Creatinine, Ser 0.860.66 0.44 - 1.00 mg/dL   Calcium 9.5 8.9 - 57.810.3 mg/dL   Total Protein 7.8 6.5 - 8.1 g/dL   Albumin 4.0 3.5 - 5.0 g/dL   AST 35 15 - 41 U/L  ALT 18 0 - 44 U/L   Alkaline Phosphatase 110 38 - 126 U/L   Total Bilirubin 1.2 0.3 - 1.2 mg/dL   GFR calc non Af Amer >60 >60 mL/min   GFR calc Af Amer >60 >60 mL/min   Anion gap 15 5 - 15  Lipase, blood     Status: None   Collection Time: 08/21/18  5:20 PM  Result Value Ref Range   Lipase 37 11 - 51 U/L  Troponin I - Add-On to previous collection     Status: None   Collection Time: 08/21/18  5:20 PM  Result Value Ref Range   Troponin I <0.03 <0.03 ng/mL  Lactic acid, plasma     Status: Abnormal   Collection Time: 08/21/18  5:23 PM  Result Value Ref Range   Lactic Acid, Venous 3.8 (HH) 0.5 - 1.9 mmol/L  I-Stat beta hCG blood, ED     Status: None   Collection Time: 08/21/18  5:54 PM  Result Value Ref Range   I-stat hCG, quantitative <5.0 <5 mIU/mL   Comment 3          Lactic acid, plasma     Status: None   Collection Time: 08/21/18  8:58 PM  Result Value Ref Range   Lactic Acid, Venous 1.6 0.5 - 1.9 mmol/L  Urinalysis, Complete w Microscopic     Status: Abnormal   Collection Time: 08/21/18  9:03 PM  Result Value Ref Range   Color, Urine YELLOW (A) YELLOW   APPearance CLEAR (A) CLEAR   Specific Gravity, Urine >1.046 (H) 1.005 - 1.030   pH 5.0 5.0 - 8.0   Glucose, UA NEGATIVE NEGATIVE mg/dL   Hgb urine dipstick NEGATIVE NEGATIVE   Bilirubin Urine NEGATIVE NEGATIVE   Ketones, ur 5 (A) NEGATIVE mg/dL   Protein, ur NEGATIVE NEGATIVE  mg/dL   Nitrite NEGATIVE NEGATIVE   Leukocytes, UA NEGATIVE NEGATIVE   RBC / HPF 11-20 0 - 5 RBC/hpf   WBC, UA 0-5 0 - 5 WBC/hpf   Bacteria, UA NONE SEEN NONE SEEN   Squamous Epithelial / LPF 11-20 0 - 5   ____________________________________________  EKG My review and personal interpretation at Time: 17:13   Indication: back pain  Rate: 115  Rhythm: sinus Axis: normal Other: nonsepcific st abn, no stemi criteria, poor r wave progression ____________________________________________  RADIOLOGY  I personally reviewed all radiographic images ordered to evaluate for the above acute complaints and reviewed radiology reports and findings.  These findings were personally discussed with the patient.  Please see medical record for radiology report.  ____________________________________________   PROCEDURES  Procedure(s) performed:  Procedures    Critical Care performed: no ____________________________________________   INITIAL IMPRESSION / ASSESSMENT AND PLAN / ED COURSE  Pertinent labs & imaging results that were available during my care of the patient were reviewed by me and considered in my medical decision making (see chart for details).   DDX: AAA, perforation, colitis, appy, uti, stone  Laura Small is a 57 y.o. who presents to the ED with symptoms as described above.  Patient clearly uncomfortable very anxious and unable to provide any additional history.  Found to be tachycardic mildly hypertensive likely secondary to pain.  Based on presentation will give IV pain medication, IV antiemetic, check blood work in order CT angiogram to exclude ruptured aneurysm or dissection as she does have a history of atherosclerosis.  Clinical Course as of Aug 21 2153  Sat Aug 21, 2018  1815  Lactate is elevated to 3.8.  Will give additional IV fluids and reassess.  Blood work does show significant hemoconcentration.  May be secondary to primary dehydrated state she is not  febrile.   [PR]  1936 CT shows no evidence of perforation and acute appendicitis, abscess or SBO.  Patient's pain currently controlled.  Does endorse decreased oral intake and has not eaten anything since yesterday.  Still awaiting urinalysis, will repeat lactate.   [PR]  2043 Trop negative.   [PR]  2043 Still awaiting UA    [PR]  2101 Patient requesting something to drink.   [PR]  2136 Repeat lactic normalized.  Patient tolerating oral hydration.  Do have high suspicion for viral enteritis.  Pain controlled.  Still awaiting urinalysis to exclude infectious process.   [PR]  2149 Patient tolerating oral hydration.  She is requesting discharge home.  I encouraged the patient stay for additional observation reassessment and even admission the hospital for additional IV fluids.  She is declining this at this time states that she would prefer to go home and sleep.  Repeat abdominal exam is soft and benign.  She does not have any abdominal tenderness.  No chest pain shortness of breath.  Pain is currently controlled.  At this point believe she stable for trial of outpatient management but I have given the patient strict report return precautions that she should return immediately if pain worsens or she is unable to keep any fluids down.   [PR]    Clinical Course User Index [PR] Willy Eddy, MD     As part of my medical decision making, I reviewed the following data within the electronic MEDICAL RECORD NUMBER Nursing notes reviewed and incorporated, Labs reviewed, notes from prior ED visits and [place] Controlled Substance Database   ____________________________________________   FINAL CLINICAL IMPRESSION(S) / ED DIAGNOSES  Final diagnoses:  Generalized abdominal pain  Chronic left-sided low back pain with left-sided sciatica      NEW MEDICATIONS STARTED DURING THIS VISIT:  New Prescriptions   No medications on file     Note:  This document was prepared using Dragon voice recognition  software and may include unintentional dictation errors.    Willy Eddy, MD 08/21/18 2155

## 2018-08-21 NOTE — ED Triage Notes (Signed)
Pt to ED via POV c/o abdominal pain that started between 0500-0600, pt denies N/V/D. Pt states that the pain got much worse after using the restroom around 1500. Pt states that she is having nerve pain. Pt is yelling out and crying in triage.

## 2018-08-21 NOTE — Discharge Instructions (Addendum)

## 2018-08-21 NOTE — ED Notes (Signed)
Pt was given a cup of water ok'd by provider. Pt was informed that a urine sample is needed, pt is up to toilet at this time.

## 2018-08-21 NOTE — ED Notes (Signed)
Pt requesting water. MD waiting for test results. Verbal order given for additional NS Bolus.

## 2018-08-25 ENCOUNTER — Telehealth: Payer: Self-pay | Admitting: Radiology

## 2018-08-25 ENCOUNTER — Telehealth: Payer: Self-pay | Admitting: Family Medicine

## 2018-08-25 ENCOUNTER — Other Ambulatory Visit (INDEPENDENT_AMBULATORY_CARE_PROVIDER_SITE_OTHER): Payer: Self-pay

## 2018-08-25 DIAGNOSIS — E876 Hypokalemia: Secondary | ICD-10-CM

## 2018-08-25 DIAGNOSIS — R748 Abnormal levels of other serum enzymes: Secondary | ICD-10-CM

## 2018-08-25 NOTE — Telephone Encounter (Signed)
Sent to PCP to advise. Thanks  

## 2018-08-25 NOTE — Telephone Encounter (Signed)
It does not appear that this was prescribed in the ED based on review of the chart.  She will need follow-up in the office to determine if this is appropriate treatment.  Thanks.

## 2018-08-25 NOTE — Telephone Encounter (Signed)
Pt is requesting a refill on 30 Ondansetron ODT 4mg  OD. She received  30 tablets supply when at the ED, it really helps for her upset stomach. Please let her know if Dr. Birdie SonsSonnenberg will fill for her.

## 2018-08-25 NOTE — Telephone Encounter (Signed)
PT came in for labs today, potassium order was placed back in September, would you like pt to have this ran with other labs?

## 2018-08-25 NOTE — Telephone Encounter (Signed)
Please disregard prior. Reviewed result note from 07/30/18 and verified to only run alkaline phosphatase labs. Potassium order has been cancelled.

## 2018-08-26 LAB — HEPATIC FUNCTION PANEL
ALK PHOS: 110 U/L (ref 39–117)
ALT: 79 U/L — AB (ref 0–35)
AST: 68 U/L — AB (ref 0–37)
Albumin: 4 g/dL (ref 3.5–5.2)
BILIRUBIN TOTAL: 0.7 mg/dL (ref 0.2–1.2)
Bilirubin, Direct: 0.2 mg/dL (ref 0.0–0.3)
TOTAL PROTEIN: 6.6 g/dL (ref 6.0–8.3)

## 2018-08-26 LAB — GAMMA GT: GGT: 61 U/L — AB (ref 7–51)

## 2018-08-31 NOTE — Telephone Encounter (Signed)
Called patient and left a VM to call back. CRM created and sent to [facility] pool.  

## 2018-09-01 ENCOUNTER — Telehealth: Payer: Self-pay | Admitting: *Deleted

## 2018-09-01 NOTE — Telephone Encounter (Signed)
Called patient and left a VM to call back.  

## 2018-09-01 NOTE — Telephone Encounter (Signed)
[facility]. She voiced dissatisfaction with receiving messages for callback and stated she "had no idea what the call was about". Very rude with her words and threatened to get "Irate with me". Communicated to her I would speak with [facility] regarding the call. TC to shelby around 4:45p-She will call patient regarding her message left to patient.

## 2018-09-01 NOTE — Telephone Encounter (Signed)
Called patient and left a detailed VM in regards to the medication ondansetron did advise pt that PCP is not willing to refill Rx without an appt to review if this medication is medical needed or not. [facility] did leave my direct number as well [phone] .

## 2018-09-02 ENCOUNTER — Other Ambulatory Visit: Payer: Self-pay | Admitting: Family Medicine

## 2018-09-02 DIAGNOSIS — R945 Abnormal results of liver function studies: Principal | ICD-10-CM

## 2018-09-02 DIAGNOSIS — R7989 Other specified abnormal findings of blood chemistry: Secondary | ICD-10-CM

## 2018-09-06 NOTE — Telephone Encounter (Signed)
Advised patient to schedule appt to get med she stated she would like to just keep the appt that is scheduled for 09/20/2018

## 2018-09-06 NOTE — Telephone Encounter (Signed)
Called patient and left a VM to call back.  

## 2018-09-15 ENCOUNTER — Other Ambulatory Visit: Payer: Self-pay

## 2018-09-15 ENCOUNTER — Emergency Department
Admission: EM | Admit: 2018-09-15 | Discharge: 2018-09-15 | Disposition: A | Discharge status: Home / Self Care | Payer: Self-pay | Attending: Emergency Medicine | Admitting: Emergency Medicine

## 2018-09-15 DIAGNOSIS — R109 Unspecified abdominal pain: Secondary | ICD-10-CM | POA: Insufficient documentation

## 2018-09-15 DIAGNOSIS — Z5321 Procedure and treatment not carried out due to patient leaving prior to being seen by health care provider: Secondary | ICD-10-CM | POA: Insufficient documentation

## 2018-09-15 DIAGNOSIS — M549 Dorsalgia, unspecified: Secondary | ICD-10-CM | POA: Insufficient documentation

## 2018-09-15 LAB — COMPREHENSIVE METABOLIC PANEL
ALT: 43 U/L (ref 0–44)
AST: 50 U/L — AB (ref 15–41)
Albumin: 4.5 g/dL (ref 3.5–5.0)
Alkaline Phosphatase: 115 U/L (ref 38–126)
Anion gap: 14 (ref 5–15)
BUN: 8 mg/dL (ref 6–20)
CO2: 16 mmol/L — ABNORMAL LOW (ref 22–32)
Calcium: 9.9 mg/dL (ref 8.9–10.3)
Chloride: 106 mmol/L (ref 98–111)
Creatinine, Ser: 0.57 mg/dL (ref 0.44–1.00)
GFR calc Af Amer: >60 mL/min (ref >60)
GFR calc non Af Amer: >60 mL/min (ref >60)
Glucose, Bld: 206 mg/dL — ABNORMAL HIGH (ref 70–99)
POTASSIUM: 4.1 mmol/L (ref 3.5–5.1)
Sodium: 136 mmol/L (ref 135–145)
Total Bilirubin: 1 mg/dL (ref 0.3–1.2)
Total Protein: 8.2 g/dL — ABNORMAL HIGH (ref 6.5–8.1)

## 2018-09-15 LAB — LIPASE, BLOOD: Lipase: 29 U/L (ref 11–51)

## 2018-09-15 LAB — CBC
HCT: 51.7 % — ABNORMAL HIGH (ref 36.0–46.0)
Hemoglobin: 17.7 g/dL — ABNORMAL HIGH (ref 12.0–15.0)
MCH: 29.2 pg (ref 26.0–34.0)
MCHC: 34.2 g/dL (ref 30.0–36.0)
MCV: 85.3 fL (ref 80.0–100.0)
Platelets: 431 10*3/uL — ABNORMAL HIGH (ref 150–400)
RBC: 6.06 MIL/uL — ABNORMAL HIGH (ref 3.87–5.11)
RDW: 12.8 % (ref 11.5–15.5)
WBC: 16.9 10*3/uL — ABNORMAL HIGH (ref 4.0–10.5)
nRBC: 0 % (ref 0.0–0.2)

## 2018-09-15 NOTE — ED Triage Notes (Addendum)
Lower abd pain that radiates to low back. Husband states that pt has compressed nerves in back. Pain x 2 hours. Denies urinary symptoms. Pt holding lower abd. Husband states that pt described pain earlier as "an egg beater inside of her."

## 2018-09-15 NOTE — ED Notes (Signed)
States leg weakness, noted to move both legs. States has bilateral sciatica which gives her leg weakness.

## 2018-09-15 NOTE — ED Notes (Signed)
Patients husband wheeling out and states they are leaving. Patient does not appear in distress at this time. Assured we would like them to stay and be seen when bed available however states they are leaving anyway.

## 2018-09-15 NOTE — ED Notes (Signed)
When pt placed in subwait she was hyperventilating. Pt refusing to slow breathing down, continues to yell at this RN.

## 2018-09-15 NOTE — ED Notes (Addendum)
Pt yelling at this RN with Kennith Centerracey EDT in room stating "you need to give me pain medicine stat" pt acting out and not cooperating. Spoke to Dr. Mayford KnifeWilliams and Dr. Pershing ProudSchaevitz about pt presentation. No orders for medicine at this time. Placed in subwait for comfort.

## 2018-09-16 ENCOUNTER — Telehealth: Payer: Self-pay

## 2018-09-16 ENCOUNTER — Other Ambulatory Visit: Payer: Self-pay | Admitting: Family Medicine

## 2018-09-16 NOTE — Telephone Encounter (Signed)
Contacted Pt at [phone]. Explained to Pt the reason for the automatic call as a "soft reminder" to all patients regarding appointment times and with it being Christmas Day yesterday, I certainly understand how this could have possibly disrupted her day with family. Patient appreciated the call back.

## 2018-09-16 NOTE — Telephone Encounter (Signed)
Last OV 07/28/2018   Last refilled 08/23/2018 disp 120 with no refills   Sent to PCP to advise

## 2018-09-16 NOTE — Telephone Encounter (Signed)
Copied from CRM #[id number]. Topic: Complaint - Care >> Sep 16, 2018  8:47 AM Herby AbrahamJohnson, Laura Small wrote: pt called in upset because she received the reminder call. Pt says that she received a reminder via the automatic machine yesterday, pt says that it ruined her Christmas.  Route to Research officer, political partyractice Administrator.

## 2018-09-17 NOTE — Telephone Encounter (Signed)
The patient needs to have her liver function rechecked.  I have reviewed the controlled substance database and sent a refill and to be filled on 09/23/2017.  Please call her to get her set up for labs for her liver function recheck.

## 2018-09-20 ENCOUNTER — Ambulatory Visit: Payer: Self-pay | Admitting: Family Medicine

## 2018-09-20 DIAGNOSIS — Z0289 Encounter for other administrative examinations: Secondary | ICD-10-CM

## 2018-09-23 ENCOUNTER — Other Ambulatory Visit: Payer: Self-pay | Admitting: Family Medicine

## 2018-09-23 NOTE — Telephone Encounter (Signed)
Please call the patient.  She needs to have her liver function rechecked.  An order was previously placed.  Please get her scheduled for a lab visit.  Please see if she is taking Tylenol in addition to her pain medication.  She also recently missed a follow-up appointment.  She needs to have a follow-up appointment scheduled to continue to receive refills on her pain medication.  She will need to have a follow-up by the end of this month.

## 2018-09-24 NOTE — Telephone Encounter (Signed)
LMTCB

## 2018-10-21 ENCOUNTER — Telehealth: Payer: Self-pay | Admitting: Family Medicine

## 2018-10-21 NOTE — Telephone Encounter (Signed)
Refilled: 09/23/2018 Last OV: 07/28/2018 Next OV: not scheduled

## 2018-10-21 NOTE — Telephone Encounter (Signed)
Patient does not currently have an appointment scheduled. She was to follow-up on 09/20/18 for her 3 month controlled substance follow-up though she no showed. She needs to be scheduled to be seen in the next week to receive a refill. I could see her at 11:30 next Wednesday or 4:30 on Monday or Tuesday. Please call her to get her scheduled. Once scheduled I will provide a short term refill to cover until she is seen.

## 2018-10-22 NOTE — Telephone Encounter (Signed)
Please see my prior message.  Please get her scheduled for follow-up.  It appears that she had a refill that was not filled earlier in January and was just filled on 10/21/2018.  She needs to be seen in the office to receive further refills.

## 2018-11-05 NOTE — Telephone Encounter (Signed)
Left detailed message patient needs to call office and schedule follow up appointment.

## 2018-11-09 NOTE — Telephone Encounter (Signed)
Pt returned call. Pt did not want to schedule appt stating that she had seen Dr. Birdie Sons in Dec and Jan. I advised pt of last OV 07/28/2018 and 06/16/2018 and that OV 12/30 was cancelled and not rescheduled. Pt stated she had the flu then and that someone told her she did not need to reschedule. Pt is scheduled for 11/19/2018 3:15pm (by Henrene Pastor). Pt stated she will not have any more blood tests and BS. She is tired of all the BS that she has gone thru. She said that she needs to get her back fixed and that is the priority. Pt noted that [facility] has refused to see her anymore because she declined steroid injections. She states the injections gave her diabetes. Pt did apologize for what she called "unloading" on me. She said she has been suffering for 27 months and that her parents were both on prescription pain medications and died before their time and she isn't going to do it. She wants her back fixed so that she doesn't die before her time.

## 2018-11-09 NOTE — Telephone Encounter (Signed)
Patient has been scheduled for medication follow up with PCP. See note below FYI

## 2018-11-17 ENCOUNTER — Encounter: Payer: Self-pay | Admitting: Family Medicine

## 2018-11-19 ENCOUNTER — Encounter: Payer: Self-pay | Admitting: Family Medicine

## 2018-11-19 ENCOUNTER — Ambulatory Visit (INDEPENDENT_AMBULATORY_CARE_PROVIDER_SITE_OTHER): Payer: Self-pay | Admitting: Family Medicine

## 2018-11-19 VITALS — BP 120/80 | HR 84 | Temp 98.2°F

## 2018-11-19 DIAGNOSIS — E119 Type 2 diabetes mellitus without complications: Secondary | ICD-10-CM

## 2018-11-19 DIAGNOSIS — M5136 Other intervertebral disc degeneration, lumbar region: Secondary | ICD-10-CM

## 2018-11-19 DIAGNOSIS — R945 Abnormal results of liver function studies: Secondary | ICD-10-CM

## 2018-11-19 DIAGNOSIS — R7989 Other specified abnormal findings of blood chemistry: Secondary | ICD-10-CM

## 2018-11-19 DIAGNOSIS — R29898 Other symptoms and signs involving the musculoskeletal system: Secondary | ICD-10-CM

## 2018-11-19 DIAGNOSIS — Z794 Long term (current) use of insulin: Secondary | ICD-10-CM

## 2018-11-19 DIAGNOSIS — M545 Low back pain: Secondary | ICD-10-CM

## 2018-11-19 DIAGNOSIS — G8929 Other chronic pain: Secondary | ICD-10-CM

## 2018-11-19 DIAGNOSIS — F321 Major depressive disorder, single episode, moderate: Secondary | ICD-10-CM

## 2018-11-19 MED ORDER — OXYCODONE-ACETAMINOPHEN 5-325 MG PO TABS: ORAL_TABLET | ORAL | 0 refills | Status: DC

## 2018-11-19 NOTE — Patient Instructions (Signed)
Nice to see you. We will get you to see the neuroorthopedic surgeon for your back. If your leg weakness continues or worsens or does not resolve or you continue to have issues urinating on yourself please go to the emergency room.

## 2018-11-19 NOTE — Assessment & Plan Note (Signed)
Continues to have issues with this.  We will refer to a new surgeon.  A short-term refill will be given to cover until her urine drug screen returns.  Urine drug screen completed today.

## 2018-11-19 NOTE — Progress Notes (Signed)
Tommi Rumps, MD Phone: [phone]  Laura Small is a 58 y.o. female who presents today for follow-up.  CC: Chronic low back pain, diabetes, depression, elevated liver function tests  Chronic low back pain: She continues to have issues with this.  She notes it has worsened to the point where she is not able to lean back as it feels as though pressure builds up in her low back.  She notes numbness and weakness in her left leg that is chronic over the last month and occurs intermittently.  If she does lays down for about 4 or 5 hours it resolves.  She notes she urinated on herself without realizing it about a week ago.  She notes no bowel incontinence.  She continues to take Percocet 4 times a day though does admit that at times she will take it 5 times a day.  She notes no drowsiness with this or alcohol use with this.  She has met with a surgeon and they did discuss an ESI though she wanted to discuss this further and has not been able to get them to respond to her.  Diabetes: Typically running 97-109.  Taking insulin glargine 40 units daily.  No polyuria or polydipsia.  No hypoglycemia.  Depression: Patient denies depression.  No SI.  Elevated LFTs: She notes no abdominal pain.  She is not taking any additional Tylenol.  No alcohol use.  Social History   Tobacco Use  Smoking Status Current Every Day Smoker  . Packs/day: 0.00  . Types: Cigarettes  Smokeless Tobacco Never Used     ROS see history of present illness  Objective  Physical Exam Vitals:   11/19/18 1533  BP: 120/80  Pulse: 84  Temp: 98.2 F (36.8 C)  SpO2: 98%    BP Readings from Last 3 Encounters:  11/19/18 120/80  09/15/18 (!) 127/93  08/21/18 (!) 146/76   Wt Readings from Last 3 Encounters:  09/15/18 232 lb (105.2 kg)  06/17/18 220 lb (99.8 kg)  06/11/18 214 lb 6.4 oz (97.3 kg)    Physical Exam Constitutional:      General: She is not in acute distress.    Appearance: She is not  diaphoretic.  Cardiovascular:     Rate and Rhythm: Normal rate and regular rhythm.     Heart sounds: Normal heart sounds.  Pulmonary:     Effort: Pulmonary effort is normal.     Breath sounds: Normal breath sounds.  Musculoskeletal:     Comments: Patient has lumbar back tenderness in the midline and in the musculature even to the slightest touch, there is no overlying erythema or palpable defects  Skin:    General: Skin is warm and dry.  Neurological:     Mental Status: She is alert.     Comments: 4+/5 strength right quad, hamstring, plantar flexion, and dorsiflexion, sensation to light touch intact right lower extremity, decreased pain sensation right lower extremity patient with inability to move left lower extremity and has decreased light touch sensation and decreased pain sensation      Assessment/Plan: Please see individual problem list.  DM (diabetes mellitus), type 2 (HCC) Check A1c.  Continue current regimen.  Elevated LFTs Check LFTs.  We will need to consider changing her Percocet to plain oxycodone if liver function tests remain elevated.  Lower extremity weakness She has had more persistent issues with this in her left lower extremity.  Given her symptoms overall I did discuss emergent evaluation in the ED for  lumbar spine MRI though she declined this.  I discussed the risk of missing a lesion or cause that could permanently damage her spine and she continued to decline ED evaluation.  I discussed obtaining an MRI to evaluate further and she declined that as well.  She verbalized that she understood the risk of not being evaluated currently.  We will refer at her request to [facility] for her to see an orthopedic neurosurgeon for her back.  I did discuss with the patient that if she were to continue to have symptoms or develop new symptoms with weakness or numbness or incontinence she would need to go to the emergency room for evaluation.  I will send a  message to our referral coordinator to send the referral for the surgeon.  Chronic low back pain Continues to have issues with this.  We will refer to a new surgeon.  A short-term refill will be given to cover until her urine drug screen returns.  Urine drug screen completed today.  Depression Asymptomatic.  Monitor for recurrence.   Patient voiced displeasure with receiving multiple reminder calls and messages regarding her appointments.  She also voiced displeasure regarding a phone conversation she had with someone regarding a appointment that was canceled previously and setting up another appointment.  I advised that she contact our office manager to discuss.  Orders Placed This Encounter  Procedures  . HgB A1c  . Comp Met (CMET)  . Urine drugs of abuse scrn w alc, routine ([facility])  . Oxycodone/Oxymorphone, Urine  . Ambulatory referral to Neurosurgery    Referral Priority:   Routine    Referral Type:   Surgical    Referral Reason:   Specialty Services Required    Requested Specialty:   Neurosurgery    Number of Visits Requested:   1    Meds ordered this encounter  Medications  . oxyCODONE-acetaminophen (PERCOCET/ROXICET) 5-325 MG tablet    Sig: TAKE 1 TABLET BY MOUTH EVERY 6 HOURS AS NEEDED FOR SEVERE PAIN (MAY FILL 11/22/18)    Dispense:  40 tablet    Refill:  0     Tommi Rumps, MD [place]

## 2018-11-19 NOTE — Assessment & Plan Note (Signed)
Asymptomatic.  Monitor for recurrence. 

## 2018-11-19 NOTE — Assessment & Plan Note (Addendum)
She has had more persistent issues with this in her left lower extremity.  Given her symptoms overall I did discuss emergent evaluation in the ED for lumbar spine MRI though she declined this.  I discussed the risk of missing a lesion or cause that could permanently damage her spine and she continued to decline ED evaluation.  I discussed obtaining an MRI to evaluate further and she declined that as well.  She verbalized that she understood the risk of not being evaluated currently.  We will refer at her request to [facility] for her to see an orthopedic neurosurgeon for her back.  I did discuss with the patient that if she were to continue to have symptoms or develop new symptoms with weakness or numbness or incontinence she would need to go to the emergency room for evaluation.  I will send a message to our referral coordinator to send the referral for the surgeon.

## 2018-11-19 NOTE — Assessment & Plan Note (Signed)
Check LFTs.  We will need to consider changing her Percocet to plain oxycodone if liver function tests remain elevated.

## 2018-11-19 NOTE — Telephone Encounter (Signed)
Viewed by PCP and patient has an appointment today.

## 2018-11-19 NOTE — Assessment & Plan Note (Signed)
Check A1c.  Continue current regimen. 

## 2018-11-20 LAB — COMPREHENSIVE METABOLIC PANEL
AG Ratio: 1.4 (calc) (ref 1.0–2.5)
ALKALINE PHOSPHATASE (APISO): 115 U/L (ref 37–153)
ALT: 14 U/L (ref 6–29)
AST: 13 U/L (ref 10–35)
Albumin: 4.2 g/dL (ref 3.6–5.1)
BUN: 11 mg/dL (ref 7–25)
CHLORIDE: 103 mmol/L (ref 98–110)
CO2: 21 mmol/L (ref 20–32)
Calcium: 9.7 mg/dL (ref 8.6–10.4)
Creat: 0.61 mg/dL (ref 0.50–1.05)
Globulin: 3 g/dL (calc) (ref 1.9–3.7)
Glucose, Bld: 315 mg/dL — ABNORMAL HIGH (ref 65–99)
Potassium: 3.8 mmol/L (ref 3.5–5.3)
Sodium: 137 mmol/L (ref 135–146)
Total Bilirubin: 0.4 mg/dL (ref 0.2–1.2)
Total Protein: 7.2 g/dL (ref 6.1–8.1)

## 2018-11-20 LAB — OXYCODONE/OXYMORPHONE, URINE: Oxycodone+Oxymorphone Ur Ql Scn: POSITIVE ng/mL — AB

## 2018-11-20 LAB — URINE DRUGS OF ABUSE SCREEN W ALC, ROUTINE (REF LAB)
Amphetamines, Urine: NEGATIVE ng/mL
Barbiturate Quant, Ur: NEGATIVE ng/mL
Benzodiazepine Quant, Ur: NEGATIVE ng/mL
Cannabinoid Quant, Ur: NEGATIVE ng/mL
Cocaine (Metab.): NEGATIVE ng/mL
Ethanol, Urine: NEGATIVE %
Methadone Screen, Urine: NEGATIVE ng/mL
Opiate Quant, Ur: NEGATIVE ng/mL
PCP Quant, Ur: NEGATIVE ng/mL
Propoxyphene: NEGATIVE ng/mL

## 2018-11-20 LAB — HEMOGLOBIN A1C
Hgb A1c MFr Bld: 9.5 % of total Hgb — ABNORMAL HIGH (ref <5.7)
Mean Plasma Glucose: 226 (calc)
eAG (mmol/L): 12.5 (calc)

## 2018-11-29 ENCOUNTER — Encounter: Payer: Self-pay | Admitting: Family Medicine

## 2018-11-29 ENCOUNTER — Other Ambulatory Visit: Payer: Self-pay | Admitting: Family Medicine

## 2018-11-29 MED ORDER — OXYCODONE-ACETAMINOPHEN 5-325 MG PO TABS: ORAL_TABLET | ORAL | 0 refills | Status: DC

## 2018-11-29 NOTE — Telephone Encounter (Signed)
Last OV 11/19/2018  Last refilled 11/22/2018 disp 40 with no refills   Call pharmacy did pt pick up Rx ?

## 2018-11-29 NOTE — Telephone Encounter (Signed)
Sent to PCP   Last OV 11/19/2018  Last refill 11/22/2018 disp 40 with no refills   Next appt 03/07/2019

## 2018-12-19 ENCOUNTER — Encounter: Payer: Self-pay | Admitting: Family Medicine

## 2018-12-20 ENCOUNTER — Other Ambulatory Visit: Payer: Self-pay | Admitting: Family Medicine

## 2018-12-20 MED ORDER — OXYCODONE-ACETAMINOPHEN 5-325 MG PO TABS: ORAL_TABLET | ORAL | 0 refills | Status: DC

## 2018-12-20 NOTE — Telephone Encounter (Signed)
Sent to PCP to advise  Last OV 11/19/2018  Last refilled 12/02/2018 disp 80 with no refills   Next appt 03/07/2019

## 2018-12-21 NOTE — Telephone Encounter (Signed)
Last OV 11/19/2018   Last refilled 12/22/2018 disp 120 with no refills   Next appt 03/07/2019

## 2019-01-18 ENCOUNTER — Other Ambulatory Visit: Payer: Self-pay | Admitting: Family Medicine

## 2019-01-18 NOTE — Telephone Encounter (Signed)
PMP registry reviewed and is correct for refill

## 2019-01-18 NOTE — Telephone Encounter (Signed)
Last OV 11/19/18 and last refill 12/22/18?

## 2019-02-17 ENCOUNTER — Other Ambulatory Visit: Payer: Self-pay | Admitting: Family Medicine

## 2019-02-18 ENCOUNTER — Other Ambulatory Visit: Payer: Self-pay | Admitting: Lab

## 2019-02-22 ENCOUNTER — Telehealth: Payer: Self-pay | Admitting: Family Medicine

## 2019-02-22 MED ORDER — OXYCODONE-ACETAMINOPHEN 5-325 MG PO TABS: ORAL_TABLET | ORAL | 0 refills | Status: DC

## 2019-02-22 NOTE — Telephone Encounter (Signed)
Last Fill 01/20/19 for 120 tablets patient requesting refill, last OV 11/19/18

## 2019-02-22 NOTE — Telephone Encounter (Signed)
I have sent a refill to the patients pharmacy. Please see what the patient means by "she cannot walk" as documented in Nina's note. Please see if she was ever contacted by [facility] neurosurgery for an appointment.

## 2019-02-22 NOTE — Telephone Encounter (Signed)
Copied from CRM [id number]. Topic: Quick Communication - Rx Refill/Question >> Feb 22, 2019  8:13 AM Tamela Oddi wrote: Medication: oxyCODONE-acetaminophen (PERCOCET/ROXICET) 5-325 MG tablet  Patient called to request a refill for the above medication  Preferred Pharmacy (with phone number or street name): [facility] [place],  - [address] [phone] (Phone) [phone] (Fax)

## 2019-02-22 NOTE — Telephone Encounter (Signed)
Tried to reach patient by phone no answer and no voicemail. 

## 2019-02-23 ENCOUNTER — Telehealth: Payer: Self-pay | Admitting: *Deleted

## 2019-02-23 NOTE — Telephone Encounter (Signed)
Copied from CRM [id number]. Topic: General - Inquiry >> Feb 22, 2019  1:58 PM Reggie Pile, NT wrote: Reason for CRM: Patient is calling in to tell Coralee North, thank you for fixing the issue. Patient states she means it from the bottom of her heart.

## 2019-03-07 ENCOUNTER — Encounter: Payer: Self-pay | Admitting: Family Medicine

## 2019-03-07 ENCOUNTER — Telehealth: Payer: Self-pay | Admitting: Family Medicine

## 2019-03-07 ENCOUNTER — Ambulatory Visit (INDEPENDENT_AMBULATORY_CARE_PROVIDER_SITE_OTHER): Payer: Self-pay | Admitting: Family Medicine

## 2019-03-07 ENCOUNTER — Other Ambulatory Visit: Payer: Self-pay

## 2019-03-07 DIAGNOSIS — Z794 Long term (current) use of insulin: Secondary | ICD-10-CM

## 2019-03-07 DIAGNOSIS — M545 Low back pain, unspecified: Secondary | ICD-10-CM

## 2019-03-07 DIAGNOSIS — G8929 Other chronic pain: Secondary | ICD-10-CM

## 2019-03-07 DIAGNOSIS — E119 Type 2 diabetes mellitus without complications: Secondary | ICD-10-CM

## 2019-03-07 DIAGNOSIS — M5136 Other intervertebral disc degeneration, lumbar region: Secondary | ICD-10-CM

## 2019-03-07 DIAGNOSIS — E782 Mixed hyperlipidemia: Secondary | ICD-10-CM

## 2019-03-07 NOTE — Progress Notes (Signed)
Virtual Visit via telephone Note  This visit type was conducted due to national recommendations for restrictions regarding the COVID-19 pandemic (e.g. social distancing).  This format is felt to be most appropriate for this patient at this time.  All issues noted in this document were discussed and addressed.  No physical exam was performed (except for noted visual exam findings with Video Visits).   I connected with Laura Small today at  3:30 PM EDT by telephone and verified that I am speaking with the correct person using two identifiers. Location patient: home Location provider: work Persons participating in the virtual visit: patient, provider, Delana MeyerDenny Finelli (brother)  I discussed the limitations, risks, security and privacy concerns of performing an evaluation and management service by telephone and the availability of in person appointments. I also discussed with the patient that there may be a patient responsible charge related to this service. The patient expressed understanding and agreed to proceed.  Interactive audio and video telecommunications were attempted between this provider and patient, however failed, due to patient having technical difficulties OR patient did not have access to video capability.  We continued and completed visit with audio only.  Reason for visit: follow-up  HPI: DIABETES Disease Monitoring: Blood Sugar ranges-130-250 fasting, 90-300 two hours postprandial polyuria/phagia/dipsia-no      Optho-cannot afford to do this currently, does note her vision has improved some Medications: Compliance-patient is taking insulin glargine 30-50 units daily.  She varies her dosing based on what her sugars are.  If her glucose is elevated she will take 50 units.  If it is not elevated she will take 30 units.  Occasionally she takes in between 30 and 50 units. Hypoglycemic symptoms-1 time she felt jittery and nervous when her blood glucose was 88, she ate something  and improved  Chronic low back pain: Patient notes her back pain has worsened.  Pain has intensified.  She does have intermittent numbness that comes and goes in her bilateral legs.  She does feel as though her legs are weak to a certain degree.  She notes she cannot stand for long or climb the stairs as easily.  She feels this is related to the pain and weakness.  She notes 2 occasions of urinary incontinence though this occurred with urgency and she was not able to get to the bathroom quickly enough.  No bowel incontinence.  She takes oxycodone typically 4 times per day though she does report she takes it occasionally 5 or 6 times a day.  No alcohol intake.  No drowsiness.  She notes she was never contacted regarding the [facility].  Hyperlipidemia: She denies chest pain.    ROS: See pertinent positives and negatives per HPI.  Past Medical History:  Diagnosis Date  . Back pain   . Chicken pox   . Diabetes mellitus without complication (HCC)   . DJD (degenerative joint disease)   . Migraines   . PONV (postoperative nausea and vomiting)    usually has 1 episode of vomiting within 1 hour of anesthesia     Past Surgical History:  Procedure Laterality Date  . BACK SURGERY    . CHOLECYSTECTOMY    . INSERTION OF MESH N/A 02/12/2015   Procedure: INSERTION OF MESH;  Surgeon: Natale LayMark Bird, MD;  Location: [facility] ORS;  Service: General;  Laterality: N/A;  . VENTRAL HERNIA REPAIR N/A 02/12/2015   Procedure: HERNIA REPAIR VENTRAL ADULT;  Surgeon: Natale LayMark Bird, MD;  [facility]  ORS;  Service: General;  Laterality: N/A;    Family History  Problem Relation Age of Onset  . Diabetes Mother   . Gallbladder disease Mother   . Heart disease Father   . Alcohol abuse Father   . Gallbladder disease Father   . Heart disease Paternal Grandmother   . Colon cancer Neg Hx     SOCIAL HX: smoker   Current Outpatient Medications:  .  Insulin Glargine (BASAGLAR KWIKPEN) 100  UNIT/ML SOPN, Inject 40 Units into the skin daily. , Disp: , Rfl:  .  Insulin Pen Needle 32G X 6 MM MISC, Use as directed for once daily insulin. E11.9, Disp: 100 each, Rfl: 3 .  oxyCODONE-acetaminophen (PERCOCET/ROXICET) 5-325 MG tablet, TAKE 1 TABLET BY MOUTH EVERY 6 HOURS AS NEEDED FOR PAIN, Disp: 120 tablet, Rfl: 0  EXAM: This was a telehealth telephone visit and thus no physical exam was completed.  ASSESSMENT AND PLAN:  Discussed the following assessment and plan:  Chronic low back pain Patient reports subjective worsening of symptoms.  I discussed that she needs to see a specialist for her back to determine if there is anything surgically or procedurally that can be done to help.  We will have our referral coordinator send a referral back to [facility] neurosurgery.  I additionally discussed that once the patient sees them that if there is not anything for them to do to help with her discomfort that we would need to have her see a pain specialist to help with management of her chronic pain medication and chronic pain.  I advised her that she should not take more pain medication than is prescribed.  Controlled substance database has been reviewed.  DM (diabetes mellitus), type 2 ([place]) Patient's diabetes is uncontrolled.  I discussed that her insulin glargine should be taken at a consistent dose daily and that she is likely chasing sugars with altering her dosage from day-to-day.  She voiced concern regarding this as she notes that altering her dosage is what seems to work for her.  I advised her that I would like for her to take a consistent dose daily and I would talk to our clinical pharmacist regarding an oral medication or other non-insulin medication to add to her regimen given that she does not have insurance.  We will contact her after I speak with our clinical pharmacist.  Hyperlipidemia Plan to recheck cholesterol with her next visit.  [place] office staff will contact the patient  to schedule follow-up for 3 months.  Social distancing precautions and sick precautions given regarding COVID-19.   I discussed the assessment and treatment plan with the patient. The patient was provided an opportunity to ask questions and all were answered. The patient agreed with the plan and demonstrated an understanding of the instructions.   The patient was advised to call back or seek an in-person evaluation if the symptoms worsen or if the condition fails to improve as anticipated.  I provided 26 minutes of non-face-to-face time during this encounter.   Tommi Rumps, MD

## 2019-03-07 NOTE — Assessment & Plan Note (Signed)
Patient's diabetes is uncontrolled.  I discussed that her insulin glargine should be taken at a consistent dose daily and that she is likely chasing sugars with altering her dosage from day-to-day.  She voiced concern regarding this as she notes that altering her dosage is what seems to work for her.  I advised her that I would like for her to take a consistent dose daily and I would talk to our clinical pharmacist regarding an oral medication or other non-insulin medication to add to her regimen given that she does not have insurance.  We will contact her after I speak with our clinical pharmacist.

## 2019-03-07 NOTE — Assessment & Plan Note (Signed)
Patient reports subjective worsening of symptoms.  I discussed that she needs to see a specialist for her back to determine if there is anything surgically or procedurally that can be done to help.  We will have our referral coordinator send a referral back to [facility] neurosurgery.  I additionally discussed that once the patient sees them that if there is not anything for them to do to help with her discomfort that we would need to have her see a pain specialist to help with management of her chronic pain medication and chronic pain.  I advised her that she should not take more pain medication than is prescribed.  Controlled substance database has been reviewed.

## 2019-03-07 NOTE — Assessment & Plan Note (Signed)
Plan to recheck cholesterol with her next visit.

## 2019-03-07 NOTE — Telephone Encounter (Signed)
Please let the patient know I spoke with our clinical pharmacist.  Our recommendations would be for the patient to take basaglar 40 units daily.  She would take this dosage consistently and not move the dosage up and down as she has been.  I would also like to add an injectable non-insulin diabetes medication.  Would she be willing to do this?  If she would be willing to do this we can have the medication potentially come through medication management as the patient lives in [facility] and potentially could receive this medication for little to no cost.  If she is willing to do that we can send a message to medication management and some someone would contact her to let her know what they would need from her to get this approved.

## 2019-03-08 NOTE — Telephone Encounter (Signed)
Noted.  Prior message reviewed.  We could consider adding an oral medicine such as Jardiance.  We need to try to alter her regimen in some manner as her diabetes is uncontrolled and we need to get this under better control so that if she is able to have a procedure done for her back the surgeon will be able to do this.  Uncontrolled diabetes could prevent her from being able to have a procedure for her back in the future.  I would advise that she use the Basaglar 40 units daily and that we start her on Jardiance for her diabetes.

## 2019-03-08 NOTE — Telephone Encounter (Signed)
Called and spoke to pt and gave recommendations from PCP's phone note.  Patient said that she did not want to be stuck with another needle and declined recommendations.    Please note:  Called to speak to patient at number left in appointment notes.  Patient's husband answered the phone and said that pt was sleep because she had taken pain medication.  Patient's husband wanted to know if call was about patient's pain medication or diabetes.  Informed patient's husband after reviewing DPR in patient's chart that patient had not signed a release to speak to him and that I legally could not speak to him.  Husband got upset and said that it should have been changed.  Informed husband that patient would need to come into office to sign consent.  Husband said no and that patient was in too much pain to travel and come into office.  Husband complained that nothing had been done about patient's pain in over 3 years.  Later husband said patient was up and could talk.  Gave patient information from PCP's note.  Patient went on to complain that the office nearly cost her marriage since the staff made it look as if she was lying by not speaking to her husband.  Spoke to clinical team lead who checked to make sure that no new released had been signed already that may have been in chart and to confirm that patient will need to come into office to sign DPR.  Informed patient that she will need to come into office to sign DPR when she is feeling better in order to release information to her husband and that the form could not be mailed to office.  Informed patient that if she chose to put husband on speaker phone while office staff spoke to her that she could if she chose to but we could not speak with him directly.  Patient said that she would come into office when she was feeling better to sign form.  Asked patient to update her chart with phone number from her virtual appointment since no numbers are listed in her chart.   Patient declined and said not to list any phone numbers and to leave blank since she has MyChart.

## 2019-03-11 NOTE — Telephone Encounter (Signed)
Called patient at number listed in appt notes from last [facility] appointment [phone].  Pt's husband answered the phone and said that patient was asleep.  Best time to call back is between 1-3 pm.

## 2019-03-16 ENCOUNTER — Other Ambulatory Visit: Payer: Self-pay

## 2019-03-16 ENCOUNTER — Emergency Department
Admission: EM | Admit: 2019-03-16 | Discharge: 2019-03-16 | Disposition: A | Discharge status: Home / Self Care | Payer: Self-pay | Attending: Emergency Medicine | Admitting: Emergency Medicine

## 2019-03-16 DIAGNOSIS — Z5321 Procedure and treatment not carried out due to patient leaving prior to being seen by health care provider: Secondary | ICD-10-CM | POA: Insufficient documentation

## 2019-03-16 DIAGNOSIS — M549 Dorsalgia, unspecified: Secondary | ICD-10-CM | POA: Insufficient documentation

## 2019-03-16 NOTE — ED Notes (Signed)
Pt has not returned to ED lobby 

## 2019-03-16 NOTE — ED Notes (Signed)
Pt has not returned to ED 

## 2019-03-16 NOTE — ED Notes (Signed)
Pt noted leaving ED lobby by self in w/c

## 2019-03-16 NOTE — ED Triage Notes (Signed)
Pt in with co lower back pain that radiates into buttocks and left leg. Pt has hx of chronic back pain, states ran out of oxycodone.

## 2019-03-16 NOTE — Telephone Encounter (Signed)
Called number that was listed in patient's chart for [facility] Appt. [phone].  Patient's husband answered and was upset.  Husband said not to call the number again it was only to be used for Emergency and patient should be contacted through [facility] only.

## 2019-03-16 NOTE — ED Notes (Signed)
Pt has not returned to lobby 

## 2019-03-16 NOTE — Telephone Encounter (Signed)
Pt called and wanted to speak with Dr. Caryl Bis about him wanted to put her on Jardiance when in the past it caused a 4 inch rash from arm pit to armpit and was told to stop taking it.  Pt is upset with how her care is going, feels no one cares and doesn't want to be seen for any procedures such as mammograms, colonoscopies, endoscopies, etc... Pt does not want to discuss diabetes.Pt states since she has been taking her Basaglar she has been fine and doesn't want any more needles. Pt just wants to discuss her back and how it can get better and how she can have relief to help her with walking.   Pt states she has been in so much pain and it hurts when she goes to the bathroom and causes her to scream. The pain is so bad and then when it subsides for a moment her legs sometimes begin to experience numbness. / please advise

## 2019-03-16 NOTE — Telephone Encounter (Signed)
Sent your advisement from previous note to patient in a MyChart message.

## 2019-03-18 NOTE — Telephone Encounter (Signed)
Pt called and wanted to speak with Dr. Caryl Bis about him wanted to put her on Jardiance when in the past it caused a 4 inch rash from arm pit to armpit and was told to stop taking it.  Pt is upset with how her care is going, feels no one cares and doesn't want to be seen for any procedures such as mammograms, colonoscopies, endoscopies, etc... Pt does not want to discuss diabetes.Pt states since she has been taking her Basaglar she has been fine and doesn't want any more needles. Pt just wants to discuss her back and how it can get better and how she can have relief to help her with walking.   Pt states she has been in so much pain and it hurts when she goes to the bathroom and causes her to scream. The pain is so bad and then when it subsides for a moment her legs sometimes begin to experience numbness. / please advise        Documentation

## 2019-03-18 NOTE — Telephone Encounter (Signed)
I understand the patients frustration with her back. I have referred her to multiple surgeons to get their input on this. I have recently referred her again to neurosurgery at [facility]. Has she heard anything about this referral or been scheduled for an appointment? She really needs to see a surgeon to get their opinion to see if a surgery or other procedure would be beneficial. They will have to see her to make that determination and it will be up to the surgeon to determine what the best treatment is after they see her.  I apologize for not noticing that Laura Small is listed as an allergy. Given her prior rash I certainly would not want to start her on this though we do need to do something for her diabetes as her most recent A1c was uncontrolled at 9.5 and basaglar is not controlling her diabetes adequately at this time given her uncontrolled diabetes.  If her diabetes does not come under better control any surgeon she see's is likely not going to do any procedure on her as healing would be difficult with uncontrolled diabetes. We could add a different oral pill to help with her diabetes if she is willing.  She also needs to follow the directions given regarding her basaglar and take basaglar 40 units daily consistently and not vary her basaglar doses as she has been.

## 2019-03-19 ENCOUNTER — Encounter: Payer: Self-pay | Admitting: Emergency Medicine

## 2019-03-19 ENCOUNTER — Other Ambulatory Visit: Payer: Self-pay

## 2019-03-19 ENCOUNTER — Emergency Department
Admission: EM | Admit: 2019-03-19 | Discharge: 2019-03-19 | Disposition: A | Discharge status: Home / Self Care | Payer: Self-pay | Attending: Emergency Medicine | Admitting: Emergency Medicine

## 2019-03-19 DIAGNOSIS — E119 Type 2 diabetes mellitus without complications: Secondary | ICD-10-CM | POA: Insufficient documentation

## 2019-03-19 DIAGNOSIS — M5440 Lumbago with sciatica, unspecified side: Secondary | ICD-10-CM | POA: Insufficient documentation

## 2019-03-19 DIAGNOSIS — Z794 Long term (current) use of insulin: Secondary | ICD-10-CM | POA: Insufficient documentation

## 2019-03-19 DIAGNOSIS — F17210 Nicotine dependence, cigarettes, uncomplicated: Secondary | ICD-10-CM | POA: Insufficient documentation

## 2019-03-19 DIAGNOSIS — G8929 Other chronic pain: Secondary | ICD-10-CM | POA: Insufficient documentation

## 2019-03-19 MED ORDER — HYDROMORPHONE HCL 1 MG/ML IJ SOLN
1.0000 mg | Freq: Once | INTRAMUSCULAR | Status: AC
  Administered 2019-03-19: 1 mg via INTRAMUSCULAR
  Filled 2019-03-19: qty 1

## 2019-03-19 NOTE — ED Triage Notes (Addendum)
Pt arrived via POV with reports of low back pain, pt states PCP wants to do other things besides take care of her back pain.  Pt has hx of chronic back pain, pt takes oxycodone at home and has ran out of it June 15th. Pt states she called the office but states her PCP started yelling at her, states he does not like to prescribe it.

## 2019-03-19 NOTE — ED Provider Notes (Signed)
[facility] Emergency Department Provider Note   ____________________________________________   First MD Initiated Contact with Patient 03/19/19 1701     (approximate)  I have reviewed the triage vital signs and the nursing notes.   HISTORY  Chief Complaint Back Pain    HPI Laura Small is a 58 y.o. female patient arrives complaining of her chronic low back pain.  Patient stated bilateral radicular component to her pain.  Patient denies bladder bowel dysfunction.  Patient state no longer prescribe pain medication.  Patient last prescribed 120 oxycodone on 02/22/2019.  Patient that she is ran out of pain medications.  Patient states she has been promised multiple consults to either neurology or pain management but none have materialized.  Patient rates her pain as a 10+.  Patient scribed pain is "achy/sharp".      Past Medical History:  Diagnosis Date  . Back pain   . Chicken pox   . Diabetes mellitus without complication (HCC)   . DJD (degenerative joint disease)   . Migraines   . PONV (postoperative nausea and vomiting)    usually has 1 episode of vomiting within 1 hour of anesthesia     Patient Active Problem List   Diagnosis Date Noted  . Elevated LFTs 06/16/2018  . Lower extremity weakness 06/10/2018  . Elevated BP without diagnosis of hypertension 04/14/2018  . Depression 10/12/2017  . Muscle spasms of both lower extremities 10/12/2017  . Aortic atherosclerosis (HCC) 01/08/2017  . Tobacco abuse 01/08/2017  . Ileitis 01/08/2017  . DM (diabetes mellitus), type 2 (HCC) 09/23/2016  . Hyperlipidemia 09/23/2016  . Chronic low back pain 09/08/2016    Past Surgical History:  Procedure Laterality Date  . BACK SURGERY    . CHOLECYSTECTOMY    . INSERTION OF MESH N/A 02/12/2015   Procedure: INSERTION OF MESH;  Surgeon: Natale LayMark Bird, MD;  Location: [facility] ORS;  Service: General;  Laterality: N/A;  . VENTRAL HERNIA REPAIR N/A 02/12/2015   Procedure: HERNIA REPAIR VENTRAL ADULT;  Surgeon: Natale LayMark Bird, MD;  Location: [facility] ORS;  Service: General;  Laterality: N/A;    Prior to Admission medications   Medication Sig Start Date End Date Taking? Authorizing Provider  Insulin Glargine (BASAGLAR KWIKPEN) 100 UNIT/ML SOPN Inject 40 Units into the skin daily.  10/15/17   [provider]  Insulin Pen Needle 32G X 6 MM MISC Use as directed for once daily insulin. E11.9 06/11/18   Alford HighlandWieting, Richard, MD  oxyCODONE-acetaminophen (PERCOCET/ROXICET) 5-325 MG tablet TAKE 1 TABLET BY MOUTH EVERY 6 HOURS AS NEEDED FOR PAIN 02/22/19   Glori LuisSonnenberg, Eric G, MD    Allergies Bee venom, Advil [ibuprofen], Jardiance [empagliflozin], Nitroglycerin, and Prednisone  Family History  Problem Relation Age of Onset  . Diabetes Mother   . Gallbladder disease Mother   . Heart disease Father   . Alcohol abuse Father   . Gallbladder disease Father   . Heart disease Paternal Grandmother   . Colon cancer Neg Hx     Social History Social History   Tobacco Use  . Smoking status: Current Every Day Smoker    Packs/day: 0.00    Types: Cigarettes  . Smokeless tobacco: Never Used  Substance Use Topics  . Alcohol use: No  . Drug use: No    Review of Systems  Constitutional: No fever/chills Eyes: No visual changes. ENT: No sore throat. Cardiovascular: Denies chest pain. Respiratory: Denies shortness of breath. Gastrointestinal: No abdominal pain.  No nausea, no vomiting.  No diarrhea.  No constipation. Genitourinary: Negative for dysuria. Musculoskeletal: Chronic back pain.. Skin: Negative for rash. Neurological: Negative for headaches, focal weakness or numbness. Psychiatric:  Depression Endocrine:  Diabetes and lipidemia. Allergic/Immunilogical: See medication list.  ____________________________________________   PHYSICAL EXAM:  VITAL SIGNS: ED Triage Vitals  Enc Vitals Group     BP 03/19/19 1510 (!) 168/118     Pulse Rate 03/19/19  1510 (!) 119     Resp 03/19/19 1510 16     Temp 03/19/19 1510 99.7 F (37.6 C)     Temp src --      SpO2 03/19/19 1510 97 %     Weight 03/19/19 1509 220 lb (99.8 kg)     Height 03/19/19 1509 5\' 7"  (1.702 m)     Head Circumference --      Peak Flow --      Pain Score 03/19/19 1533 10     Pain Loc --      Pain Edu? --      Excl. in [place]? --     Constitutional: Alert and oriented. Well appearing and in no acute distress. Neck:No cervical spine tenderness to palpation. Hematological/Lymphatic/Immunilogical: No cervical lymphadenopathy. Cardiovascular: Tachycardic, regular rhythm. Grossly normal heart sounds.  Good peripheral circulation. Respiratory: Normal respiratory effort.  No retractions. Lungs CTAB. Musculoskeletal: No spinal deformity.  Patient moderate guarding palpation L2-S1.  No lower extremity tenderness nor edema.  Using distraction patient had bilateral  negative straight leg test. Neurologic:  Normal speech and language. No gross focal neurologic deficits are appreciated. No gait instability. Skin:  Skin is warm, dry and intact. No rash noted. Psychiatric: Mood and affect are normal. Speech and behavior are normal.  ____________________________________________   LABS (all labs ordered are listed, but only abnormal results are displayed)  Labs Reviewed - No data to display ____________________________________________  EKG   ____________________________________________  RADIOLOGY  ED MD interpretation:    Official radiology report(s): No results found.  ____________________________________________   PROCEDURES  Procedure(s) performed (including Critical Care):  Procedures   ____________________________________________   INITIAL IMPRESSION / ASSESSMENT AND PLAN / ED COURSE  As part of my medical decision making, I reviewed the following data within the [place] was evaluated in Emergency  Department on 03/19/2019 for the symptoms described in the history of present illness. She was evaluated in the context of the global COVID-19 pandemic, which necessitated consideration that the patient might be at risk for infection with the SARS-CoV-2 virus that causes COVID-19. Institutional protocols and algorithms that pertain to the evaluation of patients at risk for COVID-19 are in a state of rapid change based on information released by regulatory bodies including the CDC and federal and state organizations. These policies and algorithms were followed during the patient's care in the ED.    Patient presents for pain management secondary to chronic back pain.  Discussed rationale for not continue pain medications on outpatient patient from the ED.  Patient will consult the pain management.  Patient advised to follow-up PCP and that she can get into pain management.   ____________________________________________   FINAL CLINICAL IMPRESSION(S) / ED DIAGNOSES  Final diagnoses:  Chronic midline low back pain with sciatica, sciatica laterality unspecified     ED Discharge Orders    None       Note:  This document was prepared using Dragon voice recognition software and may include unintentional dictation errors.  Joni ReiningSmith, Tilia Faso K, PA-C 03/19/19 1722    Shaune PollackIsaacs, Cameron, MD 03/20/19 [phone]

## 2019-03-21 ENCOUNTER — Telehealth: Payer: Self-pay | Admitting: Family Medicine

## 2019-03-21 ENCOUNTER — Telehealth: Payer: Self-pay | Admitting: *Deleted

## 2019-03-21 NOTE — Telephone Encounter (Signed)
Copied from [place] [id number]. Topic: Complaint - Staff >> Mar 16, 2019  4:05 PM Alanda Slim E wrote: Date of Incident: ? Possible 6.23.20 or 6.24.20 Details of complaint: Pt stated that donna had an attitude when speaking with her and she doesn't want Butch Penny to call her or handle any of her Mychart messages.  How would the patient like to see it resolved? Pt asked to not have to deal with Butch Penny  On a scale of 1-10, how was your experience?  What would it take to bring it to a 10?   Route to Engineer, building services. (routed to [employer] no route shown for [employer])

## 2019-03-22 NOTE — Telephone Encounter (Signed)
Noted. I will forward to [facility] for her to review as well.

## 2019-03-23 ENCOUNTER — Other Ambulatory Visit: Payer: Self-pay | Admitting: Family Medicine

## 2019-03-23 ENCOUNTER — Ambulatory Visit: Payer: Self-pay

## 2019-03-23 NOTE — Telephone Encounter (Signed)
Patient's brother would like a call back from the nurse stating why the refill was refused.  CB# [phone]

## 2019-03-23 NOTE — Telephone Encounter (Signed)
Patient's husband called asking for the status of the Oxycodone refill.  He stated that the patient will be out of medication tomorrow.

## 2019-03-23 NOTE — Telephone Encounter (Signed)
Pt's husband states the patient is in excrutiating pain and she only has 2 oxycodone pills left, the medication was refused and I don't see a note as to why and the patient is asking why was it refused.  Please advise.  Rocko Fesperman,cma

## 2019-03-23 NOTE — Telephone Encounter (Signed)
Message from Sharene Skeans sent at 03/23/2019 5:45 PM EDT  Summary: pharmacy order advise    Pt was told that her oxyCODONE-acetaminophen (PERCOCET/ROXICET) 5-325 MG tablet but the request from the pharmacy was denied because the provider said he had already responded to the refill but the pharmacy is stating they do not have the refill and it was not sent. The medication order was signed by CMA Gae Bon with a start date of 6.29.20, so I do not know if someone meant to send it but the pharmacy needs a verbal and im not sure if I can be given . The Pt is in severe pain and will not have any meds to take tonight or tomorrow morning/ Pt wanted to know if a nurse can contact a Dr. From [place] office to have this corrected right away /please advise

## 2019-03-24 MED ORDER — OXYCODONE-ACETAMINOPHEN 5-325 MG PO TABS: ORAL_TABLET | ORAL | 0 refills | Status: DC

## 2019-03-24 NOTE — Telephone Encounter (Signed)
Pt is calling and wanting to know status of medication request. Pt is in tremendous pain and have no pain medication. Please advise.

## 2019-03-24 NOTE — Telephone Encounter (Signed)
Pt brother calling to check status.   TM#[phone]

## 2019-03-24 NOTE — Telephone Encounter (Signed)
This was sent to [facility] earlier today. Please call them to see if they received the prescription.

## 2019-03-24 NOTE — Telephone Encounter (Signed)
I am not sure why this was refused as I did not refuse it. I will send a refill to her pharmacy. Thanks.

## 2019-03-24 NOTE — Telephone Encounter (Signed)
Called and informed patient that her medication was sent to pharmacy, pt understand.  Nina,cma

## 2019-03-24 NOTE — Addendum Note (Signed)
Addended by: Leone Haven on: 03/24/2019 10:38 AM   Modules accepted: Orders

## 2019-03-24 NOTE — Telephone Encounter (Signed)
Dr. Caryl Bis, this patient is calling wanting to know why you refused her oxycodone I explained to him tat you were referring her to a neurosurgeon and her husband is also calling stating that she is in severe pain and she needs her medication, please advise.  Nina,cma

## 2019-03-24 NOTE — Telephone Encounter (Signed)
Patient has medication

## 2019-03-30 NOTE — Telephone Encounter (Signed)
Reviewed. Thank you Juliann Pulse for follow-up on Donna's behalf.

## 2019-04-20 ENCOUNTER — Other Ambulatory Visit: Payer: Self-pay | Admitting: Family Medicine

## 2019-04-20 NOTE — Telephone Encounter (Signed)
Please call the patient.  It appears that she got hydrocodone and diazepam from an ED physician in [place], [place].  Please see why she was seen in the ED in [place].  She should have had plenty of Percocet for her pain and should not have required any additional narcotics.  Per the terms of her controlled substance contract she is to inform us when she receives controlled substances from any other providers.

## 2019-04-21 NOTE — Telephone Encounter (Signed)
Noted. I will await her mychart response.

## 2019-04-21 NOTE — Telephone Encounter (Signed)
There is no number on file for this patient, I cannot reach out to her, she took her phone number out of our system. I will send her your mychart message.  Sahra Converse,cma

## 2019-04-22 ENCOUNTER — Encounter: Payer: Self-pay | Admitting: Family Medicine

## 2019-04-22 ENCOUNTER — Telehealth: Payer: Self-pay | Admitting: Family Medicine

## 2019-04-22 MED ORDER — OXYCODONE-ACETAMINOPHEN 5-325 MG PO TABS: ORAL_TABLET | ORAL | 0 refills | Status: DC

## 2019-04-22 NOTE — Telephone Encounter (Signed)
Pt called through [facility] per rep about phone number not on chart due to many calls coming in to her. When I took over the call the husband was on the phone. I checked the DPR to make sure I could speak with him. DPR states not to talk with husband about PHI. I advised husband I could not speak with him he states she was right next to him and she gets on the phone and starts being irrate after I said to her I cannot speak with the husband she says you don't want to talk with me and I will cut you a new one. After that I gave the call to my team lead.

## 2019-04-22 NOTE — Telephone Encounter (Signed)
Patient husband called advised I would need to speak with patient that due to HIPPA laws , and patient signed DPR I would need to speak with her, read my chart message to patient and ask per conversation with PCP, why did she go to [facility] to ER , patient stated her pain in her back was so bad , after getting up out of a recliner and hearing 3 bones sound like they were popping, she went to [facility] because she knew no [place] doctor would believe her pain. Advised patient that she is under a controlled substance agreement and that she is bound by that agreement to report receiving controlled medications from another provider.Patient stated she did not know she was suppose to report medications to PCP. Patient started yelling" all the confusion with the phone calls sometimes 30 a day from the office" she was to confused to know what she was supposed to be doing . Advised patient I would report to PCP .

## 2019-04-22 NOTE — Telephone Encounter (Signed)
The patients husband called Valaria Good stating that his wife was out of medication after taking her last dose today. He also informed me that his wife went to [facility] to be seen because she only received an injection from the ED at [facility] .Mr. Cather continued to inform me that he wanted a copy of the controlled substance contract. I stated I will pass this information on to the patients physician.

## 2019-04-25 NOTE — Telephone Encounter (Signed)
See other my chart message.  Medication refilled last week.

## 2019-05-04 ENCOUNTER — Telehealth: Payer: Self-pay | Admitting: Pharmacy Technician

## 2019-05-04 NOTE — Telephone Encounter (Signed)
Patient failed to provide2020 financial documentation. No additional medication assistance will be provided by [facility] without the required proof of income documentation. Patient notified by letter.  Daivik Overley, CPhT [facility] 

## 2019-05-19 ENCOUNTER — Encounter: Payer: Self-pay | Admitting: Family Medicine

## 2019-05-23 ENCOUNTER — Other Ambulatory Visit: Payer: Self-pay | Admitting: Family Medicine

## 2019-06-01 ENCOUNTER — Encounter: Payer: Self-pay | Admitting: Family Medicine

## 2019-06-10 ENCOUNTER — Ambulatory Visit: Payer: Self-pay | Admitting: Family Medicine

## 2019-06-11 ENCOUNTER — Encounter: Payer: Self-pay | Admitting: Family Medicine

## 2019-06-13 ENCOUNTER — Encounter: Payer: Self-pay | Admitting: Family Medicine

## 2019-06-13 NOTE — Telephone Encounter (Signed)
Forwarding to [place] to review.

## 2019-06-22 ENCOUNTER — Encounter: Payer: Self-pay | Admitting: Emergency Medicine

## 2019-06-22 ENCOUNTER — Emergency Department
Admission: EM | Admit: 2019-06-22 | Discharge: 2019-06-22 | Disposition: A | Discharge status: Home / Self Care | Payer: Self-pay | Attending: Emergency Medicine | Admitting: Emergency Medicine

## 2019-06-22 DIAGNOSIS — M5441 Lumbago with sciatica, right side: Secondary | ICD-10-CM | POA: Insufficient documentation

## 2019-06-22 DIAGNOSIS — Y93E9 Activity, other interior property and clothing maintenance: Secondary | ICD-10-CM | POA: Insufficient documentation

## 2019-06-22 DIAGNOSIS — X509XXA Other and unspecified overexertion or strenuous movements or postures, initial encounter: Secondary | ICD-10-CM | POA: Insufficient documentation

## 2019-06-22 DIAGNOSIS — E119 Type 2 diabetes mellitus without complications: Secondary | ICD-10-CM | POA: Insufficient documentation

## 2019-06-22 DIAGNOSIS — Y999 Unspecified external cause status: Secondary | ICD-10-CM | POA: Insufficient documentation

## 2019-06-22 DIAGNOSIS — G8929 Other chronic pain: Secondary | ICD-10-CM | POA: Insufficient documentation

## 2019-06-22 DIAGNOSIS — Z794 Long term (current) use of insulin: Secondary | ICD-10-CM | POA: Insufficient documentation

## 2019-06-22 DIAGNOSIS — F17210 Nicotine dependence, cigarettes, uncomplicated: Secondary | ICD-10-CM | POA: Insufficient documentation

## 2019-06-22 DIAGNOSIS — Y92009 Unspecified place in unspecified non-institutional (private) residence as the place of occurrence of the external cause: Secondary | ICD-10-CM | POA: Insufficient documentation

## 2019-06-22 DIAGNOSIS — M5442 Lumbago with sciatica, left side: Secondary | ICD-10-CM | POA: Insufficient documentation

## 2019-06-22 MED ORDER — LIDOCAINE 5 % EX PTCH: 1.0000 | MEDICATED_PATCH | Freq: Two times a day (BID) | CUTANEOUS | 0 refills | Status: DC

## 2019-06-22 MED ORDER — KETOROLAC TROMETHAMINE 60 MG/2ML IM SOLN
15.0000 mg | Freq: Once | INTRAMUSCULAR | Status: AC
  Administered 2019-06-22: 08:00:00 15 mg via INTRAMUSCULAR
  Filled 2019-06-22: qty 2

## 2019-06-22 MED ORDER — GABAPENTIN 100 MG PO CAPS: ORAL_CAPSULE | ORAL | 0 refills | Status: DC

## 2019-06-22 NOTE — ED Notes (Signed)
Pt states she takes percocet 5/325 6x per day, states she is prescribed 4x/day but takes more. Pt states she ran out of medication 1 month ago. Does not currently have provider to prescribe her pain medication states she "fired" provider.  This RN present in room during EDP exam.  EDP exam interrupted for critical patient in another room and patient advised that there may be a delay. Pt given cup of water at this time.

## 2019-06-22 NOTE — ED Triage Notes (Signed)
Patient to ER for c/o continued back pain. Patient states she bent over to clean up urine from the floor and heard a crunch. 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 Patient has appointment with her specialist on Monday, but is out of pain medication.

## 2019-06-22 NOTE — ED Notes (Signed)
Pt refused discharge vitals and to sign discharge E-signature pad. Pt taken out to lobby for ride with her brother via wheelchair.

## 2019-06-22 NOTE — ED Provider Notes (Signed)
[facility] Emergency Department Provider Note  ____________________________________________  Time seen: Approximately 7:47 AM  I have reviewed the triage vital signs and the nursing notes.   HISTORY  Chief Complaint Back Pain    HPI Laura Small is a 58 y.o. female  With a history of degenerative joint disease, diabetes, chronic back pain for the past 3 years who comes the ED complaining of chronic back pain for the past 3 years approximately.  She notes that pain was worse last night when she leaned over to clean up the floor.  In contrast, she also states it is not different and never gets better or worse, just constant all the time.  Radiates to bilateral legs.  Worse with movement, relieved by nothing.  Rates it as severe, as it always is.  No new falls or injuries.   Review of electronic medical record shows that she has had CT imaging and MRI of the lumbar spine both showing only mild disease.  She is followed up with primary care, been referred to a spine specialist but not yet seen them.  She reports having an appointment with spine in 5 days.  She also reports being out of pain medicine.  Review controlled substance reporting system shows that she had been receiving monthly prescriptions for Percocet by primary care.  However she reports that she fired her primary care doctor because he was too concerned about cancer and COVID and wanted her to have preventive maintenance testing like a mammogram and colonoscopy.  She seems to have found this offensive.     Past Medical History:  Diagnosis Date  . Back pain   . Chicken pox   . Diabetes mellitus without complication ([place])   . DJD (degenerative joint disease)   . Migraines   . PONV (postoperative nausea and vomiting)    usually has 1 episode of vomiting within 1 hour of anesthesia      Patient Active Problem List   Diagnosis Date Noted  . Elevated LFTs 06/16/2018  . Lower extremity  weakness 06/10/2018  . Elevated BP without diagnosis of hypertension 04/14/2018  . Depression 10/12/2017  . Muscle spasms of both lower extremities 10/12/2017  . Aortic atherosclerosis ([place]) 01/08/2017  . Tobacco abuse 01/08/2017  . Ileitis 01/08/2017  . DM (diabetes mellitus), type 2 ([place]) 09/23/2016  . Hyperlipidemia 09/23/2016  . Chronic low back pain 09/08/2016     Past Surgical History:  Procedure Laterality Date  . BACK SURGERY    . CHOLECYSTECTOMY    . INSERTION OF MESH N/A 02/12/2015   Procedure: INSERTION OF MESH;  Surgeon: Sherri Rad, MD;  Location: [facility] ORS;  Service: General;  Laterality: N/A;  . VENTRAL HERNIA REPAIR N/A 02/12/2015   Procedure: HERNIA REPAIR VENTRAL ADULT;  Surgeon: Sherri Rad, MD;  Location: [facility] ORS;  Service: General;  Laterality: N/A;     Prior to Admission medications   Medication Sig Start Date End Date Taking? Authorizing Provider  gabapentin (NEURONTIN) 100 MG capsule Start with 100mg  three times daily by mouth for one week.  If symptoms are not improved, you may then increase the dose to 200mg  by mouth three times daily.  Remain at this dose until you follow up with primary care. 06/22/19   Carrie Mew, MD  Insulin Glargine [facility]) 100 UNIT/ML SOPN Inject 40 Units into the skin daily.  10/15/17   [provider]  Insulin Pen Needle 32G X 6 MM MISC Use as directed for  once daily insulin. E11.9 06/11/18   Alford Highland, MD  lidocaine (LIDODERM) 5 % Place 1 patch onto the skin every 12 (twelve) hours. Remove & Discard patch within 12 hours or as directed by MD 06/22/19   Sharman Cheek, MD  oxyCODONE-acetaminophen (PERCOCET/ROXICET) 5-325 MG tablet TAKE 1 TABLET BY MOUTH EVERY 6 HOURS AS NEEDED PAIN 05/23/19   Glori Luis, MD     Allergies Bee venom, Advil [ibuprofen], Jardiance [empagliflozin], Nitroglycerin, and Prednisone   Family History  Problem Relation Age of Onset  . Diabetes Mother   . Gallbladder  disease Mother   . Heart disease Father   . Alcohol abuse Father   . Gallbladder disease Father   . Heart disease Paternal Grandmother   . Colon cancer Neg Hx     Social History Social History   Tobacco Use  . Smoking status: Current Every Day Smoker    Packs/day: 0.00    Types: Cigarettes  . Smokeless tobacco: Never Used  Substance Use Topics  . Alcohol use: No  . Drug use: No    Review of Systems  Constitutional:   No fever or chills.  ENT:   No sore throat. No rhinorrhea. Cardiovascular:   No chest pain or syncope. Respiratory:   No dyspnea or cough. Gastrointestinal:   Negative for abdominal pain, vomiting and diarrhea.  Musculoskeletal: Chronic back pain All other systems reviewed and are negative except as documented above in ROS and HPI.  ____________________________________________   PHYSICAL EXAM:  VITAL SIGNS: ED Triage Vitals [06/22/19 0456]  Enc Vitals Group     BP (!) 153/83     Pulse Rate 97     Resp 18     Temp 98.2 F (36.8 C)     Temp Source Oral     SpO2 97 %     Weight 200 lb (90.7 kg)     Height 5\' 7"  (1.702 m)     Head Circumference      Peak Flow      Pain Score 10     Pain Loc      Pain Edu?      Excl. in GC?     Vital signs reviewed, nursing assessments reviewed.   Constitutional:   Alert and oriented. Non-toxic appearance. Eyes:   Conjunctivae are normal. EOMI. ENT      Head:   Normocephalic and atraumatic.      Nose:   Wearing a mask.      Mouth/Throat:   Wearing a mask.      Neck:   No meningismus. Full ROM. Hematological/Lymphatic/Immunilogical:   No cervical lymphadenopathy. Cardiovascular:   RRR. Symmetric bilateral DP and PT pulses.  No murmurs. Respiratory:   Normal respiratory effort without tachypnea/retractions. Musculoskeletal:   Normal range of motion in all extremities. No joint effusions.  No lower extremity tenderness.  No edema. Neurologic:   Normal speech and language.  Motor grossly intact. Normal  patellar reflexes, no clonus. EHL positive, symmetric muscle strength No acute focal neurologic deficits are appreciated.  Skin:    Skin is warm, dry and intact. No rash noted.  No petechiae, purpura, or bullae.  ____________________________________________    LABS (pertinent positives/negatives) (all labs ordered are listed, but only abnormal results are displayed) Labs Reviewed - No data to display ____________________________________________   EKG    ____________________________________________    RADIOLOGY  No results found.  ____________________________________________   PROCEDURES Procedures  ____________________________________________    CLINICAL IMPRESSION / ASSESSMENT  AND PLAN / ED COURSE  Medications ordered in the ED: Medications  ketorolac (TORADOL) injection 15 mg (15 mg Intramuscular Given 06/22/19 0739)    Pertinent labs & imaging results that were available during my care of the patient were reviewed by me and considered in my medical decision making (see chart for details).  Laura Small was evaluated in Emergency Department on 06/22/2019 for the symptoms described in the history of present illness. She was evaluated in the context of the global COVID-19 pandemic, which necessitated consideration that the patient might be at risk for infection with the SARS-CoV-2 virus that causes COVID-19. Institutional protocols and algorithms that pertain to the evaluation of patients at risk for COVID-19 are in a state of rapid change based on information released by regulatory bodies including the CDC and federal and state organizations. These policies and algorithms were followed during the patient's care in the ED.   Patient reports exacerbation of her chronic pain.  No new symptoms.  Presentation is not consistent with a central cord syndrome/cauda equina.  Doubt meningitis or encephalitis or acute fracture.  Stable for continued outpatient follow-up  with spine specialist with her appointment that she reports is in 5 days.  Counseled her on inability of our ED to provide ongoing opioid prescriptions as per hospital policy.  Give her a dose of IM Toradol, start lidocaine patch and gabapentin for hopefully opioid sparing pain relief.  ----------------------------------------- 8:09 AM on 06/22/2019 -----------------------------------------  Patient wanted to talk to me again about pain management strategy.  She notes that she has been on gabapentin up to 900 mg a day in the past and it was ineffective for her pain.  She is also taking lidocaine patches before and says it is as useful as "afford an oral wound."  She says that she manages will go get euthanized.  Emphasized that these of the pain management options available to us today and encouraged her to follow-up with spine specialist soon as possible.  She states that she is ready to go and will walk out to her car.      ____________________________________________   FINAL CLINICAL IMPRESSION(S) / ED DIAGNOSES    Final diagnoses:  Chronic bilateral low back pain with bilateral sciatica     ED Discharge Orders         Ordered    gabapentin (NEURONTIN) 100 MG capsule  Status:  Discontinued     06/22/19 0734    lidocaine (LIDODERM) 5 %  Every 12 hours,   Status:  Discontinued     06/22/19 0734    gabapentin (NEURONTIN) 100 MG capsule     06/22/19 0748    lidocaine (LIDODERM) 5 %  Every 12 hours     06/22/19 0748          Portions of this note were generated with dragon dictation software. Dictation errors may occur despite best attempts at proofreading.   Sharman CheekStafford, Marleny Faller, MD 06/22/19 [id number]

## 2019-06-22 NOTE — Discharge Instructions (Signed)
Please follow up with the orthopedic spine specialists as scheduled.  We have prescribed some medications that you can try in the meantime.  Continue taking your other home medications.

## 2019-06-25 ENCOUNTER — Other Ambulatory Visit: Payer: Self-pay | Admitting: Family Medicine

## 2019-06-27 NOTE — Telephone Encounter (Signed)
This patient is no longer a patient here. Please call the pharmacy and see if this was an automatic request. Please inform them that this patient is no longer a patient at our office. Thanks.

## 2019-12-23 ENCOUNTER — Other Ambulatory Visit: Payer: Self-pay

## 2019-12-23 ENCOUNTER — Emergency Department
Admission: EM | Admit: 2019-12-23 | Discharge: 2019-12-24 | Disposition: A | Discharge status: Home / Self Care | Payer: Self-pay | Attending: Emergency Medicine | Admitting: Emergency Medicine

## 2019-12-23 DIAGNOSIS — F17210 Nicotine dependence, cigarettes, uncomplicated: Secondary | ICD-10-CM | POA: Insufficient documentation

## 2019-12-23 DIAGNOSIS — Z794 Long term (current) use of insulin: Secondary | ICD-10-CM | POA: Insufficient documentation

## 2019-12-23 DIAGNOSIS — M5441 Lumbago with sciatica, right side: Secondary | ICD-10-CM | POA: Insufficient documentation

## 2019-12-23 DIAGNOSIS — G8929 Other chronic pain: Secondary | ICD-10-CM

## 2019-12-23 DIAGNOSIS — E119 Type 2 diabetes mellitus without complications: Secondary | ICD-10-CM | POA: Insufficient documentation

## 2019-12-23 DIAGNOSIS — M5442 Lumbago with sciatica, left side: Secondary | ICD-10-CM | POA: Insufficient documentation

## 2019-12-23 NOTE — ED Notes (Signed)
Pt reports she has been having lumbar pain radiating into her legs. Pt reports pain is equal bilaterally. Pt reports pain has been constant over the past 3 years, before letting up in October. Pt reports sudden decrease in symptoms since then, with flare ups beginning approx 3 days ago.   Pt reports decreased sensation in lower extremities, which is new onset.   Pt reports that she has had MRI's, CT scans, and met with specialists for years with no conclusive diagnosis.   Pt reports pain is now getting worse than it had been before october

## 2019-12-23 NOTE — ED Triage Notes (Signed)
Pt states is here with "nerve pain". Pt complains of low back pain radiating down both legs. Pt arrives in own wheelchair. Pt states history of same for three years.

## 2019-12-24 MED ORDER — LIDOCAINE 5 % EX PTCH: 1.0000 | MEDICATED_PATCH | Freq: Two times a day (BID) | CUTANEOUS | 0 refills | Status: DC

## 2019-12-24 MED ORDER — OXYCODONE-ACETAMINOPHEN 5-325 MG PO TABS: 1.0000 | ORAL_TABLET | Freq: Four times a day (QID) | ORAL | 0 refills | Status: AC | PRN

## 2019-12-24 MED ORDER — OXYCODONE-ACETAMINOPHEN 5-325 MG PO TABS
1.0000 | ORAL_TABLET | Freq: Once | ORAL | Status: AC
  Administered 2019-12-24: 03:00:00 1 via ORAL
  Filled 2019-12-24: qty 1

## 2019-12-24 NOTE — ED Notes (Addendum)
Pt reports she has been having problems with continence since the pain started 3 days ago, which had been a problem pre-October as well  Pt reports nodule on lower back, which she says has shown up on the MRIs

## 2019-12-24 NOTE — ED Provider Notes (Signed)
[facility] Emergency Department Provider Note  ____________________________________________  Time seen: Approximately 3:29 AM  I have reviewed the triage vital signs and the nursing notes.   HISTORY  Chief Complaint Leg Pain and Back Pain    HPI Laura Small is a 59 y.o. female with a history of chronic low back pain and bilateral sciatica, diabetes who comes the ED complaining of low back pain for the past 3 days with bilateral sciatica.  I have previously seen this patient in September 2020.  Since then, she reports that her pain and other symptoms resolved in October 2020 and she was up to walking a mile a day and working on weight loss.  Does not seem to have had regular medical follow-up since that last ED visit.  Currently not taking any pain medications.   She reports that 3 days ago, she was in her usual state of health, felt a pop in her lower back with return of the sciatica back pain.  Also reports perineum sensation with wiping, voiding, defecating is "distant," but does not have focal weakness, is able to walk.  She reports that this diminished sensation was previously present in the past as well as part of her syndrome.  At the time in the past, she had extensive MRIs and other evaluations which were all relatively unremarkable.  She reports that she plans to see a neurologist in [place] within the next month for EMGs and further evaluation.     Past Medical History:  Diagnosis Date  . Back pain   . Chicken pox   . Diabetes mellitus without complication ([place])   . DJD (degenerative joint disease)   . Migraines   . PONV (postoperative nausea and vomiting)    usually has 1 episode of vomiting within 1 hour of anesthesia      Patient Active Problem List   Diagnosis Date Noted  . Elevated LFTs 06/16/2018  . Lower extremity weakness 06/10/2018  . Elevated BP without diagnosis of hypertension 04/14/2018  . Depression  10/12/2017  . Muscle spasms of both lower extremities 10/12/2017  . Aortic atherosclerosis ([place]) 01/08/2017  . Tobacco abuse 01/08/2017  . Ileitis 01/08/2017  . DM (diabetes mellitus), type 2 ([place]) 09/23/2016  . Hyperlipidemia 09/23/2016  . Chronic low back pain 09/08/2016     Past Surgical History:  Procedure Laterality Date  . BACK SURGERY    . CHOLECYSTECTOMY    . INSERTION OF MESH N/A 02/12/2015   Procedure: INSERTION OF MESH;  Surgeon: Sherri Rad, MD;  Location: [facility] ORS;  Service: General;  Laterality: N/A;  . VENTRAL HERNIA REPAIR N/A 02/12/2015   Procedure: HERNIA REPAIR VENTRAL ADULT;  Surgeon: Sherri Rad, MD;  Location: [facility] ORS;  Service: General;  Laterality: N/A;     Prior to Admission medications   Medication Sig Start Date End Date Taking? Authorizing Provider  Insulin Glargine (BASAGLAR KWIKPEN) 100 UNIT/ML SOPN Inject 50 Units into the skin daily.  10/15/17  Yes [provider]  Insulin Pen Needle 32G X 6 MM MISC Use as directed for once daily insulin. E11.9 06/11/18  Yes Wieting, Richard, MD  lidocaine (LIDODERM) 5 % Place 1 patch onto the skin every 12 (twelve) hours. Remove & Discard patch within 12 hours or as directed by MD 12/24/19   Carrie Mew, MD  oxyCODONE-acetaminophen (PERCOCET) 5-325 MG tablet Take 1 tablet by mouth every 6 (six) hours as needed for up to 3 days for severe pain. 12/24/19 12/27/19  Sharman Cheek, MD     Allergies Bee venom, Advil [ibuprofen], Jardiance [empagliflozin], Nitroglycerin, and Prednisone   Family History  Problem Relation Age of Onset  . Diabetes Mother   . Gallbladder disease Mother   . Heart disease Father   . Alcohol abuse Father   . Gallbladder disease Father   . Heart disease Paternal Grandmother   . Colon cancer Neg Hx     Social History Social History   Tobacco Use  . Smoking status: Current Every Day Smoker    Packs/day: 0.00    Types: Cigarettes  . Smokeless tobacco: Never Used  Substance  Use Topics  . Alcohol use: No  . Drug use: No    Review of Systems  Constitutional:   No fever or chills.  ENT:   No sore throat. No rhinorrhea. Cardiovascular:   No chest pain or syncope. Respiratory:   No dyspnea or cough. Gastrointestinal:   Negative for abdominal pain, vomiting and diarrhea.  Musculoskeletal: Positive acute on chronic low back pain with bilateral sciatica All other systems reviewed and are negative except as documented above in ROS and HPI.  ____________________________________________   PHYSICAL EXAM:  VITAL SIGNS: ED Triage Vitals  Enc Vitals Group     BP 12/23/19 2243 (!) 153/78     Pulse Rate 12/23/19 2243 93     Resp 12/23/19 2243 14     Temp 12/23/19 2243 98.9 F (37.2 C)     Temp Source 12/23/19 2243 Oral     SpO2 12/23/19 2243 100 %     Weight 12/23/19 2244 225 lb (102.1 kg)     Height 12/23/19 2244 5\' 7"  (1.702 m)     Head Circumference --      Peak Flow --      Pain Score 12/23/19 2244 10     Pain Loc --      Pain Edu? --      Excl. in GC? --     Vital signs reviewed, nursing assessments reviewed.   Constitutional:   Alert and oriented. Non-toxic appearance. Eyes:   Conjunctivae are normal. EOMI. PERRL. ENT      Head:   Normocephalic and atraumatic.      Nose:   Wearing a mask.      Mouth/Throat:   Wearing a mask.      Neck:   No meningismus. Full ROM. Hematological/Lymphatic/Immunilogical:   No cervical lymphadenopathy. Cardiovascular:   RRR. Symmetric bilateral radial and DP pulses.  No murmurs. Cap refill less than 2 seconds. Respiratory:   Normal respiratory effort without tachypnea/retractions. Breath sounds are clear and equal bilaterally. No wheezes/rales/rhonchi. Gastrointestinal:   Soft and nontender. Non distended. There is no CVA tenderness.  No rebound, rigidity, or guarding.  Musculoskeletal:   Normal range of motion in all extremities. No joint effusions.  No lower extremity tenderness.  No edema.  No midline spinal  tenderness.  No low back tenderness.  Pain with hip flexion bilaterally Neurologic:   Normal speech and language.  Motor grossly intact. Plantar reflex downgoing bilaterally. 1+ patellar reflex bilaterally, no clonus No acute focal neurologic deficits are appreciated.  Skin:    Skin is warm, dry and intact. No rash noted.  No petechiae, purpura, or bullae.  ____________________________________________    LABS (pertinent positives/negatives) (all labs ordered are listed, but only abnormal results are displayed) Labs Reviewed - No data to display ____________________________________________   EKG    ____________________________________________    RADIOLOGY  No results  found.  ____________________________________________   PROCEDURES Procedures  ____________________________________________  DIFFERENTIAL DIAGNOSIS   Lumbar disc herniation, degenerative disease of the lumbar spine with sciatica bilaterally, cauda equina syndrome  CLINICAL IMPRESSION / ASSESSMENT AND PLAN / ED COURSE  Medications ordered in the ED: Medications  oxyCODONE-acetaminophen (PERCOCET/ROXICET) 5-325 MG per tablet 1 tablet (1 tablet Oral Given 12/24/19 0241)    Pertinent labs & imaging results that were available during my care of the patient were reviewed by me and considered in my medical decision making (see chart for details).  Jazzmen Restivo was evaluated in Emergency Department on 12/24/2019 for the symptoms described in the history of present illness. She was evaluated in the context of the global COVID-19 pandemic, which necessitated consideration that the patient might be at risk for infection with the SARS-CoV-2 virus that causes COVID-19. Institutional protocols and algorithms that pertain to the evaluation of patients at risk for COVID-19 are in a state of rapid change based on information released by regulatory bodies including the CDC and federal and state organizations. These  policies and algorithms were followed during the patient's care in the ED.   Patient presents with acute resurgence of chronic pain syndrome without acute trauma or other injury.  She reports that she had been symptom-free for several months before the last 3 days.  Physical exam is nonfocal, but as she does report diminished sensation in the groin and decreased awareness of passing urine and moving bowels, I recommended MRI to evaluate for cauda equina/central cord syndrome which patient declines.  I do think the likelihood of such as severe condition is low given that her symptoms are entirely consistent with her previous syndrome and she has had extensive reassuring work-up in this setting in the past.  Electronic medical records were reviewed, prior MRIs of the brain and spine unremarkable  History including her complaints about primary care and reports of recent specialist visits and future specialist appointment is quite similar to last time she came in September 2020.  Prescription drug monitoring program database look up shows no controlled substance prescriptions in the last 6 months, so I will give a limited prescription of Percocet for acute pain control.  Recommend close follow-up with primary care and pain management clinic, and continued specialist evaluation which she reports having appointments for.  Did advise her on our very limited ability to manage chronic pain with controlled substances in the ED.      ____________________________________________   FINAL CLINICAL IMPRESSION(S) / ED DIAGNOSES    Final diagnoses:  Chronic midline low back pain with bilateral sciatica     ED Discharge Orders         Ordered    oxyCODONE-acetaminophen (PERCOCET) 5-325 MG tablet  Every 6 hours PRN     12/24/19 0325    lidocaine (LIDODERM) 5 %  Every 12 hours     12/24/19 0325          Portions of this note were generated with dragon dictation software. Dictation errors may occur  despite best attempts at proofreading.   Sharman Cheek, MD 12/24/19 [phone]

## 2020-06-20 ENCOUNTER — Inpatient Hospital Stay
Admission: EM | Admit: 2020-06-20 | Discharge: 2020-06-21 | DRG: 638 | Discharge status: Against Medical Advice | Payer: Self-pay | Attending: Internal Medicine | Admitting: Internal Medicine

## 2020-06-20 ENCOUNTER — Other Ambulatory Visit: Payer: Self-pay

## 2020-06-20 ENCOUNTER — Encounter: Payer: Self-pay | Admitting: Emergency Medicine

## 2020-06-20 DIAGNOSIS — Z993 Dependence on wheelchair: Secondary | ICD-10-CM

## 2020-06-20 DIAGNOSIS — G834 Cauda equina syndrome: Secondary | ICD-10-CM | POA: Diagnosis present

## 2020-06-20 DIAGNOSIS — T383X6A Underdosing of insulin and oral hypoglycemic [antidiabetic] drugs, initial encounter: Secondary | ICD-10-CM | POA: Diagnosis present

## 2020-06-20 DIAGNOSIS — M545 Low back pain, unspecified: Secondary | ICD-10-CM | POA: Diagnosis present

## 2020-06-20 DIAGNOSIS — Z6831 Body mass index (BMI) 31.0-31.9, adult: Secondary | ICD-10-CM

## 2020-06-20 DIAGNOSIS — F32A Depression, unspecified: Secondary | ICD-10-CM | POA: Diagnosis present

## 2020-06-20 DIAGNOSIS — Z20822 Contact with and (suspected) exposure to covid-19: Secondary | ICD-10-CM | POA: Diagnosis present

## 2020-06-20 DIAGNOSIS — F329 Major depressive disorder, single episode, unspecified: Secondary | ICD-10-CM | POA: Diagnosis present

## 2020-06-20 DIAGNOSIS — G8929 Other chronic pain: Secondary | ICD-10-CM | POA: Diagnosis present

## 2020-06-20 DIAGNOSIS — Z91120 Patient's intentional underdosing of medication regimen due to financial hardship: Secondary | ICD-10-CM

## 2020-06-20 DIAGNOSIS — E669 Obesity, unspecified: Secondary | ICD-10-CM | POA: Diagnosis present

## 2020-06-20 DIAGNOSIS — E1110 Type 2 diabetes mellitus with ketoacidosis without coma: Secondary | ICD-10-CM | POA: Diagnosis present

## 2020-06-20 DIAGNOSIS — D72829 Elevated white blood cell count, unspecified: Secondary | ICD-10-CM | POA: Diagnosis present

## 2020-06-20 DIAGNOSIS — R29898 Other symptoms and signs involving the musculoskeletal system: Secondary | ICD-10-CM | POA: Diagnosis present

## 2020-06-20 DIAGNOSIS — I10 Essential (primary) hypertension: Secondary | ICD-10-CM | POA: Diagnosis present

## 2020-06-20 DIAGNOSIS — Z5329 Procedure and treatment not carried out because of patient's decision for other reasons: Secondary | ICD-10-CM | POA: Diagnosis not present

## 2020-06-20 DIAGNOSIS — N179 Acute kidney failure, unspecified: Secondary | ICD-10-CM | POA: Diagnosis present

## 2020-06-20 DIAGNOSIS — M62838 Other muscle spasm: Secondary | ICD-10-CM | POA: Diagnosis present

## 2020-06-20 DIAGNOSIS — F17210 Nicotine dependence, cigarettes, uncomplicated: Secondary | ICD-10-CM | POA: Diagnosis present

## 2020-06-20 DIAGNOSIS — Z8249 Family history of ischemic heart disease and other diseases of the circulatory system: Secondary | ICD-10-CM

## 2020-06-20 DIAGNOSIS — Z833 Family history of diabetes mellitus: Secondary | ICD-10-CM

## 2020-06-20 DIAGNOSIS — Z794 Long term (current) use of insulin: Secondary | ICD-10-CM

## 2020-06-20 LAB — COMPREHENSIVE METABOLIC PANEL
ALT: 31 U/L (ref 0–44)
AST: 26 U/L (ref 15–41)
Albumin: 4.2 g/dL (ref 3.5–5.0)
Alkaline Phosphatase: 222 U/L — ABNORMAL HIGH (ref 38–126)
Anion gap: 24 — ABNORMAL HIGH (ref 5–15)
BUN: 24 mg/dL — ABNORMAL HIGH (ref 6–20)
CO2: 8 mmol/L — ABNORMAL LOW (ref 22–32)
Calcium: 9.1 mg/dL (ref 8.9–10.3)
Chloride: 89 mmol/L — ABNORMAL LOW (ref 98–111)
Creatinine, Ser: 1.26 mg/dL — ABNORMAL HIGH (ref 0.44–1.00)
GFR calc Af Amer: 55 mL/min — ABNORMAL LOW (ref >60)
GFR calc non Af Amer: 48 mL/min — ABNORMAL LOW (ref >60)
Glucose, Bld: 802 mg/dL (ref 70–99)
Potassium: 4.7 mmol/L (ref 3.5–5.1)
Sodium: 121 mmol/L — ABNORMAL LOW (ref 135–145)
Total Bilirubin: 2.8 mg/dL — ABNORMAL HIGH (ref 0.3–1.2)
Total Protein: 8.4 g/dL — ABNORMAL HIGH (ref 6.5–8.1)

## 2020-06-20 LAB — CBC
HCT: 52.3 % — ABNORMAL HIGH (ref 36.0–46.0)
Hemoglobin: 17.9 g/dL — ABNORMAL HIGH (ref 12.0–15.0)
MCH: 29.2 pg (ref 26.0–34.0)
MCHC: 34.2 g/dL (ref 30.0–36.0)
MCV: 85.5 fL (ref 80.0–100.0)
Platelets: 379 10*3/uL (ref 150–400)
RBC: 6.12 MIL/uL — ABNORMAL HIGH (ref 3.87–5.11)
RDW: 12.9 % (ref 11.5–15.5)
WBC: 17.5 10*3/uL — ABNORMAL HIGH (ref 4.0–10.5)
nRBC: 0 % (ref 0.0–0.2)

## 2020-06-20 LAB — BLOOD GAS, VENOUS
Acid-base deficit: 16.1 mmol/L — ABNORMAL HIGH (ref 0.0–2.0)
Bicarbonate: 7 mmol/L — ABNORMAL LOW (ref 20.0–28.0)
O2 Saturation: 94.6 %
Patient temperature: 37
pCO2, Ven: <19 mmHg — CL (ref 44.0–60.0)
pH, Ven: 7.28 (ref 7.250–7.430)
pO2, Ven: 83 mmHg — ABNORMAL HIGH (ref 32.0–45.0)

## 2020-06-20 LAB — TROPONIN I (HIGH SENSITIVITY): Troponin I (High Sensitivity): 5 ng/L (ref <18)

## 2020-06-20 LAB — LIPASE, BLOOD: Lipase: 64 U/L — ABNORMAL HIGH (ref 11–51)

## 2020-06-20 LAB — BASIC METABOLIC PANEL
Anion gap: 16 — ABNORMAL HIGH (ref 5–15)
BUN: 23 mg/dL — ABNORMAL HIGH (ref 6–20)
CO2: 13 mmol/L — ABNORMAL LOW (ref 22–32)
Calcium: 9.1 mg/dL (ref 8.9–10.3)
Chloride: 98 mmol/L (ref 98–111)
Creatinine, Ser: 0.84 mg/dL (ref 0.44–1.00)
GFR calc Af Amer: >60 mL/min (ref >60)
GFR calc non Af Amer: >60 mL/min (ref >60)
Glucose, Bld: 319 mg/dL — ABNORMAL HIGH (ref 70–99)
Potassium: 3.9 mmol/L (ref 3.5–5.1)
Sodium: 127 mmol/L — ABNORMAL LOW (ref 135–145)

## 2020-06-20 LAB — GLUCOSE, CAPILLARY
Glucose-Capillary: 339 mg/dL — ABNORMAL HIGH (ref 70–99)
Glucose-Capillary: 376 mg/dL — ABNORMAL HIGH (ref 70–99)
Glucose-Capillary: 492 mg/dL — ABNORMAL HIGH (ref 70–99)
Glucose-Capillary: >600 mg/dL (ref 70–99)
Glucose-Capillary: >600 mg/dL (ref 70–99)

## 2020-06-20 LAB — PHOSPHORUS: Phosphorus: 4.1 mg/dL (ref 2.5–4.6)

## 2020-06-20 LAB — MAGNESIUM: Magnesium: 2.3 mg/dL (ref 1.7–2.4)

## 2020-06-20 LAB — BETA-HYDROXYBUTYRIC ACID: Beta-Hydroxybutyric Acid: 7.46 mmol/L — ABNORMAL HIGH (ref 0.05–0.27)

## 2020-06-20 MED ORDER — DEXTROSE IN LACTATED RINGERS 5 % IV SOLN: INTRAVENOUS | Status: DC

## 2020-06-20 MED ORDER — INSULIN REGULAR(HUMAN) IN NACL 100-0.9 UT/100ML-% IV SOLN
INTRAVENOUS | Status: DC
  Administered 2020-06-20: 9.5 [IU]/h via INTRAVENOUS
  Administered 2020-06-21: 8.5 [IU]/h via INTRAVENOUS
  Filled 2020-06-20 (×2): qty 100

## 2020-06-20 MED ORDER — OXYCODONE HCL 5 MG PO TABS
5.0000 mg | ORAL_TABLET | Freq: Four times a day (QID) | ORAL | Status: DC | PRN
  Administered 2020-06-21 (×3): 5 mg via ORAL
  Filled 2020-06-20 (×3): qty 1

## 2020-06-20 MED ORDER — LACTATED RINGERS IV SOLN: INTRAVENOUS | Status: DC

## 2020-06-20 MED ORDER — DEXTROSE 50 % IV SOLN: 0.0000 mL | INTRAVENOUS | Status: DC | PRN

## 2020-06-20 MED ORDER — POTASSIUM CHLORIDE 10 MEQ/100ML IV SOLN
10.0000 meq | INTRAVENOUS | Status: DC
  Administered 2020-06-20: 10 meq via INTRAVENOUS

## 2020-06-20 MED ORDER — ENOXAPARIN SODIUM 40 MG/0.4ML ~~LOC~~ SOLN
40.0000 mg | SUBCUTANEOUS | Status: DC
  Administered 2020-06-20: 40 mg via SUBCUTANEOUS
  Filled 2020-06-20: qty 0.4

## 2020-06-20 MED ORDER — ONDANSETRON HCL 4 MG/2ML IJ SOLN
4.0000 mg | Freq: Once | INTRAMUSCULAR | Status: AC
  Administered 2020-06-20: 4 mg via INTRAVENOUS
  Filled 2020-06-20: qty 2

## 2020-06-20 MED ORDER — LACTATED RINGERS IV BOLUS
20.0000 mL/kg | Freq: Once | INTRAVENOUS | Status: AC
  Administered 2020-06-20: 1814 mL via INTRAVENOUS

## 2020-06-20 MED ORDER — INSULIN REGULAR(HUMAN) IN NACL 100-0.9 UT/100ML-% IV SOLN: INTRAVENOUS | Status: DC

## 2020-06-20 MED ORDER — LACTATED RINGERS IV BOLUS: 1000.0000 mL | INTRAVENOUS | Status: AC

## 2020-06-20 MED ORDER — POTASSIUM CHLORIDE 10 MEQ/100ML IV SOLN
10.0000 meq | INTRAVENOUS | Status: AC
  Administered 2020-06-20: 10 meq via INTRAVENOUS
  Filled 2020-06-20 (×2): qty 100

## 2020-06-20 NOTE — ED Triage Notes (Signed)
Pt presents to ED via POV, states "I haven't eaten in a week, the thought of food repulses me". Pt c/o dizziness, weakness, "heat" to her epigastric area. Pt presents to ED drinking water. Pt states has been able to drink water, but has not been able to eat. Pt states last BM was earlier today. Pt presents alert and oriented.   Pt states can eat fruit, but not strawberries.

## 2020-06-20 NOTE — ED Notes (Signed)
EDP bedside for [place] IV start.

## 2020-06-20 NOTE — ED Notes (Signed)
CBG >600. Insulin drip started per endotool at greatest reading shown on meter.  Drip verified by Prentice Docker, RN.

## 2020-06-20 NOTE — ED Provider Notes (Addendum)
[facility] Emergency Department Provider Note  ____________________________________________  Time seen: Approximately 6:52 PM  I have reviewed the triage vital signs and the nursing notes.   HISTORY  Chief Complaint Dizziness, Nausea, and Weakness    HPI Laura Small is a 59 y.o. female with a history of insulin-dependent diabetes and chronic back pain who comes the ED complaining of generalized weakness and lightheadedness with standing that has been worsening for the past week.  Reports that she ran out of insulin about 2 weeks ago, has had loss of appetite, vomiting.  Denies abdominal pain but has a feeling of warmth over the abdomen.  Had a normal bowel movement today.  No chest pain or shortness of breath, no fevers, no syncope or trauma.  Symptoms are constant without aggravating or alleviating factors.      Past Medical History:  Diagnosis Date  . Back pain   . Chicken pox   . Diabetes mellitus without complication (HCC)   . DJD (degenerative joint disease)   . Migraines   . PONV (postoperative nausea and vomiting)    usually has 1 episode of vomiting within 1 hour of anesthesia      Patient Active Problem List   Diagnosis Date Noted  . Elevated LFTs 06/16/2018  . Lower extremity weakness 06/10/2018  . Elevated BP without diagnosis of hypertension 04/14/2018  . Depression 10/12/2017  . Muscle spasms of both lower extremities 10/12/2017  . Aortic atherosclerosis (HCC) 01/08/2017  . Tobacco abuse 01/08/2017  . Ileitis 01/08/2017  . DM (diabetes mellitus), type 2 (HCC) 09/23/2016  . Hyperlipidemia 09/23/2016  . Chronic low back pain 09/08/2016     Past Surgical History:  Procedure Laterality Date  . BACK SURGERY    . CHOLECYSTECTOMY    . INSERTION OF MESH N/A 02/12/2015   Procedure: INSERTION OF MESH;  Surgeon: Natale LayMark Bird, MD;  Location: [facility] ORS;  Service: General;  Laterality: N/A;  . VENTRAL HERNIA REPAIR N/A 02/12/2015    Procedure: HERNIA REPAIR VENTRAL ADULT;  Surgeon: Natale LayMark Bird, MD;  Location: [facility] ORS;  Service: General;  Laterality: N/A;     Prior to Admission medications   Medication Sig Start Date End Date Taking? Authorizing Provider  Insulin Glargine (BASAGLAR KWIKPEN) 100 UNIT/ML SOPN Inject 50 Units into the skin daily.  10/15/17   [provider]     Allergies Bee venom, Advil [ibuprofen], Jardiance [empagliflozin], Nitroglycerin, and Prednisone   Family History  Problem Relation Age of Onset  . Diabetes Mother   . Gallbladder disease Mother   . Heart disease Father   . Alcohol abuse Father   . Gallbladder disease Father   . Heart disease Paternal Grandmother   . Colon cancer Neg Hx     Social History Social History   Tobacco Use  . Smoking status: Current Every Day Smoker    Packs/day: 0.00    Types: Cigarettes  . Smokeless tobacco: Never Used  Substance Use Topics  . Alcohol use: No  . Drug use: No    Review of Systems  Constitutional:   No fever or chills.  ENT:   No sore throat. No rhinorrhea. Cardiovascular:   No chest pain or syncope. Respiratory:   No dyspnea or cough. Gastrointestinal:   Negative for abdominal pain, positive vomiting Musculoskeletal:   Negative for focal pain or swelling All other systems reviewed and are negative except as documented above in ROS and HPI.  ____________________________________________   PHYSICAL EXAM:  VITAL  SIGNS: ED Triage Vitals  Enc Vitals Group     BP 06/20/20 1640 136/79     Pulse Rate 06/20/20 1640 (!) 108     Resp 06/20/20 1640 20     Temp 06/20/20 1640 (!) 97.1 F (36.2 C)     Temp Source 06/20/20 1640 Axillary     SpO2 06/20/20 1640 99 %     Weight 06/20/20 1641 200 lb (90.7 kg)     Height 06/20/20 1641 5\' 7"  (1.702 m)     Head Circumference --      Peak Flow --      Pain Score 06/20/20 1641 0     Pain Loc --      Pain Edu? --      Excl. in GC? --     Vital signs reviewed, nursing  assessments reviewed.   Constitutional:   Alert and oriented. Non-toxic appearance. Eyes:   Conjunctivae are normal. EOMI. PERRL. ENT      Head:   Normocephalic and atraumatic.      Nose: Normal.      Mouth/Throat: Dry mucous membranes      Neck:   No meningismus. Full ROM. Hematological/Lymphatic/Immunilogical:   No cervical lymphadenopathy. Cardiovascular:   Tachycardia heart rate 110. Symmetric bilateral radial and DP pulses.  No murmurs. Cap refill less than 2 seconds. Respiratory: Tachypnea with normal work of breathing, no wheezes or crackles. Gastrointestinal:   Soft and nontender. Non distended. There is no CVA tenderness.  No rebound, rigidity, or guarding.  Musculoskeletal:   Normal range of motion in all extremities. No joint effusions.  No lower extremity tenderness.  No edema. Neurologic:   Normal speech and language.  Motor grossly intact. No acute focal neurologic deficits are appreciated.  Skin:    Skin is warm, dry and intact. No rash noted.  No petechiae, purpura, or bullae.  ____________________________________________    LABS (pertinent positives/negatives) (all labs ordered are listed, but only abnormal results are displayed) Labs Reviewed  LIPASE, BLOOD - Abnormal; Notable for the following components:      Result Value   Lipase 64 (*)    All other components within normal limits  COMPREHENSIVE METABOLIC PANEL - Abnormal; Notable for the following components:   Sodium 121 (*)    Chloride 89 (*)    CO2 8 (*)    Glucose, Bld 802 (*)    BUN 24 (*)    Creatinine, Ser 1.26 (*)    Total Protein 8.4 (*)    Alkaline Phosphatase 222 (*)    Total Bilirubin 2.8 (*)    GFR calc non Af Amer 48 (*)    GFR calc Af Amer 55 (*)    Anion gap 24 (*)    All other components within normal limits  CBC - Abnormal; Notable for the following components:   WBC 17.5 (*)    RBC 6.12 (*)    Hemoglobin 17.9 (*)    HCT 52.3 (*)    All other components within normal limits   BLOOD GAS, VENOUS - Abnormal; Notable for the following components:   pCO2, Ven <19.0 (*)    pO2, Ven 83.0 (*)    Bicarbonate 7.0 (*)    Acid-base deficit 16.1 (*)    All other components within normal limits  GLUCOSE, CAPILLARY - Abnormal; Notable for the following components:   Glucose-Capillary >600 (*)    All other components within normal limits  RESPIRATORY PANEL BY RT PCR (FLU A&B, COVID)  MAGNESIUM  URINALYSIS, COMPLETE (UACMP) WITH MICROSCOPIC  BETA-HYDROXYBUTYRIC ACID  POC URINE PREG, ED  TROPONIN I (HIGH SENSITIVITY)   ____________________________________________   EKG  Interpreted by me Sinus tachycardia rate 107.  Right axis, right bundle branch block.  No acute ischemic changes.  Prolonged QTC of 5 3 4  ms.  ____________________________________________    RADIOLOGY  No results found.  ____________________________________________   PROCEDURES .Critical Care Performed by: , MD Authorized by: Sharman Cheek, MD   Critical care provider statement:    Critical care time (minutes):  35   Critical care time was exclusive of:  Separately billable procedures and treating other patients   Critical care was necessary to treat or prevent imminent or life-threatening deterioration of the following conditions:  Dehydration, endocrine crisis and metabolic crisis   Critical care was time spent personally by me on the following activities:  Development of treatment plan with patient or surrogate, discussions with consultants, evaluation of patient's response to treatment, examination of patient, obtaining history from patient or surrogate, ordering and performing treatments and interventions, ordering and review of laboratory studies, ordering and review of radiographic studies, pulse oximetry, re-evaluation of patient's condition and review of old charts Comments:         Angiocath insertion  Date/Time: 06/20/2020 7:43 PM Performed by: 06/22/2020, MD Authorized by: Sharman Cheek, MD  Preparation: Patient was prepped and draped in the usual sterile fashion. Local anesthesia used: no  Anesthesia: Local anesthesia used: no  Sedation: Patient sedated: no  Patient tolerance: patient tolerated the procedure well with no immediate complications Comments: 1 attempt, continuous Sharman Cheek vis. 20g R forearm. EBL 0     ____________________________________________    CLINICAL IMPRESSION / ASSESSMENT AND PLAN / ED COURSE  Medications ordered in the ED: Medications  insulin regular, human (MYXREDLIN) 100 units/ 100 mL infusion (has no administration in time range)  lactated ringers infusion (has no administration in time range)  dextrose 5 % in lactated ringers infusion (has no administration in time range)  dextrose 50 % solution 0-50 mL (has no administration in time range)  lactated ringers bolus 1,000 mL (has no administration in time range)  potassium chloride 10 mEq in 100 mL IVPB (has no administration in time range)  ondansetron (ZOFRAN) injection 4 mg (has no administration in time range)    Pertinent labs & imaging results that were available during my care of the patient were reviewed by me and considered in my medical decision making (see chart for details).  Villa Burgin was evaluated in Emergency Department on 06/20/2020 for the symptoms described in the history of present illness. She was evaluated in the context of the global COVID-19 pandemic, which necessitated consideration that the patient might be at risk for infection with the SARS-CoV-2 virus that causes COVID-19. Institutional protocols and algorithms that pertain to the evaluation of patients at risk for COVID-19 are in a state of rapid change based on information released by regulatory bodies including the CDC and federal and state organizations. These policies and algorithms were followed during the patient's care in the ED.   Patient with  IDDM, out of insulin for 2 weeks, presents with generalized weakness and lightheadedness.  Labs and exam consistent with DKA.  Not septic.  We will start insulin infusion per protocol, 2 L IV fluid bolus for hydration, potassium replacement.  Add on magnesium level.  Will need to admit to ICU.      ____________________________________________   FINAL CLINICAL  IMPRESSION(S) / ED DIAGNOSES    Final diagnoses:  Diabetic ketoacidosis without coma associated with type 2 diabetes mellitus [facility])     ED Discharge Orders    None      Portions of this note were generated with dragon dictation software. Dictation errors may occur despite best attempts at proofreading.   Sharman Cheek, MD 06/20/20 Vinnie Langton    Sharman Cheek, MD 06/20/20 1944

## 2020-06-20 NOTE — H&P (Addendum)
[facility] History and Physical   Patient: Laura Small NUU:[id number]   PCP: Pcp, No DOB: 07-Mar-1961   DOA: 06/20/2020   DOS: 06/20/2020   DOS: the patient was seen and examined on 06/20/2020   Patient coming from: The patient is coming from Home  Chief Complaint: Feeling weak and dizzy  HPI: Laura Small is a 59 y.o. female with Past medical history of IDDM T2, hypertension, chronic back pain, pyriform syndrome/nerve compression, cauda equina with fecal and urinary incontinence, wheelchair-bound, presented at [facility] ED due to feeling weak and dizzy. Patient stated that she is hungry feels tired and thirsty. Patient ran out of insulin for past 3 weeks and she said that she could not afford due to financial issues and has no insurance. Patient did vomit yesterday and today few episodes, denied any abdominal pain, no shortness of breath, no chest pain or palpitations. Patient has chronic backache and requesting strong pain medications especially opiates.   ED Course: Patient was found to have tachycardia, hyperglycemia blood glucose 800, sodium 121 pseudohyponatremia, acidosis bicarbonate 8, anion gap 24, AKI creatinine 1.26, elevated alk phos and lipase. Leukocytosis most likely reactive, beta hydroxybutyrate acid positive    Review of Systems: as mentioned in the history of present illness.  All other systems reviewed and are negative.  Past Medical History:  Diagnosis Date  . Back pain   . Chicken pox   . Diabetes mellitus without complication ([place])   . DJD (degenerative joint disease)   . Migraines   . PONV (postoperative nausea and vomiting)    usually has 1 episode of vomiting within 1 hour of anesthesia    Past Surgical History:  Procedure Laterality Date  . BACK SURGERY    . CHOLECYSTECTOMY    . INSERTION OF MESH N/A 02/12/2015   Procedure: INSERTION OF MESH;  Surgeon: Sherri Rad, MD;  Location: [facility] ORS;  Service: General;  Laterality: N/A;  .  VENTRAL HERNIA REPAIR N/A 02/12/2015   Procedure: HERNIA REPAIR VENTRAL ADULT;  Surgeon: Sherri Rad, MD;  Location: [facility] ORS;  Service: General;  Laterality: N/A;   Social History:  reports that she has been smoking cigarettes. She has been smoking about 0.00 packs per day. She has never used smokeless tobacco. She reports that she does not drink alcohol and does not use drugs.  Allergies  Allergen Reactions  . Bee Venom Anaphylaxis and Swelling  . Advil [Ibuprofen] Swelling  . Jardiance [Empagliflozin] Rash  . Nitroglycerin Other (See Comments)    Pt states "causes me to feel off and weird all over and pressure in hands"  . Prednisone Swelling     Family history reviewed and not pertinent Family History  Problem Relation Age of Onset  . Diabetes Mother   . Gallbladder disease Mother   . Heart disease Father   . Alcohol abuse Father   . Gallbladder disease Father   . Heart disease Paternal Grandmother   . Colon cancer Neg Hx      Prior to Admission medications   Medication Sig Start Date End Date Taking? Authorizing Provider  Insulin Glargine (BASAGLAR KWIKPEN) 100 UNIT/ML SOPN Inject 50 Units into the skin daily.  10/15/17   [provider]    Physical Exam: Vitals:   06/20/20 1641 06/20/20 2030 06/20/20 2100 06/20/20 2130  BP:  (!) 111/97 133/85 105/72  Pulse:  (!) 107 (!) 103 (!) 107  Resp:      Temp:      TempSrc:  SpO2:  97% 94% 95%  Weight: 90.7 kg     Height: _0  (1.702 m)       General: alert and oriented to time, place, and person. Appear in moderate distress, affect anxious Eyes: PERRLA, Conjunctiva normal ENT: Oral Mucosa Clear, dry Neck: no JVD, no Abnormal Mass Or lumps Cardiovascular: S1 and S2 Present, no Murmur, peripheral pulses symmetrical Respiratory: good respiratory effort, Bilateral Air entry equal, no signs of accessory muscle use, Clear to Auscultation, no Crackles, no wheezes Abdomen: Bowel Sound present, Soft and no  tenderness, no hernia Skin: no rashes  Extremities:  Pedal edema,  calf tenderness Neurologic: Chronic bilateral lower extremity weakness secondary to cauda equina and pyriform syndrome/nerve compression. Baseline wheelchair-bound. No new neurological complaints Normal upper extremities and CN grossly intact Gait not checked due to patient safety concerns  Data Reviewed: I have personally reviewed and interpreted labs, imaging as discussed below.  CBC: Recent Labs  Lab 06/20/20 1644  WBC 17.5*  HGB 17.9*  HCT 52.3*  MCV 85.5  PLT 086   Basic Metabolic Panel: Recent Labs  Lab 06/20/20 1644 06/20/20 1902  NA 121*  --   K 4.7  --   CL 89*  --   CO2 8*  --   GLUCOSE 802*  --   BUN 24*  --   CREATININE 1.26*  --   CALCIUM 9.1  --   MG  --  2.3  PHOS  --  4.1   GFR: Estimated Creatinine Clearance: 57.6 mL/min (A) (by C-G formula based on SCr of 1.26 mg/dL (H)). Liver Function Tests: Recent Labs  Lab 06/20/20 1644  AST 26  ALT 31  ALKPHOS 222*  BILITOT 2.8*  PROT 8.4*  ALBUMIN 4.2   Recent Labs  Lab 06/20/20 1644  LIPASE 64*   No results for input(s): AMMONIA in the last 168 hours. Coagulation Profile: No results for input(s): INR, PROTIME in the last 168 hours. Cardiac Enzymes: No results for input(s): CKTOTAL, CKMB, CKMBINDEX, TROPONINI in the last 168 hours. BNP (last 3 results) No results for input(s): PROBNP in the last 8760 hours. HbA1C: No results for input(s): HGBA1C in the last 72 hours. CBG: Recent Labs  Lab 06/20/20 1901 06/20/20 2010 06/20/20 2127 06/20/20 2205  GLUCAP >600* >600* 492* 376*   Lipid Profile: No results for input(s): CHOL, HDL, LDLCALC, TRIG, CHOLHDL, LDLDIRECT in the last 72 hours. Thyroid Function Tests: No results for input(s): TSH, T4TOTAL, FREET4, T3FREE, THYROIDAB in the last 72 hours. Anemia Panel: No results for input(s): VITAMINB12, FOLATE, FERRITIN, TIBC, IRON, RETICCTPCT in the last 72 hours. Urine  analysis:    Component Value Date/Time   COLORURINE YELLOW (A) 08/21/2018 2103   APPEARANCEUR CLEAR (A) 08/21/2018 2103   APPEARANCEUR Hazy 01/20/2015 2018   LABSPEC >1.046 (H) 08/21/2018 2103   LABSPEC 1.015 01/20/2015 2018   PHURINE 5.0 08/21/2018 2103   GLUCOSEU NEGATIVE 08/21/2018 2103   GLUCOSEU Negative 01/20/2015 2018   HGBUR NEGATIVE 08/21/2018 2103   BILIRUBINUR NEGATIVE 08/21/2018 2103   BILIRUBINUR Negative 01/20/2015 2018   KETONESUR 5 (A) 08/21/2018 2103   PROTEINUR NEGATIVE 08/21/2018 2103   NITRITE NEGATIVE 08/21/2018 2103   LEUKOCYTESUR NEGATIVE 08/21/2018 2103   LEUKOCYTESUR Negative 01/20/2015 2018    Radiological Exams on Admission:  I reviewed all nursing notes, pharmacy notes, vitals, pertinent old records.  Assessment/Plan  Principal Problem:   DKA (diabetic ketoacidoses) Active Problems:   Chronic low back pain   Depression   Muscle  spasms of both lower extremities   Lower extremity weakness    # Diabetic ketoacidosis, patient has IDDM T2 diagnosed 3 years ago as per her and she is frustrated because she developed diabetes secondary to steroids given due to her chronic back pain Patient ran out of insulin 3 weeks ago due to unable to afford, no insurance Started IV insulin infusion, DKA protocol Continue IV fluid for hydration Monitor electrolytes and replete Elevated alkaline phosphate and lipase level, trend levels Transition to long-acting insulin and sliding scale after closing of anion gap Diabetic counseling   # Leukocytosis most likely reactive Check CBC tomorrow a.m.  # AKI most likely due to dehydration, prerenal Continue IV fluid and monitor renal functions and urine output.  # Chronic back pain with lower extremity weakness, cauda equina syndrome Continue as needed medication for pain control Patient is wheelchair-bound at baseline May benefit from PT and OT eval   # Depression, currently patient is not on any  medication Consider psych consult please call tomorrow a.m.     Nutrition: Carb modified diet DVT Prophylaxis: Subcutaneous Lovenox  Advance goals of care discussion: Full code   Consults: Psych consult please call tomorrow a.m.  Family Communication: family was not present at bedside, at the time of interview.  Opportunity was given to ask question and all questions were answered satisfactorily.  Disposition: Admitted as inpatient, step-down Likely to be discharged home, in 2-3 days .  I have discussed plan of care as described above with RN and patient/family.  Severity of Illness: The appropriate patient status for this patient is INPATIENT. Inpatient status is judged to be reasonable and necessary in order to provide the required intensity of service to ensure the patient's safety. The patient's presenting symptoms, physical exam findings, and initial radiographic and laboratory data in the context of their chronic comorbidities is felt to place them at high risk for further clinical deterioration. Furthermore, it is not anticipated that the patient will be medically stable for discharge from the hospital within 2 midnights of admission. The following factors support the patient status of inpatient.   " The patient's presenting symptoms include DKA. " The chronic co-morbidities include chronic back pain, wheelchair-bound, depression and noncompliance.   * I certify that at the point of admission it is my clinical judgment that the patient will require inpatient hospital care spanning beyond 2 midnights from the point of admission due to high intensity of service, high risk for further deterioration and high frequency of surveillance required.*    Author: Val Riles, MD [facility] 06/20/2020 10:31 PM   To reach On-call, see care teams to locate the attending and reach out to them via [email or web address]. If 7PM-7AM, please contact night-coverage If you still have difficulty  reaching the attending provider, please page the [facility] (Director on Call) for [facility] on amion for assistance.

## 2020-06-21 ENCOUNTER — Telehealth: Payer: Self-pay | Admitting: Gerontology

## 2020-06-21 DIAGNOSIS — M62838 Other muscle spasm: Secondary | ICD-10-CM

## 2020-06-21 DIAGNOSIS — M545 Low back pain: Secondary | ICD-10-CM

## 2020-06-21 DIAGNOSIS — F321 Major depressive disorder, single episode, moderate: Secondary | ICD-10-CM

## 2020-06-21 DIAGNOSIS — G8929 Other chronic pain: Secondary | ICD-10-CM

## 2020-06-21 LAB — BASIC METABOLIC PANEL
Anion gap: 11 (ref 5–15)
Anion gap: 10 (ref 5–15)
Anion gap: 11 (ref 5–15)
Anion gap: 9 (ref 5–15)
BUN: 21 mg/dL — ABNORMAL HIGH (ref 6–20)
BUN: 18 mg/dL (ref 6–20)
BUN: 16 mg/dL (ref 6–20)
BUN: 15 mg/dL (ref 6–20)
CO2: 21 mmol/L — ABNORMAL LOW (ref 22–32)
CO2: 25 mmol/L (ref 22–32)
CO2: 21 mmol/L — ABNORMAL LOW (ref 22–32)
CO2: 21 mmol/L — ABNORMAL LOW (ref 22–32)
Calcium: 8.8 mg/dL — ABNORMAL LOW (ref 8.9–10.3)
Calcium: 8.6 mg/dL — ABNORMAL LOW (ref 8.9–10.3)
Calcium: 8.4 mg/dL — ABNORMAL LOW (ref 8.9–10.3)
Calcium: 7.9 mg/dL — ABNORMAL LOW (ref 8.9–10.3)
Chloride: 99 mmol/L (ref 98–111)
Chloride: 98 mmol/L (ref 98–111)
Chloride: 99 mmol/L (ref 98–111)
Chloride: 102 mmol/L (ref 98–111)
Creatinine, Ser: 0.86 mg/dL (ref 0.44–1.00)
Creatinine, Ser: 0.81 mg/dL (ref 0.44–1.00)
Creatinine, Ser: 0.72 mg/dL (ref 0.44–1.00)
Creatinine, Ser: 0.58 mg/dL (ref 0.44–1.00)
GFR calc Af Amer: >60 mL/min (ref >60)
GFR calc Af Amer: >60 mL/min (ref >60)
GFR calc Af Amer: >60 mL/min (ref >60)
GFR calc Af Amer: >60 mL/min (ref >60)
GFR calc non Af Amer: >60 mL/min (ref >60)
GFR calc non Af Amer: >60 mL/min (ref >60)
GFR calc non Af Amer: >60 mL/min (ref >60)
GFR calc non Af Amer: >60 mL/min (ref >60)
Glucose, Bld: 233 mg/dL — ABNORMAL HIGH (ref 70–99)
Glucose, Bld: 154 mg/dL — ABNORMAL HIGH (ref 70–99)
Glucose, Bld: 192 mg/dL — ABNORMAL HIGH (ref 70–99)
Glucose, Bld: 234 mg/dL — ABNORMAL HIGH (ref 70–99)
Potassium: 3.7 mmol/L (ref 3.5–5.1)
Potassium: 3 mmol/L — ABNORMAL LOW (ref 3.5–5.1)
Potassium: 3.5 mmol/L (ref 3.5–5.1)
Potassium: 2.8 mmol/L — ABNORMAL LOW (ref 3.5–5.1)
Sodium: 131 mmol/L — ABNORMAL LOW (ref 135–145)
Sodium: 133 mmol/L — ABNORMAL LOW (ref 135–145)
Sodium: 131 mmol/L — ABNORMAL LOW (ref 135–145)
Sodium: 132 mmol/L — ABNORMAL LOW (ref 135–145)

## 2020-06-21 LAB — CBC
HCT: 43.7 % (ref 36.0–46.0)
Hemoglobin: 15.3 g/dL — ABNORMAL HIGH (ref 12.0–15.0)
MCH: 29.3 pg (ref 26.0–34.0)
MCHC: 35 g/dL (ref 30.0–36.0)
MCV: 83.6 fL (ref 80.0–100.0)
Platelets: 288 10*3/uL (ref 150–400)
RBC: 5.23 MIL/uL — ABNORMAL HIGH (ref 3.87–5.11)
RDW: 12.6 % (ref 11.5–15.5)
WBC: 14.8 10*3/uL — ABNORMAL HIGH (ref 4.0–10.5)
nRBC: 0 % (ref 0.0–0.2)

## 2020-06-21 LAB — URINALYSIS, COMPLETE (UACMP) WITH MICROSCOPIC
Bilirubin Urine: NEGATIVE
Glucose, UA: >=500 mg/dL — AB
Hgb urine dipstick: NEGATIVE
Ketones, ur: 80 mg/dL — AB
Nitrite: NEGATIVE
Protein, ur: 30 mg/dL — AB
Specific Gravity, Urine: 1.025 (ref 1.005–1.030)
pH: 6 (ref 5.0–8.0)

## 2020-06-21 LAB — LIPASE, BLOOD: Lipase: 59 U/L — ABNORMAL HIGH (ref 11–51)

## 2020-06-21 LAB — GLUCOSE, CAPILLARY
Glucose-Capillary: 179 mg/dL — ABNORMAL HIGH (ref 70–99)
Glucose-Capillary: 179 mg/dL — ABNORMAL HIGH (ref 70–99)
Glucose-Capillary: 221 mg/dL — ABNORMAL HIGH (ref 70–99)
Glucose-Capillary: 229 mg/dL — ABNORMAL HIGH (ref 70–99)
Glucose-Capillary: 239 mg/dL — ABNORMAL HIGH (ref 70–99)
Glucose-Capillary: 247 mg/dL — ABNORMAL HIGH (ref 70–99)
Glucose-Capillary: 250 mg/dL — ABNORMAL HIGH (ref 70–99)
Glucose-Capillary: 250 mg/dL — ABNORMAL HIGH (ref 70–99)
Glucose-Capillary: 252 mg/dL — ABNORMAL HIGH (ref 70–99)
Glucose-Capillary: 257 mg/dL — ABNORMAL HIGH (ref 70–99)
Glucose-Capillary: 264 mg/dL — ABNORMAL HIGH (ref 70–99)
Glucose-Capillary: 267 mg/dL — ABNORMAL HIGH (ref 70–99)
Glucose-Capillary: 280 mg/dL — ABNORMAL HIGH (ref 70–99)
Glucose-Capillary: 281 mg/dL — ABNORMAL HIGH (ref 70–99)
Glucose-Capillary: 285 mg/dL — ABNORMAL HIGH (ref 70–99)
Glucose-Capillary: 393 mg/dL — ABNORMAL HIGH (ref 70–99)

## 2020-06-21 LAB — RESPIRATORY PANEL BY RT PCR (FLU A&B, COVID)
Influenza A by PCR: NEGATIVE
Influenza B by PCR: NEGATIVE
SARS Coronavirus 2 by RT PCR: NEGATIVE

## 2020-06-21 LAB — MAGNESIUM: Magnesium: 1.8 mg/dL (ref 1.7–2.4)

## 2020-06-21 LAB — BETA-HYDROXYBUTYRIC ACID
Beta-Hydroxybutyric Acid: 3.93 mmol/L — ABNORMAL HIGH (ref 0.05–0.27)
Beta-Hydroxybutyric Acid: 0.99 mmol/L — ABNORMAL HIGH (ref 0.05–0.27)
Beta-Hydroxybutyric Acid: 0.27 mmol/L (ref 0.05–0.27)

## 2020-06-21 LAB — HEPATIC FUNCTION PANEL
ALT: 23 U/L (ref 0–44)
AST: 29 U/L (ref 15–41)
Albumin: 3.5 g/dL (ref 3.5–5.0)
Alkaline Phosphatase: 164 U/L — ABNORMAL HIGH (ref 38–126)
Bilirubin, Direct: 0.1 mg/dL (ref 0.0–0.2)
Indirect Bilirubin: 0.8 mg/dL (ref 0.3–0.9)
Total Bilirubin: 0.9 mg/dL (ref 0.3–1.2)
Total Protein: 6.8 g/dL (ref 6.5–8.1)

## 2020-06-21 LAB — PHOSPHORUS: Phosphorus: 2 mg/dL — ABNORMAL LOW (ref 2.5–4.6)

## 2020-06-21 MED ORDER — INSULIN GLARGINE 100 UNIT/ML ~~LOC~~ SOLN
28.0000 [IU] | SUBCUTANEOUS | Status: AC
  Administered 2020-06-21: 28 [IU] via SUBCUTANEOUS
  Filled 2020-06-21 (×2): qty 0.28

## 2020-06-21 MED ORDER — INSULIN REGULAR HUMAN 100 UNIT/ML IJ SOLN
4.0000 [IU] | Freq: Three times a day (TID) | INTRAMUSCULAR | Status: DC
  Administered 2020-06-21: 4 [IU] via SUBCUTANEOUS
  Filled 2020-06-21: qty 10

## 2020-06-21 MED ORDER — K PHOS MONO-SOD PHOS DI & MONO 155-852-130 MG PO TABS
250.0000 mg | ORAL_TABLET | Freq: Three times a day (TID) | ORAL | Status: DC
  Administered 2020-06-21 (×2): 250 mg via ORAL
  Filled 2020-06-21 (×4): qty 1

## 2020-06-21 MED ORDER — DEXTROSE-NACL 5-0.9 % IV SOLN: INTRAVENOUS | Status: DC

## 2020-06-21 MED ORDER — SODIUM CHLORIDE 0.9 % IV SOLN: INTRAVENOUS | Status: DC

## 2020-06-21 MED ORDER — POTASSIUM CHLORIDE CRYS ER 20 MEQ PO TBCR
40.0000 meq | EXTENDED_RELEASE_TABLET | Freq: Once | ORAL | Status: AC
  Administered 2020-06-21: 40 meq via ORAL
  Filled 2020-06-21: qty 2

## 2020-06-21 MED ORDER — INSULIN GLARGINE 100 UNIT/ML ~~LOC~~ SOLN: 28.0000 [IU] | Freq: Every day | SUBCUTANEOUS | Status: DC

## 2020-06-21 MED ORDER — INSULIN ASPART 100 UNIT/ML ~~LOC~~ SOLN
0.0000 [IU] | Freq: Three times a day (TID) | SUBCUTANEOUS | Status: DC
  Administered 2020-06-21: 7 [IU] via SUBCUTANEOUS
  Filled 2020-06-21: qty 1

## 2020-06-21 MED ORDER — POTASSIUM CHLORIDE CRYS ER 20 MEQ PO TBCR
20.0000 meq | EXTENDED_RELEASE_TABLET | Freq: Once | ORAL | Status: AC
  Administered 2020-06-21: 20 meq via ORAL
  Filled 2020-06-21: qty 1

## 2020-06-21 MED ORDER — INSULIN ASPART 100 UNIT/ML ~~LOC~~ SOLN: 0.0000 [IU] | Freq: Three times a day (TID) | SUBCUTANEOUS | Status: DC

## 2020-06-21 MED ORDER — INSULIN GLARGINE 100 UNIT/ML ~~LOC~~ SOLN
40.0000 [IU] | Freq: Every day | SUBCUTANEOUS | Status: DC
  Filled 2020-06-21: qty 0.4

## 2020-06-21 MED ORDER — INSULIN GLARGINE 100 UNIT/ML ~~LOC~~ SOLN
12.0000 [IU] | Freq: Every day | SUBCUTANEOUS | Status: DC
  Administered 2020-06-21: 12 [IU] via SUBCUTANEOUS
  Filled 2020-06-21 (×3): qty 0.12

## 2020-06-21 MED ORDER — ONDANSETRON HCL 4 MG/2ML IJ SOLN
4.0000 mg | Freq: Four times a day (QID) | INTRAMUSCULAR | Status: DC | PRN
  Administered 2020-06-21: 4 mg via INTRAVENOUS
  Filled 2020-06-21: qty 2

## 2020-06-21 NOTE — ED Notes (Signed)
Pt given phone to be screened for MRI 

## 2020-06-21 NOTE — Progress Notes (Signed)
Pt is verbally upset and demands to leave pt states that she is ready to go home and does not want to stay and would like IV's removed. This RN and charge nurse Dawn spoke with patient and no change. IV removed and NP Manuela Schwartz notified. AMA paper signed and placed in chart.

## 2020-06-21 NOTE — Telephone Encounter (Signed)
LVM about becoming a new pt @2 :40 on 06/21/20. -KW

## 2020-06-21 NOTE — Progress Notes (Signed)
1       PROGRESS NOTE    Laura Small  ASN:[id number] DOB: May 17, 1961 DOA: 06/20/2020 PCP: Merryl Hacker, No   Brief Narrative:  Laura Small is a 59 y.o. female with Past medical history of IDDM T2, hypertension, chronic back pain, pyriform syndrome/nerve compression, cauda equina with fecal and urinary incontinence, wheelchair-bound, presented at [facility] ED due to feeling weak and dizzy. Patient stated that she is hungry feels tired and thirsty. Patient ran out of insulin for past 3 weeks and she said that she could not afford due to financial issues and has no insurance. Patient has chronic backache and requesting strong pain medications especially opiates.   ED Course: Patient was found to have tachycardia, hyperglycemia blood glucose 800, sodium 121 pseudohyponatremia, acidosis bicarbonate 8, anion gap 24, AKI creatinine 1.26, elevated alk phos and lipase. Leukocytosis most likely reactive, beta hydroxybutyrate acid positive.  Assessment & Plan:   Principal Problem:   DKA (diabetic ketoacidoses) Active Problems:   Chronic low back pain   Depression   Muscle spasms of both lower extremities   Lower extremity weakness  DKA Present on admission patient has IDDM T2 diagnosed 3 years ago as per her and she is frustrated because she developed diabetes secondary to steroids given due to her chronic back pain Patient ran out of insulin 3 weeks ago due to unable to afford, no insurance -On DKA protocol -transition off to subcu insulin once anion gap close.  Start insulin Lantus 40 units daily Continue IV fluid for hydration Monitor electrolytes and replete Elevated alkaline phosphate and lipase level, trend levels Transition to long-acting insulin and sliding scale after closing of anion gap -Appreciate diabetic nurse input  # Leukocytosis most likely reactive Check CBC tomorrow a.m.  # AKI most likely due to dehydration, prerenal Continue IV fluid and monitor renal  functions and urine output.  # Chronic back pain with lower extremity weakness, cauda equina syndrome Continue as needed medication for pain control Patient is wheelchair-bound at baseline May benefit from PT and OT eval  # Depression, currently patient is not on any medication  Obesity Body mass index is 31.32 kg/m.     DVT prophylaxis: enoxaparin (LOVENOX) injection 40 mg Start: 06/20/20 2200     Code Status: Full Code Family Communication:Discussed with patient Disposition Plan: Next 1 to 2 days depending on blood sugar control and clinical condition Status is: Inpatient  Remains inpatient appropriate because:IV treatments appropriate due to intensity of illness or inability to take PO   Dispo: The patient is from: Home              Anticipated d/c is to: Home              Anticipated d/c date is: 2 days              Patient currently is not medically stable to d/c.    Subjective: Overall bitter about healthcare including her primary care and care in the ED. feels she is being undermined when providers make decisions about her.  Agreeable to get help with her insulin at discharge.  Denies any other symptoms at this time.  Wants to eat  Objective: Vitals:   06/21/20 1230 06/21/20 1300 06/21/20 1400 06/21/20 1500  BP: (!) 144/65 (!) 118/51 (!) 110/58 131/67  Pulse: 75 76 78 84  Resp: $Remo'16 20 18 16  'YhUTz$ Temp:      TempSrc:      SpO2: 96% 95% 96% 97%  Weight:      Height:        Intake/Output Summary (Last 24 hours) at 06/21/2020 1616 Last data filed at 06/21/2020 0757 Gross per 24 hour  Intake --  Output 600 ml  Net -600 ml   Filed Weights   06/20/20 1641  Weight: 90.7 kg    Examination:  General exam: Appears calm and comfortable  Respiratory system: Clear to auscultation. Respiratory effort normal. Cardiovascular system: S1 & S2 heard, RRR. No JVD, murmurs, rubs, gallops or clicks. No pedal edema. Gastrointestinal system: Abdomen is nondistended, soft  and nontender. No organomegaly or masses felt. Normal bowel sounds heard. Central nervous system: Alert and oriented. No focal neurological deficits. Extremities: Symmetric 5 x 5 power. Skin: No rashes, lesions or ulcers Psychiatry: Judgement and insight appear normal. Mood & affect appropriate.     Data Reviewed: I have personally reviewed following labs and imaging studies  CBC: Recent Labs  Lab 06/20/20 1644 06/21/20 0447  WBC 17.5* 14.8*  HGB 17.9* 15.3*  HCT 52.3* 43.7  MCV 85.5 83.6  PLT 379 160   Basic Metabolic Panel: Recent Labs  Lab 06/20/20 1644 06/20/20 1902 06/20/20 2320 06/21/20 0100 06/21/20 0447 06/21/20 0749 06/21/20 1323  NA   < >  --  127* 131* 133* 131* 132*  K   < >  --  3.9 3.7 3.0* 3.5 2.8*  CL   < >  --  98 99 98 99 102  CO2   < >  --  13* 21* 25 21* 21*  GLUCOSE   < >  --  319* 233* 154* 192* 234*  BUN   < >  --  23* 21* $Remov'18 16 15  'dpYRmG$ CREATININE   < >  --  0.84 0.86 0.81 0.72 0.58  CALCIUM   < >  --  9.1 8.8* 8.6* 8.4* 7.9*  MG  --  2.3  --   --  1.8  --   --   PHOS  --  4.1  --   --  2.0*  --   --    < > = values in this interval not displayed.   GFR: Estimated Creatinine Clearance: 90.7 mL/min (by C-G formula based on SCr of 0.58 mg/dL). Liver Function Tests: Recent Labs  Lab 06/20/20 1644 06/21/20 0447  AST 26 29  ALT 31 23  ALKPHOS 222* 164*  BILITOT 2.8* 0.9  PROT 8.4* 6.8  ALBUMIN 4.2 3.5   Recent Labs  Lab 06/20/20 1644 06/21/20 0447  LIPASE 64* 59*   No results for input(s): AMMONIA in the last 168 hours. Coagulation Profile: No results for input(s): INR, PROTIME in the last 168 hours. Cardiac Enzymes: No results for input(s): CKTOTAL, CKMB, CKMBINDEX, TROPONINI in the last 168 hours. BNP (last 3 results) No results for input(s): PROBNP in the last 8760 hours. HbA1C: No results for input(s): HGBA1C in the last 72 hours. CBG: Recent Labs  Lab 06/21/20 1053 06/21/20 1201 06/21/20 1319 06/21/20 1426 06/21/20 1541   GLUCAP 393* 280* 250* 281* 229*   Lipid Profile: No results for input(s): CHOL, HDL, LDLCALC, TRIG, CHOLHDL, LDLDIRECT in the last 72 hours. Thyroid Function Tests: No results for input(s): TSH, T4TOTAL, FREET4, T3FREE, THYROIDAB in the last 72 hours. Anemia Panel: No results for input(s): VITAMINB12, FOLATE, FERRITIN, TIBC, IRON, RETICCTPCT in the last 72 hours. Sepsis Labs: No results for input(s): PROCALCITON, LATICACIDVEN in the last 168 hours.  Recent Results (from the past 240 hour(s))  Respiratory Panel by RT PCR (Flu A&B, Covid) - Nasopharyngeal Swab     Status: None   Collection Time: 06/20/20  8:11 PM   Specimen: Nasopharyngeal Swab  Result Value Ref Range Status   SARS Coronavirus 2 by RT PCR NEGATIVE NEGATIVE Final    Comment: (NOTE) SARS-CoV-2 target nucleic acids are NOT DETECTED.  The SARS-CoV-2 RNA is generally detectable in upper respiratoy specimens during the acute phase of infection. The lowest concentration of SARS-CoV-2 viral copies this assay can detect is 131 copies/mL. A negative result does not preclude SARS-Cov-2 infection and should not be used as the sole basis for treatment or other patient management decisions. A negative result may occur with  improper specimen collection/handling, submission of specimen other than nasopharyngeal swab, presence of viral mutation(s) within the areas targeted by this assay, and inadequate number of viral copies (<131 copies/mL). A negative result must be combined with clinical observations, patient history, and epidemiological information. The expected result is Negative.  Fact Sheet for Patients:  PinkCheek.be  Fact Sheet for Healthcare Providers:  GravelBags.it  This test is no t yet approved or cleared by the [place] FDA and  has been authorized for detection and/or diagnosis of SARS-CoV-2 by FDA under an Emergency Use Authorization (EUA). This  EUA will remain  in effect (meaning this test can be used) for the duration of the COVID-19 declaration under Section 564(b)(1) of the Act, 21 U.S.C. section 360bbb-3(b)(1), unless the authorization is terminated or revoked sooner.     Influenza A by PCR NEGATIVE NEGATIVE Final   Influenza B by PCR NEGATIVE NEGATIVE Final    Comment: (NOTE) The Xpert Xpress SARS-CoV-2/FLU/RSV assay is intended as an aid in  the diagnosis of influenza from Nasopharyngeal swab specimens and  should not be used as a sole basis for treatment. Nasal washings and  aspirates are unacceptable for Xpert Xpress SARS-CoV-2/FLU/RSV  testing.  Fact Sheet for Patients: PinkCheek.be  Fact Sheet for Healthcare Providers: GravelBags.it  This test is not yet approved or cleared by the [place] FDA and  has been authorized for detection and/or diagnosis of SARS-CoV-2 by  FDA under an Emergency Use Authorization (EUA). This EUA will remain  in effect (meaning this test can be used) for the duration of the  Covid-19 declaration under Section 564(b)(1) of the Act, 21  U.S.C. section 360bbb-3(b)(1), unless the authorization is  terminated or revoked. Performed at [facility], [address] [place], [place] [address]          Radiology Studies: No results found.      Scheduled Meds: . enoxaparin (LOVENOX) injection  40 mg Subcutaneous Q24H  . insulin aspart  0-20 Units Subcutaneous TID AC & HS  . insulin glargine  28 Units Subcutaneous STAT  . [START ON 06/22/2020] insulin glargine  40 Units Subcutaneous Daily  . insulin regular  4 Units Subcutaneous TID AC  . phosphorus  250 mg Oral TID  . potassium chloride  40 mEq Oral Once   Continuous Infusions: . sodium chloride 125 mL/hr at 06/21/20 1007  . dextrose 5 % and 0.9% NaCl 75 mL/hr at 06/21/20 1133  . insulin 8.5 Units/hr (06/21/20 1133)     LOS: 1 day    Time spent: 35  minutes    Tysheka Fanguy Manuella Ghazi, MD [facility] Pager [phone]  If 7PM-7AM, please contact night-coverage [email or web address] Password [email or web address] 06/21/2020, 4:16 PM

## 2020-06-21 NOTE — ED Notes (Signed)
Report to Zella Ball, Charity fundraiser. Spoke with diabetes coordinator regarding patient's rising blood sugar and messaged Dr. Sherryll Burger about updating orders for fluids.

## 2020-06-21 NOTE — Progress Notes (Signed)
Cross Cover Note Notified by RN patients desire to leave AMA. Patient signed paper and left before I could discuss her decision with her. Per nurse patient was completely alert and oriented with judgement intact and that she understood by leaving she would not be able to get meds tonight or have follow up care in place.

## 2020-06-21 NOTE — ED Notes (Signed)
Pt ate 100% of breakfast tray.

## 2020-06-21 NOTE — ED Notes (Signed)
Pt reported getting up to bathroom by self. Pt educated to call nurse next time she uses the bathroom so that a urine sample may be obtained for a UA.  Pt states understanding.

## 2020-06-21 NOTE — TOC Initial Note (Addendum)
Transition of Care [facility]) - Initial/Assessment Note    Patient Details  Name: Laura Small MRN: [id number] Date of Birth: Aug 30, 1961  Transition of Care [facility] CM/SW Contact:    Marina Goodell Phone Number: [phone] 06/21/2020, 12:49 PM  Clinical Narrative:                  Patient presents to [facility] ED states "I haven't eaten in a week, the thought of food repulses me." Patient lives at home but is separated from spouse.  Patient has a history of non-compliance. CSW explained to patient about TOC and my role in patient care.  Patient began to complain about past PCPs and Dr. Lucianne Muss. Patient stated she has been denied for Medicaid and Disability several times. Patient sated she did not like Dr. 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 CSW told the patient I would send a referral to [facility] and give the importance of answering the phone when the clinic calls.  Patient verbalized understanding.  CSW asked the patient if she had any questions or needs, and patient state "no".  1:06PM:  Patient referral emailed to [facility].  Expected Discharge Plan: Home/Self Care Barriers to Discharge: Continued Medical Work up, ED Medication assistance, ED Uninsured needing PCP establishment, ED Uninsured needing medication assistance   Patient Goals and CMS Choice Patient states their goals for this hospitalization and ongoing recovery are:: "I have been lied to by my doctor's and it feels like noone is listening to me."      Expected Discharge Plan and Services Expected Discharge Plan: Home/Self Care In-house Referral: Clinical Social Work     Living arrangements for the past 2 months: Single Family Home                                       Prior Living Arrangements/Services Living arrangements for the past 2 months: Single Family Home Lives with:: Roommate Patient language and need for interpreter reviewed:: Yes Do you feel safe going back to the place where you live?: Yes      Need for Family Participation in Patient Care: No (Comment) Care giver support system in place?: No (comment)   Criminal Activity/Legal Involvement Pertinent to Current Situation/Hospitalization: No - Comment as needed  Activities of Daily Living      Permission Sought/Granted         Permission granted to share info w AGENCY: [facility]        Emotional Assessment Appearance:: Appears older than stated age Attitude/Demeanor/Rapport: Complaining Affect (typically observed): Frustrated, Irritable Orientation: : Oriented to Self, Oriented to Place, Oriented to  Time, Oriented to Situation Alcohol / Substance Use: Not Applicable Psych Involvement: No (comment)  Admission diagnosis:  DKA (diabetic ketoacidoses) [E11.10] Patient Active Problem List   Diagnosis Date Noted  . DKA (diabetic ketoacidoses) 06/20/2020  . Elevated LFTs 06/16/2018  . Lower extremity weakness 06/10/2018  . Elevated BP without diagnosis of hypertension 04/14/2018  . Depression 10/12/2017  . Muscle spasms of both lower extremities 10/12/2017  . Aortic atherosclerosis (HCC) 01/08/2017  . Tobacco abuse 01/08/2017  . Ileitis 01/08/2017  . DM (diabetes mellitus), type 2 (HCC)  09/23/2016  . Hyperlipidemia 09/23/2016  . Chronic low back pain 09/08/2016   PCP:  Pcp, No Pharmacy:   [facility] [place], [place] - [address] [place] [address] Phone: [phone] Fax: [phone]  [facility] [place], [place] - [address] [place] [address] Phone: [phone] Fax: [phone]     Social Determinants of Health (SDOH) Interventions    Readmission Risk  Interventions No flowsheet data found.

## 2020-06-21 NOTE — Progress Notes (Signed)
Inpatient Diabetes Program Recommendations  AACE/ADA: New Consensus Statement on Inpatient Glycemic Control (2015)  Target Ranges:  Prepandial:   less than 140 mg/dL      Peak postprandial:   less than 180 mg/dL (1-2 hours)      Critically ill patients:  140 - 180 mg/dL   Lab Results  Component Value Date   GLUCAP 179 (H) 06/21/2020   HGBA1C 9.5 (H) 11/19/2018    Review of Glycemic Control  Diabetes history: DM2 Outpatient Diabetes medications: Basaglar uncertain if 40 or 50 units qd (not taken past 3 weeks due to running out) Current orders for Inpatient glycemic control: Lantus 12 units + Novolog resistant correction qid  Inpatient Diabetes Program Recommendations:    A1c in process  Transition of Care already in place regarding medication assistance. Per chart, patient ran out of insulin and has not taken x 3 weeks. Discussed with Dr. Sherryll Burger per secure chat. Consider: -Increase in Lantus to 40 units daily -Add Novolog 4 units tid if needed for meal coverage  If patient can afford Novolin Relion 70/30 insulin from [employer] if other medication assistance programs not available: 70/30 30 units bid ac meals would equal approximately 42 units basal + 18 units meal coverage. $42 Novolin relion pens would last approximately 25 days if continues on above dose. Will follow and assist as needed.  Thank you, Laura Small. Noeh Sparacino, RN, MSN, CDE  Diabetes Coordinator Inpatient Glycemic Control Team Team Pager [phone] (8am-5pm) 06/21/2020 9:52 AM

## 2020-06-21 NOTE — ED Notes (Signed)
Pt eating breakfast at this time.  

## 2020-06-22 LAB — HEMOGLOBIN A1C
Hgb A1c MFr Bld: >15.5 % — ABNORMAL HIGH (ref 4.8–5.6)
Mean Plasma Glucose: >398 mg/dL

## 2020-06-25 MED FILL — Insulin Regular (Human) Inj 100 Unit/ML: INTRAMUSCULAR | Qty: 0.04 | Status: AC

## 2020-06-25 NOTE — Discharge Summary (Signed)
5        [place] at [facility]   PATIENT NAME: Laura Small    MR#:  [id number]  DATE OF BIRTH:  1961-02-24  DATE OF ADMISSION:  06/20/2020   ADMITTING PHYSICIAN: Gillis Santa, MD  DATE OF DISCHARGE: 06/21/2020 10:01 PM  PRIMARY CARE PHYSICIAN: Pcp, No   ADMISSION DIAGNOSIS:  DKA (diabetic ketoacidoses) [E11.10] Diabetic ketoacidosis without coma associated with type 2 diabetes mellitus (HCC) [E11.10] DISCHARGE DIAGNOSIS:  Principal Problem:   DKA (diabetic ketoacidoses) Active Problems:   Chronic low back pain   Depression   Muscle spasms of both lower extremities   Lower extremity weakness  SECONDARY DIAGNOSIS:   Past Medical History:  Diagnosis Date   Back pain    Chicken pox    Diabetes mellitus without complication (HCC)    DJD (degenerative joint disease)    Migraines    PONV (postoperative nausea and vomiting)    usually has 1 episode of vomiting within 1 hour of anesthesia    HOSPITAL COURSE:  Patient was admitted for DKA but decided to leave AMA.  She was very upset and negative about her primary care, other healthcare team and her stay in the ED throughout the time when I saw her.  DISCHARGE CONDITIONS:  Stable CONSULTS OBTAINED:   DRUG ALLERGIES:   Allergies  Allergen Reactions   Bee Venom Anaphylaxis and Swelling   Advil [Ibuprofen] Swelling   Jardiance [Empagliflozin] Rash   Nitroglycerin Other (See Comments)    Pt states "causes me to feel off and weird all over and pressure in hands"   Prednisone Swelling   DISCHARGE MEDICATIONS:   Allergies as of 06/21/2020      Reactions   Bee Venom Anaphylaxis, Swelling   Advil [ibuprofen] Swelling   Jardiance [empagliflozin] Rash   Nitroglycerin Other (See Comments)   Pt states "causes me to feel off and weird all over and pressure in hands"   Prednisone Swelling      Medication List    ASK your doctor about these medications   aspirin 81 MG chewable tablet Chew by  mouth daily.   b complex vitamins capsule Take 1 capsule by mouth daily.   Basaglar KwikPen 100 UNIT/ML Inject 40 Units into the skin daily.   magnesium oxide 400 MG tablet Commonly known as: MAG-OX Take 400 mg by mouth daily.   Potassium 99 MG Tabs Take 1 tablet by mouth daily.      DISCHARGE INSTRUCTIONS:   DIET:  Diabetic diet DISCHARGE CONDITION:  Stable ACTIVITY:  Activity as tolerated OXYGEN:  Home Oxygen: No.  Oxygen Delivery: room air DISCHARGE LOCATION:  home   If you experience worsening of your admission symptoms, develop shortness of breath, life threatening emergency, suicidal or homicidal thoughts you must seek medical attention immediately by calling 911 or calling your MD immediately  if symptoms less severe.  You Must read complete instructions/literature along with all the possible adverse reactions/side effects for all the Medicines you take and that have been prescribed to you. Take any new Medicines after you have completely understood and accpet all the possible adverse reactions/side effects.   Please note  You were cared for by a hospitalist during your hospital stay. If you have any questions about your discharge medications or the care you received while you were in the hospital after you are discharged, you can call the unit and asked to speak with the hospitalist on call if the hospitalist that took care of  you is not available. Once you are discharged, your primary care physician will handle any further medical issues. Please note that NO REFILLS for any discharge medications will be authorized once you are discharged, as it is imperative that you return to your primary care physician (or establish a relationship with a primary care physician if you do not have one) for your aftercare needs so that they can reassess your need for medications and monitor your lab values.    On the day of Discharge:  VITAL SIGNS:  Blood pressure (!) 107/59, pulse  83, temperature 97.8 F (36.6 C), temperature source Oral, resp. rate 20, height 5\' 7"  (1.702 m), weight 90.7 kg, SpO2 100 %. PHYSICAL EXAMINATION:  GENERAL:  58 y.o.-year-old patient lying in the bed with no acute distress.  EYES: Pupils equal, round, reactive to light and accommodation. No scleral icterus. Extraocular muscles intact.  HEENT: Head atraumatic, normocephalic. Oropharynx and nasopharynx clear.  NECK:  Supple, no jugular venous distention. No thyroid enlargement, no tenderness.  LUNGS: Normal breath sounds bilaterally, no wheezing, rales,rhonchi or crepitation. No use of accessory muscles of respiration.  CARDIOVASCULAR: S1, S2 normal. No murmurs, rubs, or gallops.  ABDOMEN: Soft, non-tender, non-distended. Bowel sounds present. No organomegaly or mass.  EXTREMITIES: No pedal edema, cyanosis, or clubbing.  NEUROLOGIC: Cranial nerves II through XII are intact. Muscle strength 5/5 in all extremities. Sensation intact. Gait not checked.  PSYCHIATRIC: The patient is alert and oriented x 3.  SKIN: No obvious rash, lesion, or ulcer.  DATA REVIEW:   CBC Recent Labs  Lab 06/21/20 0447  WBC 14.8*  HGB 15.3*  HCT 43.7  PLT 288    Chemistries  Recent Labs  Lab 06/21/20 0447 06/21/20 0749 06/21/20 1323  NA 133*   < > 132*  K 3.0*   < > 2.8*  CL 98   < > 102  CO2 25   < > 21*  GLUCOSE 154*   < > 234*  BUN 18   < > 15  CREATININE 0.81   < > 0.58  CALCIUM 8.6*   < > 7.9*  MG 1.8  --   --   AST 29  --   --   ALT 23  --   --   ALKPHOS 164*  --   --   BILITOT 0.9  --   --    < > = values in this interval not displayed.     Outpatient follow-up    Management plans discussed with the patient, family and they are in agreement.  CODE STATUS: Prior   TOTAL TIME TAKING CARE OF THIS PATIENT: 45 minutes.    06/23/20 M.D on 06/25/2020 at 6:18 AM  [facility]   CC: Primary care physician; Pcp, No   Note: This dictation was prepared with Dragon dictation  along with smaller phrase technology. Any transcriptional errors that result from this process are unintentional.

## 2020-07-10 ENCOUNTER — Other Ambulatory Visit: Payer: Self-pay

## 2020-07-10 ENCOUNTER — Ambulatory Visit: Payer: Self-pay | Admitting: Gerontology

## 2020-07-10 ENCOUNTER — Encounter: Payer: Self-pay | Admitting: Gerontology

## 2020-07-10 VITALS — BP 128/75 | HR 88 | Resp 12

## 2020-07-10 DIAGNOSIS — G8929 Other chronic pain: Secondary | ICD-10-CM

## 2020-07-10 DIAGNOSIS — E1165 Type 2 diabetes mellitus with hyperglycemia: Secondary | ICD-10-CM

## 2020-07-10 DIAGNOSIS — M545 Low back pain, unspecified: Secondary | ICD-10-CM

## 2020-07-10 DIAGNOSIS — Z794 Long term (current) use of insulin: Secondary | ICD-10-CM

## 2020-07-10 DIAGNOSIS — Z7689 Persons encountering health services in other specified circumstances: Secondary | ICD-10-CM

## 2020-07-10 MED ORDER — INSULIN LISPRO 100 UNIT/ML ~~LOC~~ SOLN: 2.0000 [IU] | Freq: Three times a day (TID) | SUBCUTANEOUS | 11 refills | Status: DC

## 2020-07-10 MED ORDER — BLOOD GLUCOSE MONITOR KIT: PACK | 0 refills | Status: DC

## 2020-07-10 MED ORDER — BASAGLAR KWIKPEN 100 UNIT/ML ~~LOC~~ SOPN: 40.0000 [IU] | PEN_INJECTOR | Freq: Every day | SUBCUTANEOUS | 5 refills | Status: DC

## 2020-07-10 MED ORDER — INSULIN LISPRO 100 UNIT/ML ~~LOC~~ SOLN: 3.0000 [IU] | Freq: Three times a day (TID) | SUBCUTANEOUS | 11 refills | Status: DC

## 2020-07-10 NOTE — Patient Instructions (Signed)
Carbohydrate Counting for Diabetes Mellitus, Adult  Carbohydrate counting is a method of keeping track of how many carbohydrates you eat. Eating carbohydrates naturally increases the amount of sugar (glucose) in the blood. Counting how many carbohydrates you eat helps keep your blood glucose within normal limits, which helps you manage your diabetes (diabetes mellitus). It is important to know how many carbohydrates you can safely have in each meal. This is different for every person. A diet and nutrition specialist (registered dietitian) can help you make a meal plan and calculate how many carbohydrates you should have at each meal and snack. Carbohydrates are found in the following foods:  Grains, such as breads and cereals.  Dried beans and soy products.  Starchy vegetables, such as potatoes, peas, and corn.  Fruit and fruit juices.  Milk and yogurt.  Sweets and snack foods, such as cake, cookies, candy, chips, and soft drinks. How do I count carbohydrates? There are two ways to count carbohydrates in food. You can use either of the methods or a combination of both. Reading "Nutrition Facts" on packaged food The "Nutrition Facts" list is included on the labels of almost all packaged foods and beverages in the [place] It includes:  The serving size.  Information about nutrients in each serving, including the grams (g) of carbohydrate per serving. To use the "Nutrition Facts":  Decide how many servings you will have.  Multiply the number of servings by the number of carbohydrates per serving.  The resulting number is the total amount of carbohydrates that you will be having. Learning standard serving sizes of other foods When you eat carbohydrate foods that are not packaged or do not include "Nutrition Facts" on the label, you need to measure the servings in order to count the amount of carbohydrates:  Measure the foods that you will eat with a food scale or measuring cup, if  needed.  Decide how many standard-size servings you will eat.  Multiply the number of servings by 15. Most carbohydrate-rich foods have about 15 g of carbohydrates per serving. ? For example, if you eat 8 oz (170 g) of strawberries, you will have eaten 2 servings and 30 g of carbohydrates (2 servings x 15 g = 30 g).  For foods that have more than one food mixed, such as soups and casseroles, you must count the carbohydrates in each food that is included. The following list contains standard serving sizes of common carbohydrate-rich foods. Each of these servings has about 15 g of carbohydrates:   hamburger bun or  English muffin.   oz (15 mL) syrup.   oz (14 g) jelly.  1 slice of bread.  1 six-inch tortilla.  3 oz (85 g) cooked rice or pasta.  4 oz (113 g) cooked dried beans.  4 oz (113 g) starchy vegetable, such as peas, corn, or potatoes.  4 oz (113 g) hot cereal.  4 oz (113 g) mashed potatoes or  of a large baked potato.  4 oz (113 g) canned or frozen fruit.  4 oz (120 mL) fruit juice.  4-6 crackers.  6 chicken nuggets.  6 oz (170 g) unsweetened dry cereal.  6 oz (170 g) plain fat-free yogurt or yogurt sweetened with artificial sweeteners.  8 oz (240 mL) milk.  8 oz (170 g) fresh fruit or one small piece of fruit.  24 oz (680 g) popped popcorn. Example of carbohydrate counting Sample meal  3 oz (85 g) chicken breast.  6 oz (170 g)   brown rice.  4 oz (113 g) corn.  8 oz (240 mL) milk.  8 oz (170 g) strawberries with sugar-free whipped topping. Carbohydrate calculation 1. Identify the foods that contain carbohydrates: ? Rice. ? Corn. ? Milk. ? Strawberries. 2. Calculate how many servings you have of each food: ? 2 servings rice. ? 1 serving corn. ? 1 serving milk. ? 1 serving strawberries. 3. Multiply each number of servings by 15 g: ? 2 servings rice x 15 g = 30 g. ? 1 serving corn x 15 g = 15 g. ? 1 serving milk x 15 g = 15 g. ? 1  serving strawberries x 15 g = 15 g. 4. Add together all of the amounts to find the total grams of carbohydrates eaten: ? 30 g + 15 g + 15 g + 15 g = 75 g of carbohydrates total. Summary  Carbohydrate counting is a method of keeping track of how many carbohydrates you eat.  Eating carbohydrates naturally increases the amount of sugar (glucose) in the blood.  Counting how many carbohydrates you eat helps keep your blood glucose within normal limits, which helps you manage your diabetes.  A diet and nutrition specialist (registered dietitian) can help you make a meal plan and calculate how many carbohydrates you should have at each meal and snack. This information is not intended to replace advice given to you by your health care provider. Make sure you discuss any questions you have with your health care provider. Document Revised: 04/02/2017 Document Reviewed: 02/20/2016 Elsevier Patient Education  2020 Elsevier Inc.  

## 2020-07-10 NOTE — Progress Notes (Signed)
Patient ID: Laura Small, female   DOB: 12/05/1960, 59 y.o.   MRN: [id number]  Chief Complaint  Patient presents with   Diabetes    pt reports having DKA having 9/26 through 9/28 at [facility].  Reports being at home and throwing up "tarry" "pellets" that were "dark" and "gelatinous"   Leg Pain    pt reports having piriformis syndrome.  Wants to be able to "walk again"   Financial Challenges    interested in help seeking disability.  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 female who presents to establish care and evaluation of her chronic condition. She was seen at the ED for DKA on 06/20/2020, her HgbA >15.5%. She reports that she checks her fasting blood glucose daily and her reading was 305 mg/dl. Her blood glucose was checked during visit and it was 317 mg/dl. She reports taking 40 units of Basaglar at bedtime. She denies hypoglycemia, occasional polyuria and performs daily foot checks.  She also states that she has non traumatic chronic back pain which has been going on since 2017. She states that her lower back pain is a constant 8/10, that radiates to bilateral lower leg, " feels that my hip is about to explode" and laughing, sneezing, coughing, hicupping, getting angry or stressed aggravtaes symptoms". She denies any releiving factor. She states that she's not allowed to take any Opoid, taking Gabapentin leaves her intoxicated and Tramadol erases her memory. She describes the pain as stabbing, sharp, shotting ice pick like and aching. She reports that after conducting series of research, she believes that she has "Piriformis syndrome" due to nerve decompression. she used to be a truck drive. She reports that she's incontinence of bowel and bladder, bilateral weakness to bilateral lower leg and she  reports that she has " Cauda Equina". she reports walking short distance with a cane and wheels herself in a wheel chair for long distance. She sat in her wheel chair during her clinic visit. She left the Hospital AMA on 06/21/2020. She states that she is in a "dark place" due to her lower back pain because she can't get any help. She denies suicidal nor homicidal ideation and declines Behavioral Health referral. She left the hospital Against Medical Advise  [facility]) on 06/21/2020. Overall, she states that she's doing well and offers no further complaint.   Past Medical History:  Diagnosis Date   Back pain    Chicken pox    Diabetes mellitus without complication (HCC)    DJD (degenerative joint disease)    Migraines    PONV (postoperative nausea and vomiting)    usually has 1 episode of vomiting within 1 hour of anesthesia     Past Surgical History:  Procedure Laterality Date   BACK SURGERY     CHOLECYSTECTOMY     INSERTION OF MESH N/A 02/12/2015   Procedure: INSERTION OF MESH;  Surgeon: Sherri Rad, MD;  Location: [facility] ORS;  Service: General;  Laterality: N/A;   VENTRAL HERNIA REPAIR N/A 02/12/2015   Procedure: HERNIA REPAIR VENTRAL ADULT;  Surgeon: Sherri Rad, MD;  Location: [facility] ORS;  Service: General;  Laterality: N/A;    Family History  Problem Relation Age of Onset   Diabetes Mother    Gallbladder disease Mother  Heart disease Father    Alcohol abuse Father    Gallbladder disease Father    Heart disease Paternal Grandmother    Colon cancer Neg Hx     Social History Social History   Tobacco Use   Smoking status: Current Every Day Smoker    Packs/day: 0.05    Types: Cigarettes   Smokeless tobacco: Never Used   Tobacco comment: pt reports being in the process of quitting and is down to 1 cigarette a day  Substance Use Topics   Alcohol use: No    Comment: reports alcohol abuse in teenage years   Drug use: No    Allergies  Allergen Reactions   Bee  Venom Anaphylaxis and Swelling   Advil [Ibuprofen] Swelling   Jardiance [Empagliflozin] Rash   Nitroglycerin Other (See Comments)    Pt states "causes me to feel off and weird all over and pressure in hands"   Gabapentin     Reports being "intoxicated": dizzy, "loss of memory," "could not walk in a straight line," "all the effects of alcohol"   Metformin And Related Diarrhea, Nausea And Vomiting and Swelling    Also loss of appetite   Tramadol     "while I was taking it, I have no memory of any of my classes"   Prednisone Swelling    Current Outpatient Medications  Medication Sig Dispense Refill   aspirin 81 MG chewable tablet Chew by mouth daily.     b complex vitamins capsule Take 1 capsule by mouth daily.     blood glucose meter kit and supplies KIT Dispense based on patient and insurance preference. Use up to four times daily as directed. (FOR ICD-9 250.00, 250.01). 1 each 0   Insulin Glargine (BASAGLAR KWIKPEN) 100 UNIT/ML Inject 40 Units into the skin daily. 3 mL 5   insulin lispro (HUMALOG) 100 UNIT/ML injection Inject 0.03 mLs (3 Units total) into the skin 3 (three) times daily before meals. 10 mL 11   magnesium oxide (MAG-OX) 400 MG tablet Take 400 mg by mouth daily.     Potassium 99 MG TABS Take 1 tablet by mouth daily.     No current facility-administered medications for this visit.    Review of Systems Review of Systems  Constitutional: Negative.   HENT: Negative.   Eyes: Negative.   Respiratory: Negative.   Cardiovascular: Negative.   Gastrointestinal: Negative.   Endocrine: Negative.   Musculoskeletal: Positive for back pain.  Neurological: Negative.   Hematological: Negative.   Psychiatric/Behavioral: Positive for dysphoric mood (States that her mood is dark because of her chronic back pain).    Blood pressure 128/75, pulse 88, resp. rate 12, SpO2 99 %.  Physical Exam Physical Exam HENT:     Head: Normocephalic and atraumatic.     Nose:      Comments: Deferred per Covid protocol    Mouth/Throat:     Comments: Deferred per Covid protocol Eyes:     Extraocular Movements: Extraocular movements intact.     Pupils: Pupils are equal, round, and reactive to light.  Cardiovascular:     Rate and Rhythm: Normal rate and regular rhythm.     Pulses: Normal pulses.     Heart sounds: Normal heart sounds.  Pulmonary:     Effort: Pulmonary effort is normal.     Breath sounds: Normal breath sounds.  Abdominal:     General: Abdomen is flat. Bowel sounds are normal.     Palpations: Abdomen is soft.  Genitourinary:    Comments: Deferred per Covid Protocol Musculoskeletal:        General: Tenderness (to back with palpation.) present.     Cervical back: Normal range of motion.  Skin:    General: Skin is warm and dry.  Neurological:     General: No focal deficit present.     Mental Status: She is alert and oriented to person, place, and time. Mental status is at baseline.     Sensory: No sensory deficit.     Motor: No weakness.     Comments: She declines walking or standing during visit.     Data Reviewed -Routine labs and past medical history was reviewed.  Assessment and Plan   1. Type 2 diabetes mellitus with hyperglycemia, with long-term current use of insulin (HCC) -Her HgbA1c was >15.5%, her goal should be less than 7%. She was advised to check blood glucose tid, record and bring log to follow up visit , and her fasting level should be between 80-130 mg/dl. She will continue on 40 units Basaglar and 3 units of Humalog insulin tid with meals. - Insulin Glargine (BASAGLAR KWIKPEN) 100 UNIT/ML; Inject 40 Units into the skin daily.  Dispense: 3 mL; Refill: 5 - blood glucose meter kit and supplies KIT; Dispense based on patient and insurance preference. Use up to four times daily as directed. (FOR ICD-9 250.00, 250.01).  Dispense: 1 each; Refill: 0 - insulin lispro (HUMALOG) 100 UNIT/ML injection; Inject 0.03 mLs (3 Units total)  into the skin 3 (three) times daily before meals.  Dispense: 10 mL; Refill: 11  2. Encounter to establish care - Some routine labs will be checked.    . Chronic low back pain, unspecified back pain laterality, unspecified whether sciatica present -During her clinic visit, she declines to walk, due to weakness to bilateral lower extremity and she sat in her wheel chair. Per patient, she experiences bladder and bowel incontinence and she states that she has "Cauda Equina". She was advised that Cauda Equina is a medical emergency and she was advised to go to the ED, she declines EMS to be called during visit. 45 minutes later she was observed at the Pharmacy walking, without limping from the building to her car without cane nor wheel chair. -She will follow up with [facility] Orthopedic Surgeon Dr Vickki Hearing .    Follow up: 07/25/2020 or if symptoms worsen or fail to improve. Laura Small 07/10/2020, 8:43 PM

## 2020-07-17 ENCOUNTER — Ambulatory Visit: Payer: Self-pay | Admitting: Pharmacy Technician

## 2020-07-17 ENCOUNTER — Other Ambulatory Visit: Payer: Self-pay

## 2020-07-17 DIAGNOSIS — Z79899 Other long term (current) drug therapy: Secondary | ICD-10-CM

## 2020-07-18 ENCOUNTER — Other Ambulatory Visit: Payer: Self-pay

## 2020-07-19 ENCOUNTER — Other Ambulatory Visit: Payer: Self-pay

## 2020-07-19 NOTE — Progress Notes (Signed)
Completed [facility] application and contract.  Patient agreed to all terms of the [facility] contract.    Patient approved to receive medication assistance at [facility] until time for re-certification in 2505, and as long as eligibility criteria continues to be met.    Provided patient with community resource material based on her particular needs.    [place] [facility]

## 2020-07-25 ENCOUNTER — Ambulatory Visit: Payer: Self-pay | Admitting: Gerontology

## 2020-07-26 ENCOUNTER — Other Ambulatory Visit: Payer: Self-pay

## 2020-07-26 ENCOUNTER — Encounter: Payer: Self-pay | Admitting: Gerontology

## 2020-07-26 ENCOUNTER — Ambulatory Visit: Payer: Self-pay | Admitting: Gerontology

## 2020-07-26 VITALS — BP 136/83 | HR 93 | Temp 98.0°F | Resp 14 | Wt 214.0 lb

## 2020-07-26 DIAGNOSIS — G8929 Other chronic pain: Secondary | ICD-10-CM

## 2020-07-26 DIAGNOSIS — E1165 Type 2 diabetes mellitus with hyperglycemia: Secondary | ICD-10-CM

## 2020-07-26 DIAGNOSIS — Z794 Long term (current) use of insulin: Secondary | ICD-10-CM

## 2020-07-26 DIAGNOSIS — Z0000 Encounter for general adult medical examination without abnormal findings: Secondary | ICD-10-CM

## 2020-07-26 DIAGNOSIS — M545 Low back pain, unspecified: Secondary | ICD-10-CM

## 2020-07-26 DIAGNOSIS — F419 Anxiety disorder, unspecified: Secondary | ICD-10-CM | POA: Insufficient documentation

## 2020-07-26 NOTE — Patient Instructions (Signed)
Carbohydrate Counting for Diabetes Mellitus, Adult  Carbohydrate counting is a method of keeping track of how many carbohydrates you eat. Eating carbohydrates naturally increases the amount of sugar (glucose) in the blood. Counting how many carbohydrates you eat helps keep your blood glucose within normal limits, which helps you manage your diabetes (diabetes mellitus). It is important to know how many carbohydrates you can safely have in each meal. This is different for every person. A diet and nutrition specialist (registered dietitian) can help you make a meal plan and calculate how many carbohydrates you should have at each meal and snack. Carbohydrates are found in the following foods:  Grains, such as breads and cereals.  Dried beans and soy products.  Starchy vegetables, such as potatoes, peas, and corn.  Fruit and fruit juices.  Milk and yogurt.  Sweets and snack foods, such as cake, cookies, candy, chips, and soft drinks. How do I count carbohydrates? There are two ways to count carbohydrates in food. You can use either of the methods or a combination of both. Reading "Nutrition Facts" on packaged food The "Nutrition Facts" list is included on the labels of almost all packaged foods and beverages in the [place] It includes:  The serving size.  Information about nutrients in each serving, including the grams (g) of carbohydrate per serving. To use the "Nutrition Facts":  Decide how many servings you will have.  Multiply the number of servings by the number of carbohydrates per serving.  The resulting number is the total amount of carbohydrates that you will be having. Learning standard serving sizes of other foods When you eat carbohydrate foods that are not packaged or do not include "Nutrition Facts" on the label, you need to measure the servings in order to count the amount of carbohydrates:  Measure the foods that you will eat with a food scale or measuring cup, if  needed.  Decide how many standard-size servings you will eat.  Multiply the number of servings by 15. Most carbohydrate-rich foods have about 15 g of carbohydrates per serving. ? For example, if you eat 8 oz (170 g) of strawberries, you will have eaten 2 servings and 30 g of carbohydrates (2 servings x 15 g = 30 g).  For foods that have more than one food mixed, such as soups and casseroles, you must count the carbohydrates in each food that is included. The following list contains standard serving sizes of common carbohydrate-rich foods. Each of these servings has about 15 g of carbohydrates:   hamburger bun or  English muffin.   oz (15 mL) syrup.   oz (14 g) jelly.  1 slice of bread.  1 six-inch tortilla.  3 oz (85 g) cooked rice or pasta.  4 oz (113 g) cooked dried beans.  4 oz (113 g) starchy vegetable, such as peas, corn, or potatoes.  4 oz (113 g) hot cereal.  4 oz (113 g) mashed potatoes or  of a large baked potato.  4 oz (113 g) canned or frozen fruit.  4 oz (120 mL) fruit juice.  4-6 crackers.  6 chicken nuggets.  6 oz (170 g) unsweetened dry cereal.  6 oz (170 g) plain fat-free yogurt or yogurt sweetened with artificial sweeteners.  8 oz (240 mL) milk.  8 oz (170 g) fresh fruit or one small piece of fruit.  24 oz (680 g) popped popcorn. Example of carbohydrate counting Sample meal  3 oz (85 g) chicken breast.  6 oz (170 g)   brown rice.  4 oz (113 g) corn.  8 oz (240 mL) milk.  8 oz (170 g) strawberries with sugar-free whipped topping. Carbohydrate calculation 1. Identify the foods that contain carbohydrates: ? Rice. ? Corn. ? Milk. ? Strawberries. 2. Calculate how many servings you have of each food: ? 2 servings rice. ? 1 serving corn. ? 1 serving milk. ? 1 serving strawberries. 3. Multiply each number of servings by 15 g: ? 2 servings rice x 15 g = 30 g. ? 1 serving corn x 15 g = 15 g. ? 1 serving milk x 15 g = 15 g. ? 1  serving strawberries x 15 g = 15 g. 4. Add together all of the amounts to find the total grams of carbohydrates eaten: ? 30 g + 15 g + 15 g + 15 g = 75 g of carbohydrates total. Summary  Carbohydrate counting is a method of keeping track of how many carbohydrates you eat.  Eating carbohydrates naturally increases the amount of sugar (glucose) in the blood.  Counting how many carbohydrates you eat helps keep your blood glucose within normal limits, which helps you manage your diabetes.  A diet and nutrition specialist (registered dietitian) can help you make a meal plan and calculate how many carbohydrates you should have at each meal and snack. This information is not intended to replace advice given to you by your health care provider. Make sure you discuss any questions you have with your health care provider. Document Revised: 04/02/2017 Document Reviewed: 02/20/2016 Elsevier Patient Education  2020 Elsevier Inc.  

## 2020-07-26 NOTE — Progress Notes (Signed)
Established Patient Office Visit  Subjective:  Patient ID: Laura Small, female    DOB: Aug 21, 1961  Age: 59 y.o. MRN: [id number]  CC:  Chief Complaint  Patient presents with  . Follow-up    HPI Laura Small presents for follow up of Type 2 diabetes mellitus and chronic back pain. She came into the clinic in a wheel chair and states that she's unable to walk. Her HgbA1c done on 06/20/2020 was >15.5%, she checks her blood glucose once a day. Her fasting blood glucose readings from 10/27- 11/04 ranges between 124 mg/dl to 219 mg/dl. Her fasting blood glucose this morning was 268 mg/dl. She states that she doesn't know if she's experiencing peripheral neuropathy due to her numbness to bilateral leg. She states that the bottom of her feet is burning, and her Monofilament test was negative. She states that she's compliant with her medications, but reports that she's been taking Humalog 3 units daily and not tid with each meal. She states that she continues to experience constant sharp 9/10 pain to her lower back that radiates to her legs. She states that no medication relieves her pain. She states that "nobody wants to help her with her pain and that's why she's in a dark place" and she requests Behavioral health referral. She denies suicidal nor homicidal ideation. She denies bladder/bowel incontinence, and saddle anesthesia. During her last visit, she stated that she has "Cauda Equina' but she declined going to the ED. She declines Covid and Influenza vaccines, Colonoscopy, Mammogram and Pap smear until her back pain problem is resolved. Overall, she offers no further complaint and will follow up with [facility] Orthopedic Surgeon for her back pain.      Past Medical History:  Diagnosis Date  . Back pain   . Chicken pox   . Diabetes mellitus without complication ([place])   . DJD (degenerative joint disease)   . Migraines   . PONV (postoperative nausea and vomiting)    usually has 1  episode of vomiting within 1 hour of anesthesia     Past Surgical History:  Procedure Laterality Date  . BACK SURGERY    . CHOLECYSTECTOMY    . INSERTION OF MESH N/A 02/12/2015   Procedure: INSERTION OF MESH;  Surgeon: Sherri Rad, MD;  Location: [facility] ORS;  Service: General;  Laterality: N/A;  . VENTRAL HERNIA REPAIR N/A 02/12/2015   Procedure: HERNIA REPAIR VENTRAL ADULT;  Surgeon: Sherri Rad, MD;  Location: [facility] ORS;  Service: General;  Laterality: N/A;    Family History  Problem Relation Age of Onset  . Diabetes Mother   . Gallbladder disease Mother   . Heart disease Father   . Alcohol abuse Father   . Gallbladder disease Father   . Heart disease Paternal Grandmother   . Colon cancer Neg Hx     Social History   Socioeconomic History  . Marital status: Divorced    Spouse name: Not on file  . Number of children: Not on file  . Years of education: Not on file  . Highest education level: Not on file  Occupational History  . Not on file  Tobacco Use  . Smoking status: Current Every Day Smoker    Packs/day: 0.05    Types: Cigarettes  . Smokeless tobacco: Never Used  . Tobacco comment: pt reports being in the process of quitting and is down to 1 cigarette a day  Substance and Sexual Activity  . Alcohol use: No    Comment:  reports alcohol abuse in teenage years  . Drug use: No  . Sexual activity: Not Currently    Partners: Male  Other Topics Concern  . Not on file  Social History Narrative   Lives with husband   Social Determinants of Health   Financial Resource Strain:   . Difficulty of Paying Living Expenses: Not on file  Food Insecurity:   . Worried About Charity fundraiser in the Last Year: Not on file  . Ran Out of Food in the Last Year: Not on file  Transportation Needs:   . Lack of Transportation (Medical): Not on file  . Lack of Transportation (Non-Medical): Not on file  Physical Activity:   . Days of Exercise per Week: Not on file  . Minutes of Exercise  per Session: Not on file  Stress:   . Feeling of Stress : Not on file  Social Connections:   . Frequency of Communication with Friends and Family: Not on file  . Frequency of Social Gatherings with Friends and Family: Not on file  . Attends Religious Services: Not on file  . Active Member of Clubs or Organizations: Not on file  . Attends Archivist Meetings: Not on file  . Marital Status: Not on file  Intimate Partner Violence:   . Fear of Current or Ex-Partner: Not on file  . Emotionally Abused: Not on file  . Physically Abused: Not on file  . Sexually Abused: Not on file    Outpatient Medications Prior to Visit  Medication Sig Dispense Refill  . aspirin 81 MG chewable tablet Chew by mouth daily.    Marland Kitchen b complex vitamins capsule Take 1 capsule by mouth daily.    . blood glucose meter kit and supplies KIT Dispense based on patient and insurance preference. Use up to four times daily as directed. (FOR ICD-9 250.00, 250.01). 1 each 0  . Insulin Glargine (BASAGLAR KWIKPEN) 100 UNIT/ML Inject 40 Units into the skin daily. 3 mL 5  . insulin lispro (HUMALOG) 100 UNIT/ML injection Inject 0.03 mLs (3 Units total) into the skin 3 (three) times daily before meals. 10 mL 11  . magnesium oxide (MAG-OX) 400 MG tablet Take 400 mg by mouth daily.    . Potassium 99 MG TABS Take 1 tablet by mouth daily.     No facility-administered medications prior to visit.    Allergies  Allergen Reactions  . Bee Venom Anaphylaxis and Swelling  . Advil [Ibuprofen] Swelling  . Jardiance [Empagliflozin] Rash  . Nitroglycerin Other (See Comments)    Pt states "causes me to feel off and weird all over and pressure in hands"  . Gabapentin     Reports being "intoxicated": dizzy, "loss of memory," "could not walk in a straight line," "all the effects of alcohol"  . Metformin And Related Diarrhea, Nausea And Vomiting and Swelling    Also loss of appetite  . Tramadol     "while I was taking it, I have no  memory of any of my classes"  . Prednisone Swelling    ROS Review of Systems  Constitutional: Negative.   Respiratory: Negative.   Cardiovascular: Negative.   Endocrine: Negative.   Musculoskeletal: Positive for back pain (chronic back pain).  Neurological: Negative.   Psychiatric/Behavioral: The patient is nervous/anxious.       Objective:    Physical Exam Constitutional:      Appearance: Normal appearance.  HENT:     Head: Normocephalic and atraumatic.  Cardiovascular:  Rate and Rhythm: Normal rate and regular rhythm.     Pulses: Normal pulses.     Heart sounds: Normal heart sounds.  Pulmonary:     Effort: Pulmonary effort is normal.     Breath sounds: Normal breath sounds.  Musculoskeletal:        General: Tenderness (with palpation to lower back) present.  Skin:    General: Skin is warm and dry.  Neurological:     General: No focal deficit present.     Mental Status: She is alert and oriented to person, place, and time. Mental status is at baseline.  Psychiatric:        Mood and Affect: Mood normal.        Behavior: Behavior normal.        Thought Content: Thought content normal.        Judgment: Judgment normal.     BP 136/83 (BP Location: Left Arm, Patient Position: Sitting, Cuff Size: Large)   Pulse 93   Temp 98 F (36.7 C) (Oral)   Resp 14   Wt 214 lb (97.1 kg)   SpO2 96%   BMI 33.52 kg/m  Wt Readings from Last 3 Encounters:  07/26/20 214 lb (97.1 kg)  06/20/20 200 lb (90.7 kg)  12/23/19 225 lb (102.1 kg)     Health Maintenance Due  Topic Date Due  . Hepatitis C Screening  Never done  . OPHTHALMOLOGY EXAM  Never done  . URINE MICROALBUMIN  Never done  . HIV Screening  Never done  . PAP SMEAR-Modifier  Never done  . MAMMOGRAM  Never done  . COLONOSCOPY  Never done    There are no preventive care reminders to display for this patient.  Lab Results  Component Value Date   TSH 3.903 06/10/2018   Lab Results  Component Value Date    WBC 14.8 (H) 06/21/2020   HGB 15.3 (H) 06/21/2020   HCT 43.7 06/21/2020   MCV 83.6 06/21/2020   PLT 288 06/21/2020   Lab Results  Component Value Date   NA 132 (L) 06/21/2020   K 2.8 (L) 06/21/2020   CO2 21 (L) 06/21/2020   GLUCOSE 234 (H) 06/21/2020   BUN 15 06/21/2020   CREATININE 0.58 06/21/2020   BILITOT 0.9 06/21/2020   ALKPHOS 164 (H) 06/21/2020   AST 29 06/21/2020   ALT 23 06/21/2020   PROT 6.8 06/21/2020   ALBUMIN 3.5 06/21/2020   CALCIUM 7.9 (L) 06/21/2020   ANIONGAP 9 06/21/2020   GFR 119.49 07/28/2018   Lab Results  Component Value Date   CHOL 282 (H) 10/14/2017   Lab Results  Component Value Date   HDL 30 (L) 10/14/2017   Lab Results  Component Value Date   LDLCALC 221 (H) 10/14/2017   Lab Results  Component Value Date   TRIG 157 (H) 10/14/2017   Lab Results  Component Value Date   CHOLHDL 9.4 10/14/2017   Lab Results  Component Value Date   HGBA1C >15.5 (H) 06/20/2020      Assessment & Plan:   1. Chronic low back pain, unspecified back pain laterality, unspecified whether sciatica present - She was advised to keep her appointment with [facility] Orthopedic Surgeon on 08/21/2020, and to go to the ED with worsening symptoms.  2. Type 2 diabetes mellitus with hyperglycemia, with long-term current use of insulin (HCC) - Her blood glucose is not controlled, she was advised to check her blood glucose tid, her fasting reading goals should be between  80-130 mg/dl. -Take her Humalog insulin 3 unit tid with meals and Basaglar 40 units before bedtime. She was also educated on the signs and symptoms of hypo/hyperglycemia and treatment. She verbalized understanding. She was encouraged to continue on low carb/non concentrated sweet diet. - Urine Microalbumin w/creat. ratio; Future  3. Anxiety - She will follow up with [facility] Behavioral health. She was also advised to call the Crisis help line number for worsening symptoms or go to the ED.  4. Health care  maintenance - Routine labs will be rechecked. - CBC w/Diff; Future - Comp Met (CMET); Future - Lipid panel; Future - Urinalysis; Future     Follow-up: Return in about 5 weeks (around 08/29/2020), or if symptoms worsen or fail to improve.    Quinnie Barcelo Jerold Coombe, NP

## 2020-08-02 ENCOUNTER — Telehealth: Payer: Self-pay

## 2020-08-02 NOTE — Telephone Encounter (Signed)
Left a message for patient to call the clinic so that her mental health appointment can be discussed.  Social work Tax inspector wants to discuss her seeing [facility], per the clinician.

## 2020-08-02 NOTE — Telephone Encounter (Signed)
Left message for patient asking her to call the clinic and provided the clinic's phone number.  Social work Tax inspector was going to refer patient to [facility].

## 2020-08-21 ENCOUNTER — Other Ambulatory Visit: Payer: Self-pay

## 2020-08-21 ENCOUNTER — Ambulatory Visit: Payer: Self-pay | Admitting: Specialist

## 2020-08-21 DIAGNOSIS — G8929 Other chronic pain: Secondary | ICD-10-CM

## 2020-08-21 NOTE — Progress Notes (Signed)
HPI  59 y/o with a 4 year history of back pain. No history of trauma. Initially, she was placed on a steroid pak, which she took for 3 days with marked side effects. She attributes her diabetes to being on the steroids. She has been given Tramadol, which does nothing. Gabapentin caused side effects. Today, she rates her pain as 9+/10. She came to the office pushing her own wheelchair and sat in the wheelchair for the remainder of the interview/exam. We offered to push her out in her w/c, but she declined saying that she needs to walk.    Physical Exam  When she stands, she has a 20 degree forward lift. On light palpation of her thoracic spine, she has pain in both buttocks. She has exquisite tenderness with light palpation over the greater trochanters. Similarly, over in the sciatic notches. DTRs 2+=. Seated SLR caused back pain bilaterally. Waddell signs are all positive. Throughout the exam, she exhibited marked pain behavior with sighing and told me that she can feel her heart beating.  Assessment  Chronic back pain. She does have age related degenerative changes, but her pain is way out of proportion. She would benefit from a psychological evaluation and treatment if necessary, although I'm not sure she will accept that assessment. There is no need to return to this clinic.   Plan

## 2020-08-22 ENCOUNTER — Other Ambulatory Visit: Payer: Self-pay

## 2020-08-22 DIAGNOSIS — Z0000 Encounter for general adult medical examination without abnormal findings: Secondary | ICD-10-CM

## 2020-08-22 DIAGNOSIS — E1165 Type 2 diabetes mellitus with hyperglycemia: Secondary | ICD-10-CM

## 2020-08-22 DIAGNOSIS — Z794 Long term (current) use of insulin: Secondary | ICD-10-CM

## 2020-08-23 LAB — COMPREHENSIVE METABOLIC PANEL
ALT: 14 IU/L (ref 0–32)
AST: 14 IU/L (ref 0–40)
Albumin/Globulin Ratio: 1.2 (ref 1.2–2.2)
Albumin: 3.8 g/dL (ref 3.8–4.9)
Alkaline Phosphatase: 166 IU/L — ABNORMAL HIGH (ref 44–121)
BUN/Creatinine Ratio: 13 (ref 9–23)
BUN: 9 mg/dL (ref 6–24)
Bilirubin Total: 0.3 mg/dL (ref 0.0–1.2)
CO2: 20 mmol/L (ref 20–29)
Calcium: 9.6 mg/dL (ref 8.7–10.2)
Chloride: 100 mmol/L (ref 96–106)
Creatinine, Ser: 0.7 mg/dL (ref 0.57–1.00)
GFR calc Af Amer: 111 mL/min/{1.73_m2} (ref >59)
GFR calc non Af Amer: 96 mL/min/{1.73_m2} (ref >59)
Globulin, Total: 3.1 g/dL (ref 1.5–4.5)
Glucose: 410 mg/dL — ABNORMAL HIGH (ref 65–99)
Potassium: 4.2 mmol/L (ref 3.5–5.2)
Sodium: 140 mmol/L (ref 134–144)
Total Protein: 6.9 g/dL (ref 6.0–8.5)

## 2020-08-23 LAB — MICROALBUMIN / CREATININE URINE RATIO
Creatinine, Urine: 56.6 mg/dL
Microalb/Creat Ratio: 17 mg/g creat (ref 0–29)
Microalbumin, Urine: 9.8 ug/mL

## 2020-08-23 LAB — CBC WITH DIFFERENTIAL/PLATELET
Basophils Absolute: 0.1 10*3/uL (ref 0.0–0.2)
Basos: 1 %
EOS (ABSOLUTE): 0 10*3/uL (ref 0.0–0.4)
Eos: 0 %
Hematocrit: 44.9 % (ref 34.0–46.6)
Hemoglobin: 15.1 g/dL (ref 11.1–15.9)
Immature Grans (Abs): 0 10*3/uL (ref 0.0–0.1)
Immature Granulocytes: 0 %
Lymphocytes Absolute: 3.6 10*3/uL — ABNORMAL HIGH (ref 0.7–3.1)
Lymphs: 34 %
MCH: 29 pg (ref 26.6–33.0)
MCHC: 33.6 g/dL (ref 31.5–35.7)
MCV: 86 fL (ref 79–97)
Monocytes Absolute: 0.7 10*3/uL (ref 0.1–0.9)
Monocytes: 6 %
Neutrophils Absolute: 6.3 10*3/uL (ref 1.4–7.0)
Neutrophils: 59 %
Platelets: 369 10*3/uL (ref 150–450)
RBC: 5.2 x10E6/uL (ref 3.77–5.28)
RDW: 12.8 % (ref 11.7–15.4)
WBC: 10.7 10*3/uL (ref 3.4–10.8)

## 2020-08-23 LAB — URINALYSIS
Bilirubin, UA: NEGATIVE
Leukocytes,UA: NEGATIVE
Nitrite, UA: NEGATIVE
Protein,UA: NEGATIVE
RBC, UA: NEGATIVE
Specific Gravity, UA: >=1.03 — AB (ref 1.005–1.030)
Urobilinogen, Ur: 0.2 mg/dL (ref 0.2–1.0)
pH, UA: 6 (ref 5.0–7.5)

## 2020-08-23 LAB — LIPID PANEL
Chol/HDL Ratio: 9.2 ratio — ABNORMAL HIGH (ref 0.0–4.4)
Cholesterol, Total: 276 mg/dL — ABNORMAL HIGH (ref 100–199)
HDL: 30 mg/dL — ABNORMAL LOW (ref >39)
LDL Chol Calc (NIH): 197 mg/dL — ABNORMAL HIGH (ref 0–99)
Triglycerides: 248 mg/dL — ABNORMAL HIGH (ref 0–149)
VLDL Cholesterol Cal: 49 mg/dL — ABNORMAL HIGH (ref 5–40)

## 2020-08-28 ENCOUNTER — Institutional Professional Consult (permissible substitution): Payer: Self-pay | Admitting: Licensed Clinical Social Worker

## 2020-08-30 ENCOUNTER — Other Ambulatory Visit: Payer: Self-pay

## 2020-08-30 ENCOUNTER — Ambulatory Visit: Payer: Self-pay | Admitting: Gerontology

## 2020-08-30 ENCOUNTER — Encounter: Payer: Self-pay | Admitting: Gerontology

## 2020-08-30 VITALS — BP 136/82 | HR 90 | Wt 220.9 lb

## 2020-08-30 DIAGNOSIS — Z794 Long term (current) use of insulin: Secondary | ICD-10-CM

## 2020-08-30 DIAGNOSIS — E1169 Type 2 diabetes mellitus with other specified complication: Secondary | ICD-10-CM | POA: Insufficient documentation

## 2020-08-30 DIAGNOSIS — E1165 Type 2 diabetes mellitus with hyperglycemia: Secondary | ICD-10-CM

## 2020-08-30 NOTE — Patient Instructions (Signed)
Fat and Cholesterol Restricted Eating Plan Getting too much fat and cholesterol in your diet may cause health problems. Choosing the right foods helps keep your fat and cholesterol at normal levels. This can keep you from getting certain diseases. Your doctor may recommend an eating plan that includes:  Total fat: ______% or less of total calories a day.  Saturated fat: ______% or less of total calories a day.  Cholesterol: less than _________mg a day.  Fiber: ______g a day. What are tips for following this plan? Meal planning  At meals, divide your plate into four equal parts: ? Fill one-half of your plate with vegetables and green salads. ? Fill one-fourth of your plate with whole grains. ? Fill one-fourth of your plate with low-fat (lean) protein foods.  Eat fish that is high in omega-3 fats at least two times a week. This includes mackerel, tuna, sardines, and salmon.  Eat foods that are high in fiber, such as whole grains, beans, apples, broccoli, carrots, peas, and barley. General tips   Work with your doctor to lose weight if you need to.  Avoid: ? Foods with added sugar. ? Fried foods. ? Foods with partially hydrogenated oils.  Limit alcohol intake to no more than 1 drink a day for nonpregnant women and 2 drinks a day for men. One drink equals 12 oz of beer, 5 oz of wine, or 1 oz of hard liquor. Reading food labels  Check food labels for: ? Trans fats. ? Partially hydrogenated oils. ? Saturated fat (g) in each serving. ? Cholesterol (mg) in each serving. ? Fiber (g) in each serving.  Choose foods with healthy fats, such as: ? Monounsaturated fats. ? Polyunsaturated fats. ? Omega-3 fats.  Choose grain products that have whole grains. Look for the word "whole" as the first word in the ingredient list. Cooking  Cook foods using low-fat methods. These include baking, boiling, grilling, and broiling.  Eat more home-cooked foods. Eat at restaurants and buffets  less often.  Avoid cooking using saturated fats, such as butter, cream, palm oil, palm kernel oil, and coconut oil. Recommended foods  Fruits  All fresh, canned (in natural juice), or frozen fruits. Vegetables  Fresh or frozen vegetables (raw, steamed, roasted, or grilled). Green salads. Grains  Whole grains, such as whole wheat or whole grain breads, crackers, cereals, and pasta. Unsweetened oatmeal, bulgur, barley, quinoa, or brown rice. Corn or whole wheat flour tortillas. Meats and other protein foods  Ground beef (85% or leaner), grass-fed beef, or beef trimmed of fat. Skinless chicken or turkey. Ground chicken or turkey. Pork trimmed of fat. All fish and seafood. Egg whites. Dried beans, peas, or lentils. Unsalted nuts or seeds. Unsalted canned beans. Nut butters without added sugar or oil. Dairy  Low-fat or nonfat dairy products, such as skim or 1% milk, 2% or reduced-fat cheeses, low-fat and fat-free ricotta or cottage cheese, or plain low-fat and nonfat yogurt. Fats and oils  Tub margarine without trans fats. Light or reduced-fat mayonnaise and salad dressings. Avocado. Olive, canola, sesame, or safflower oils. The items listed above may not be a complete list of foods and beverages you can eat. Contact a dietitian for more information. Foods to avoid Fruits  Canned fruit in heavy syrup. Fruit in cream or butter sauce. Fried fruit. Vegetables  Vegetables cooked in cheese, cream, or butter sauce. Fried vegetables. Grains  White bread. White pasta. White rice. Cornbread. Bagels, pastries, and croissants. Crackers and snack foods that contain trans fat   and hydrogenated oils. Meats and other protein foods  Fatty cuts of meat. Ribs, chicken wings, bacon, sausage, bologna, salami, chitterlings, fatback, hot dogs, bratwurst, and packaged lunch meats. Liver and organ meats. Whole eggs and egg yolks. Chicken and turkey with skin. Fried meat. Dairy  Whole or 2% milk, cream,  half-and-half, and cream cheese. Whole milk cheeses. Whole-fat or sweetened yogurt. Full-fat cheeses. Nondairy creamers and whipped toppings. Processed cheese, cheese spreads, and cheese curds. Beverages  Alcohol. Sugar-sweetened drinks such as sodas, lemonade, and fruit drinks. Fats and oils  Butter, stick margarine, lard, shortening, ghee, or bacon fat. Coconut, palm kernel, and palm oils. Sweets and desserts  Corn syrup, sugars, honey, and molasses. Candy. Jam and jelly. Syrup. Sweetened cereals. Cookies, pies, cakes, donuts, muffins, and ice cream. The items listed above may not be a complete list of foods and beverages you should avoid. Contact a dietitian for more information. Summary  Choosing the right foods helps keep your fat and cholesterol at normal levels. This can keep you from getting certain diseases.  At meals, fill one-half of your plate with vegetables and green salads.  Eat high-fiber foods, like whole grains, beans, apples, carrots, peas, and barley.  Limit added sugar, saturated fats, alcohol, and fried foods. This information is not intended to replace advice given to you by your health care provider. Make sure you discuss any questions you have with your health care provider. Document Revised: 05/12/2018 Document Reviewed: 05/26/2017 Elsevier Patient Education  2020 Elsevier Inc. Carbohydrate Counting for Diabetes Mellitus, Adult  Carbohydrate counting is a method of keeping track of how many carbohydrates you eat. Eating carbohydrates naturally increases the amount of sugar (glucose) in the blood. Counting how many carbohydrates you eat helps keep your blood glucose within normal limits, which helps you manage your diabetes (diabetes mellitus). It is important to know how many carbohydrates you can safely have in each meal. This is different for every person. A diet and nutrition specialist (registered dietitian) can help you make a meal plan and calculate how many  carbohydrates you should have at each meal and snack. Carbohydrates are found in the following foods:  Grains, such as breads and cereals.  Dried beans and soy products.  Starchy vegetables, such as potatoes, peas, and corn.  Fruit and fruit juices.  Milk and yogurt.  Sweets and snack foods, such as cake, cookies, candy, chips, and soft drinks. How do I count carbohydrates? There are two ways to count carbohydrates in food. You can use either of the methods or a combination of both. Reading "Nutrition Facts" on packaged food The "Nutrition Facts" list is included on the labels of almost all packaged foods and beverages in the [place] It includes:  The serving size.  Information about nutrients in each serving, including the grams (g) of carbohydrate per serving. To use the "Nutrition Facts":  Decide how many servings you will have.  Multiply the number of servings by the number of carbohydrates per serving.  The resulting number is the total amount of carbohydrates that you will be having. Learning standard serving sizes of other foods When you eat carbohydrate foods that are not packaged or do not include "Nutrition Facts" on the label, you need to measure the servings in order to count the amount of carbohydrates:  Measure the foods that you will eat with a food scale or measuring cup, if needed.  Decide how many standard-size servings you will eat.  Multiply the number of servings by 15.   Most carbohydrate-rich foods have about 15 g of carbohydrates per serving. ? For example, if you eat 8 oz (170 g) of strawberries, you will have eaten 2 servings and 30 g of carbohydrates (2 servings x 15 g = 30 g).  For foods that have more than one food mixed, such as soups and casseroles, you must count the carbohydrates in each food that is included. The following list contains standard serving sizes of common carbohydrate-rich foods. Each of these servings has about 15 g of  carbohydrates:   hamburger bun or  English muffin.   oz (15 mL) syrup.   oz (14 g) jelly.  1 slice of bread.  1 six-inch tortilla.  3 oz (85 g) cooked rice or pasta.  4 oz (113 g) cooked dried beans.  4 oz (113 g) starchy vegetable, such as peas, corn, or potatoes.  4 oz (113 g) hot cereal.  4 oz (113 g) mashed potatoes or  of a large baked potato.  4 oz (113 g) canned or frozen fruit.  4 oz (120 mL) fruit juice.  4-6 crackers.  6 chicken nuggets.  6 oz (170 g) unsweetened dry cereal.  6 oz (170 g) plain fat-free yogurt or yogurt sweetened with artificial sweeteners.  8 oz (240 mL) milk.  8 oz (170 g) fresh fruit or one small piece of fruit.  24 oz (680 g) popped popcorn. Example of carbohydrate counting Sample meal  3 oz (85 g) chicken breast.  6 oz (170 g) brown rice.  4 oz (113 g) corn.  8 oz (240 mL) milk.  8 oz (170 g) strawberries with sugar-free whipped topping. Carbohydrate calculation 1. Identify the foods that contain carbohydrates: ? Rice. ? Corn. ? Milk. ? Strawberries. 2. Calculate how many servings you have of each food: ? 2 servings rice. ? 1 serving corn. ? 1 serving milk. ? 1 serving strawberries. 3. Multiply each number of servings by 15 g: ? 2 servings rice x 15 g = 30 g. ? 1 serving corn x 15 g = 15 g. ? 1 serving milk x 15 g = 15 g. ? 1 serving strawberries x 15 g = 15 g. 4. Add together all of the amounts to find the total grams of carbohydrates eaten: ? 30 g + 15 g + 15 g + 15 g = 75 g of carbohydrates total. Summary  Carbohydrate counting is a method of keeping track of how many carbohydrates you eat.  Eating carbohydrates naturally increases the amount of sugar (glucose) in the blood.  Counting how many carbohydrates you eat helps keep your blood glucose within normal limits, which helps you manage your diabetes.  A diet and nutrition specialist (registered dietitian) can help you make a meal plan and calculate  how many carbohydrates you should have at each meal and snack. This information is not intended to replace advice given to you by your health care provider. Make sure you discuss any questions you have with your health care provider. Document Revised: 04/02/2017 Document Reviewed: 02/20/2016 Elsevier Patient Education  2020 Elsevier Inc.  

## 2020-08-30 NOTE — Progress Notes (Signed)
Established Patient Office Visit  Subjective:  Patient ID: Laura Small, female    DOB: [birth year]  Age: 59 y.o. MRN: [id number]  CC:  Chief Complaint  Patient presents with  . Follow-up    Lab results  . Diabetes    Taking medications, eating fresh fruits    HPI Laura Small presents for lab review. She has a history of Type 2 diabetes and lab review. Her Lipid panel done on 08/22/2020, LDL 197 mg/dl, Triglycerides 248 mg/dl , HDL 30 and Total cholesterol 276 mg/dl. Her HgbA1c done on 06/20/2020 was >15.5%. She states that she's compliant with her medications, checks her blood glucose 9 times daily and her fasting reading this morning was 283 mg/dl. She denies hypoglycemic symptoms, peripheral neuropathy and performs daily foot check. She declines Ophthalmology referral, stating that she will continue with her Ophthalmologist. She states that her last eye exam was 2 years ago and has no fund for follow up. Overall, she states that she's doing well and offers no further complaint.  Past Medical History:  Diagnosis Date  . Back pain   . Chicken pox   . Diabetes mellitus without complication ([place])   . DJD (degenerative joint disease)   . Migraines   . PONV (postoperative nausea and vomiting)    usually has 1 episode of vomiting within 1 hour of anesthesia     Past Surgical History:  Procedure Laterality Date  . BACK SURGERY    . CHOLECYSTECTOMY    . INSERTION OF MESH N/A 02/12/2015   Procedure: INSERTION OF MESH;  Surgeon: Sherri Rad, MD;  Location: [facility] ORS;  Service: General;  Laterality: N/A;  . VENTRAL HERNIA REPAIR N/A 02/12/2015   Procedure: HERNIA REPAIR VENTRAL ADULT;  Surgeon: Sherri Rad, MD;  Location: [facility] ORS;  Service: General;  Laterality: N/A;    Family History  Problem Relation Age of Onset  . Diabetes Mother   . Gallbladder disease Mother   . Heart disease Father   . Alcohol abuse Father   . Gallbladder disease Father   . Heart disease  Paternal Grandmother   . Colon cancer Neg Hx     Social History   Socioeconomic History  . Marital status: Divorced    Spouse name: Not on file  . Number of children: Not on file  . Years of education: Not on file  . Highest education level: Not on file  Occupational History  . Not on file  Tobacco Use  . Smoking status: Current Every Day Smoker    Packs/day: 0.05    Types: Cigarettes  . Smokeless tobacco: Never Used  . Tobacco comment: pt reports being in the process of quitting and is down to 1 cigarette a day  Substance and Sexual Activity  . Alcohol use: No    Comment: reports alcohol abuse in teenage years  . Drug use: No  . Sexual activity: Not Currently    Partners: Male  Other Topics Concern  . Not on file  Social History Narrative   Lives with husband   Social Determinants of Health   Financial Resource Strain: Not on file  Food Insecurity: Not on file  Transportation Needs: Not on file  Physical Activity: Not on file  Stress: Not on file  Social Connections: Not on file  Intimate Partner Violence: Not on file    Outpatient Medications Prior to Visit  Medication Sig Dispense Refill  . aspirin 81 MG chewable tablet Chew by mouth daily.    Marland Kitchen  b complex vitamins capsule Take 1 capsule by mouth daily.    . blood glucose meter kit and supplies KIT Dispense based on patient and insurance preference. Use up to four times daily as directed. (FOR ICD-9 250.00, 250.01). 1 each 0  . Insulin Glargine (BASAGLAR KWIKPEN) 100 UNIT/ML Inject 40 Units into the skin daily. 3 mL 5  . insulin lispro (HUMALOG) 100 UNIT/ML injection Inject 0.03 mLs (3 Units total) into the skin 3 (three) times daily before meals. 10 mL 11  . magnesium oxide (MAG-OX) 400 MG tablet Take 400 mg by mouth daily.    . Potassium 99 MG TABS Take 1 tablet by mouth daily.     No facility-administered medications prior to visit.    Allergies  Allergen Reactions  . Bee Venom Anaphylaxis and Swelling   . Advil [Ibuprofen] Swelling  . Jardiance [Empagliflozin] Rash  . Nitroglycerin Other (See Comments)    Pt states "causes me to feel off and weird all over and pressure in hands"  . Gabapentin     Reports being "intoxicated": dizzy, "loss of memory," "could not walk in a straight line," "all the effects of alcohol"  . Metformin And Related Diarrhea, Nausea And Vomiting and Swelling    Also loss of appetite  . Tramadol     "while I was taking it, I have no memory of any of my classes"  . Prednisone Swelling    ROS Review of Systems  Constitutional: Negative.   Respiratory: Negative.   Cardiovascular: Negative.   Endocrine: Negative.   Neurological: Negative.   Psychiatric/Behavioral: Negative.       Objective:    Physical Exam HENT:     Head: Normocephalic and atraumatic.  Cardiovascular:     Rate and Rhythm: Normal rate and regular rhythm.     Pulses: Normal pulses.     Heart sounds: Normal heart sounds.  Pulmonary:     Effort: Pulmonary effort is normal.     Breath sounds: Normal breath sounds.  Skin:    General: Skin is warm.  Neurological:     General: No focal deficit present.     Mental Status: She is alert and oriented to person, place, and time. Mental status is at baseline.  Psychiatric:        Mood and Affect: Mood normal.        Behavior: Behavior normal.        Thought Content: Thought content normal.        Judgment: Judgment normal.     BP 136/82 (BP Location: Left Arm, Patient Position: Sitting, Cuff Size: Large)   Pulse 90   Wt 220 lb 14.4 oz (100.2 kg)   SpO2 96%   BMI 34.60 kg/m  Wt Readings from Last 3 Encounters:  08/30/20 220 lb 14.4 oz (100.2 kg)  07/26/20 214 lb (97.1 kg)  06/20/20 200 lb (90.7 kg)   She was encouraged to loose weight.  Health Maintenance Due  Topic Date Due  . Hepatitis C Screening  Never done  . OPHTHALMOLOGY EXAM  Never done  . COVID-19 Vaccine (1) Never done  . HIV Screening  Never done  . PAP  SMEAR-Modifier  Never done  . MAMMOGRAM  Never done  . COLONOSCOPY  Never done    There are no preventive care reminders to display for this patient.  Lab Results  Component Value Date   TSH 3.903 06/10/2018   Lab Results  Component Value Date   WBC 10.7 08/22/2020  HGB 15.1 08/22/2020   HCT 44.9 08/22/2020   MCV 86 08/22/2020   PLT 369 08/22/2020   Lab Results  Component Value Date   NA 140 08/22/2020   K 4.2 08/22/2020   CO2 20 08/22/2020   GLUCOSE 410 (H) 08/22/2020   BUN 9 08/22/2020   CREATININE 0.70 08/22/2020   BILITOT 0.3 08/22/2020   ALKPHOS 166 (H) 08/22/2020   AST 14 08/22/2020   ALT 14 08/22/2020   PROT 6.9 08/22/2020   ALBUMIN 3.8 08/22/2020   CALCIUM 9.6 08/22/2020   ANIONGAP 9 06/21/2020   GFR 119.49 07/28/2018   Lab Results  Component Value Date   CHOL 276 (H) 08/22/2020   Lab Results  Component Value Date   HDL 30 (L) 08/22/2020   Lab Results  Component Value Date   LDLCALC 197 (H) 08/22/2020   Lab Results  Component Value Date   TRIG 248 (H) 08/22/2020   Lab Results  Component Value Date   CHOLHDL 9.2 (H) 08/22/2020   Lab Results  Component Value Date   HGBA1C >15.5 (H) 06/20/2020      Assessment & Plan:     1. Type 2 diabetes mellitus with hyperglycemia, with long-term current use of insulin (HCC) - Her HgbA1c was 15.5%, her goal should be less than 7%. She will continue on current treatment regimen, check blood glucose tid, record and bring log to follow up appointment. She will continue on low carb/ non concentrated sweet diet. - HgB A1c; Future  2. Hyperlipidemia associated with type 2 diabetes mellitus ([place]) - Her LDL was 197 mg/dl, she declined taking any statin or another lipid lowering agent stating that" I want to preserve my liver". She was advised to continue on low fat/cholesterol diet. Will follow up at her next visit.   Follow-up: Return in about 6 weeks (around 10/10/2020), or if symptoms worsen or fail to  improve.    Monterius Rolf Jerold Coombe, NP

## 2020-09-11 ENCOUNTER — Ambulatory Visit: Payer: Self-pay

## 2020-10-03 ENCOUNTER — Other Ambulatory Visit: Payer: Self-pay

## 2020-10-03 DIAGNOSIS — Z794 Long term (current) use of insulin: Secondary | ICD-10-CM

## 2020-10-03 DIAGNOSIS — E1165 Type 2 diabetes mellitus with hyperglycemia: Secondary | ICD-10-CM

## 2020-10-04 LAB — HEMOGLOBIN A1C
Est. average glucose Bld gHb Est-mCnc: 278 mg/dL
Hgb A1c MFr Bld: 11.3 % — ABNORMAL HIGH (ref 4.8–5.6)

## 2020-10-09 ENCOUNTER — Ambulatory Visit: Payer: Self-pay

## 2020-10-10 ENCOUNTER — Telehealth: Payer: Self-pay | Admitting: Gerontology

## 2020-11-06 ENCOUNTER — Other Ambulatory Visit: Payer: Self-pay

## 2020-11-06 ENCOUNTER — Ambulatory Visit: Payer: Self-pay | Admitting: "Endocrinology

## 2020-11-06 VITALS — Ht 67.0 in | Wt 225.0 lb

## 2020-11-06 DIAGNOSIS — E1165 Type 2 diabetes mellitus with hyperglycemia: Secondary | ICD-10-CM

## 2020-11-06 DIAGNOSIS — Z794 Long term (current) use of insulin: Secondary | ICD-10-CM

## 2020-11-07 NOTE — Progress Notes (Signed)
Follow up Diabetes/ [facility]     Patient ID: Laura Small, female   DOB: 10/03/1960, 60 y.o.   MRN: [id number] Assessment:  Laura Small is a 60 y.o. female who is seen in follow up for No primary diagnosis found. at the request of Pcp, No.  Encounter Diagnoses No diagnosis found.  Assessment  Laura Small is a 60 yo woman with DM, first diagnosed as T2DM around four years ago. Her main challenges are tolerance to a medication regimen, as she reports regular nausea with Humalog and previous intolerance to Metformin and Jardiance. Her history does not present a clear type 2 DM picture, so she is recommended to have C-peptide and GAD antibodies checked. In addition to the nausea, a main concern she expresses is that medical providers have not listened to her and have blown off her complaints. To distinguish for her whether the nausea is from eating or Humalog, we recommend she try a 2:50 > 150 sliding scale insulin at bedtime. If the nausea improves, she can stick to this regimen. If the nausea does not improve, she should return to Humalog at meals, increasing to 6 units from 3. She is also recommended to take Humalog prior to eating, not after.  Plan:  1. Send c-pep, GAD-Ab 2. For 3-4 days, do a 2:50 >150 correction scale of Humalog at bedtime. If the nausea does not improve, she should return to Humalog at meals, increasing to 6 units from 3. If nausea improves, she can stick to this bedtime sliding scale regimen. She should take Humalog prior to eating, not after. 3. Follow up in three months    There are no Patient Instructions on file for this visit.   No orders of the defined types were placed in this encounter.    Subjective:  Laura Small is a 59yo woman with a PMH of T2DM and degenerative disease presenting for follow-up of complex T2DM management. She was diagnosed around four years ago, and had a bout of DKA most recently in September 2021. Her  latest A1c was 11.1 in 09/2020, down from >15.5 in 05/2020, which was increased from 7.0 in 05/2019. Today she reports checking her glucose readings 6-8x/day, with fasting glucose readings in the 130s, with the lowest at 97. Her main complaint today is nausea with her current medication regimen, with a desire to go off all injections. She reports feeling nauseous with Humalog at dinner, and taking the Humalog up to an hour after dinner with no improvement. She reports no nausea with the morning basaglar, and does not usually eat breakfast. She reports getting full quickly at lunch and dinner meals. She reports previously intolerance to Metformin (discomfort in her RLQ) and Jardiance (rash across her chest). She reports she would like to go off injections entirely because of the pain, and is very motivated to make this change. After a bout of DKA in Sept 2021, she clamped down on her Lantus adherence and diet, focusing on whole grains, fruits, and fish. She denies alcohol and drug consumption, and has reduced smoking to one cigarette/day. She has limited exercise ability due to chronic back pain limiting her to a wheelchair. She reports gaining ~20-25 lbs in the past few months. Her mother had DM, first as gestational diabetes than progressed after pregnancy.     Review of Systems  Constitutional: Positive for unexpected weight change.  Gastrointestinal: Positive for nausea.  Musculoskeletal: Positive for back pain.    Laura Small  has a past medical history of Back pain, Chicken pox, Diabetes mellitus without complication (HCC), DJD (degenerative joint disease), Migraines, and PONV (postoperative nausea and vomiting).  Family History, Social History, current Medications and allergies reviewed and updated in Epic.  Objective:    Height 5\' 7"  (1.702 m), weight 225 lb (102.1 kg). Physical Exam Constitutional:      General: She is not in acute distress.    Appearance: She is not  ill-appearing, toxic-appearing or diaphoretic.     Comments: Patient is in wheelchair  HENT:     Head: Normocephalic.  Abdominal:     General: There is no distension.     Tenderness: There is no abdominal tenderness. There is no guarding or rebound.  Musculoskeletal:     Right lower leg: No edema.     Left lower leg: No edema.  Skin:    Findings: No bruising, lesion or rash.  Neurological:     Mental Status: She is alert.         Data : I have personally reviewed pertinent labs and imaging studies, if indicated,  with the patient in clinic today.   Lab Orders  No laboratory test(s) ordered today    HC Readings from Last 3 Encounters:  No data found for [facility]    Wt Readings from Last 3 Encounters:  11/06/20 225 lb (102.1 kg)  10/03/20 213 lb (96.6 kg)  08/30/20 220 lb 14.4 oz (100.2 kg)

## 2020-12-10 ENCOUNTER — Telehealth: Payer: Self-pay | Admitting: Pharmacy Technician

## 2020-12-10 NOTE — Telephone Encounter (Signed)
Patient failed to provide 2022 proof of income.  No additional medication assistance will be provided by [facility] without the required proof of income documentation.  Patient notified by letter.  [name] Care Manager [facility]   [name] [place], [place]  [address]    December 10, 2020    Laura Small [address] [place], [place]  [address]  Dear Laura Small:  This is to inform you that you are no longer eligible to receive medication assistance at [facility].  The reason(s) are:    _____Your total gross monthly household income exceeds 250% of the Federal Poverty Level.   _____Tangible assets (savings, checking, stocks/bonds, pension, retirement, etc.) exceeds our limit  _____You are eligible to receive benefits from [facility], [facility] or HIV Medication              Assistance Program _____You are eligible to receive benefits from a Medicare Part "D" plan _____You have prescription insurance  _____You are not an [facility] resident __X__Failure to provide all requested proof of income information for 2022.    Medication assistance will resume once all requested financial information has been returned to our clinic.  If you have questions, please contact our clinic at [phone].    Thank you,  [facility]

## 2020-12-15 ENCOUNTER — Other Ambulatory Visit: Payer: Self-pay

## 2020-12-15 ENCOUNTER — Ambulatory Visit: Payer: Self-pay | Admitting: Pharmacy Technician

## 2020-12-15 DIAGNOSIS — Z79899 Other long term (current) drug therapy: Secondary | ICD-10-CM

## 2020-12-15 NOTE — Progress Notes (Signed)
Received updated proof of income.  Patient eligible to receive medication assistance at [facility] until time for re-certification in 2023, and as long as eligibility requirements continue to be met.   J.  Care Manager [facility]  

## 2021-01-21 ENCOUNTER — Other Ambulatory Visit: Payer: Self-pay

## 2021-01-21 MED FILL — Insulin Glargine Soln Pen-Injector 100 Unit/ML: SUBCUTANEOUS | 38 days supply | Qty: 15 | Fill #0 | Status: AC

## 2021-01-21 MED FILL — Insulin Pen Needle 32 G X 4 MM (1/6" or 5/32"): 100 days supply | Qty: 100 | Fill #0 | Status: AC

## 2021-01-21 MED FILL — Insulin Syringe/Needle U-100 0.3 ML 31 x 1/4" (6 MM): 33 days supply | Qty: 100 | Fill #0 | Status: AC

## 2021-01-21 MED FILL — Insulin Lispro Inj Soln 100 Unit/ML: INTRAMUSCULAR | 28 days supply | Qty: 10 | Fill #0 | Status: AC

## 2021-01-21 MED FILL — Insulin Glargine Soln Pen-Injector 100 Unit/ML: SUBCUTANEOUS | Qty: 5 | Fill #0 | Status: CN

## 2021-02-05 ENCOUNTER — Telehealth: Payer: Self-pay | Admitting: Gerontology

## 2021-02-05 ENCOUNTER — Ambulatory Visit: Payer: Self-pay

## 2021-02-05 NOTE — Telephone Encounter (Signed)
Good morning Laura Small,  The providers that are supposed to see you tonight are no longer able to come into the clinic. We are sorry for any inconvenience this may cause you.  Please call the clinic to reschedule as soon as possible.  Best, [facility]

## 2021-02-08 ENCOUNTER — Other Ambulatory Visit: Payer: Self-pay

## 2021-02-27 ENCOUNTER — Other Ambulatory Visit: Payer: Self-pay | Admitting: Gerontology

## 2021-02-27 ENCOUNTER — Other Ambulatory Visit: Payer: Self-pay

## 2021-02-27 DIAGNOSIS — E1165 Type 2 diabetes mellitus with hyperglycemia: Secondary | ICD-10-CM

## 2021-02-27 DIAGNOSIS — Z794 Long term (current) use of insulin: Secondary | ICD-10-CM

## 2021-02-27 DIAGNOSIS — E1169 Type 2 diabetes mellitus with other specified complication: Secondary | ICD-10-CM

## 2021-02-27 MED ORDER — BASAGLAR KWIKPEN 100 UNIT/ML ~~LOC~~ SOPN
PEN_INJECTOR | SUBCUTANEOUS | 5 refills | Status: AC
  Filled 2021-02-27: qty 15, 38d supply, fill #0
  Filled 2021-04-11: qty 15, 38d supply, fill #1
  Filled 2021-06-04: qty 15, 38d supply, fill #2

## 2021-02-28 ENCOUNTER — Other Ambulatory Visit: Payer: Self-pay

## 2021-02-28 ENCOUNTER — Telehealth: Payer: Self-pay

## 2021-02-28 ENCOUNTER — Other Ambulatory Visit: Payer: Self-pay | Admitting: Emergency Medicine

## 2021-02-28 DIAGNOSIS — E1165 Type 2 diabetes mellitus with hyperglycemia: Secondary | ICD-10-CM

## 2021-02-28 MED ORDER — BASAGLAR KWIKPEN 100 UNIT/ML ~~LOC~~ SOPN
PEN_INJECTOR | SUBCUTANEOUS | 1 refills | Status: DC
  Filled 2021-02-28: qty 15, fill #0

## 2021-02-28 NOTE — Telephone Encounter (Signed)
Patient needs refill on Basaglar only has 4 days left.

## 2021-03-21 ENCOUNTER — Other Ambulatory Visit: Payer: Self-pay

## 2021-04-03 ENCOUNTER — Other Ambulatory Visit: Payer: Self-pay

## 2021-04-03 DIAGNOSIS — E1165 Type 2 diabetes mellitus with hyperglycemia: Secondary | ICD-10-CM

## 2021-04-03 DIAGNOSIS — E1169 Type 2 diabetes mellitus with other specified complication: Secondary | ICD-10-CM

## 2021-04-04 LAB — LIPID PANEL
Chol/HDL Ratio: 9.6 ratio — ABNORMAL HIGH (ref 0.0–4.4)
Cholesterol, Total: 269 mg/dL — ABNORMAL HIGH (ref 100–199)
HDL: 28 mg/dL — ABNORMAL LOW (ref >39)
LDL Chol Calc (NIH): 206 mg/dL — ABNORMAL HIGH (ref 0–99)
Triglycerides: 181 mg/dL — ABNORMAL HIGH (ref 0–149)
VLDL Cholesterol Cal: 35 mg/dL (ref 5–40)

## 2021-04-04 LAB — HEMOGLOBIN A1C
Est. average glucose Bld gHb Est-mCnc: 289 mg/dL
Hgb A1c MFr Bld: 11.7 % — ABNORMAL HIGH (ref 4.8–5.6)

## 2021-04-09 ENCOUNTER — Ambulatory Visit: Payer: Self-pay

## 2021-04-11 ENCOUNTER — Other Ambulatory Visit: Payer: Self-pay

## 2021-04-11 ENCOUNTER — Institutional Professional Consult (permissible substitution): Payer: Self-pay | Admitting: Licensed Clinical Social Worker

## 2021-04-11 ENCOUNTER — Other Ambulatory Visit: Payer: Self-pay | Admitting: Gerontology

## 2021-04-11 ENCOUNTER — Telehealth: Payer: Self-pay

## 2021-04-11 MED ORDER — "INSULIN SYRINGE-NEEDLE U-100 31G X 5/16"" 0.3 ML MISC"
99 refills | Status: DC
  Filled 2021-04-11: qty 100, 33d supply, fill #0

## 2021-04-11 MED ORDER — NOVOFINE PEN NEEDLE 32G X 6 MM MISC
99 refills | Status: DC
  Filled 2021-04-11: qty 100, 100d supply, fill #0

## 2021-04-11 MED FILL — Insulin Lispro Inj Soln 100 Unit/ML: INTRAMUSCULAR | 28 days supply | Qty: 10 | Fill #1 | Status: AC

## 2021-04-11 NOTE — Telephone Encounter (Signed)
Social worker left a message for patient letting her know that we needed to reschedule her 2 pm appointment with our LCSWA and left the number for her to call and reschedule.  Patient's appointment will be cancelled now and rescheduled when patient calls clinic.

## 2021-04-11 NOTE — Telephone Encounter (Signed)
Social worker left message for patient asking her to call clinic before 2 pm and providing her with clinic's phone number.  Did not leave much information on the machine and when patient calls back, will inform her that her 2 pm appointment needs to be cancelled due to our therapist associate having to leave before the appointment time.

## 2021-04-16 ENCOUNTER — Ambulatory Visit: Payer: Self-pay | Admitting: Endocrinology

## 2021-04-16 ENCOUNTER — Institutional Professional Consult (permissible substitution): Payer: Self-pay | Admitting: Licensed Clinical Social Worker

## 2021-04-16 ENCOUNTER — Other Ambulatory Visit: Payer: Self-pay

## 2021-04-16 VITALS — BP 117/57 | HR 97

## 2021-04-16 DIAGNOSIS — Z794 Long term (current) use of insulin: Secondary | ICD-10-CM

## 2021-04-16 DIAGNOSIS — E1165 Type 2 diabetes mellitus with hyperglycemia: Secondary | ICD-10-CM

## 2021-04-16 MED ORDER — GLUCOSE BLOOD VI STRP
ORAL_STRIP | 4 refills | Status: DC
  Filled 2021-04-16: qty 100, 25d supply, fill #0

## 2021-04-16 NOTE — Progress Notes (Signed)
Follow up Diabetes/ [facility]     Patient ID: Laura Small, female   DOB: 1960-12-03, 60 y.o.   MRN: [id number] Assessment/Pla  Laura Small is a 60 y.o. female who is seen in follow up for Type 2 diabetes mellitus with hyperglycemia, with long-term current use of insulin (HCC) [E11.65, Z79.4]. With an uptrending Hb A1c (11.3 on 1/12 to 11.7 on 04/03/21) and lower than expected blood glucoses (fasting of 150-175), additional glucose measurements at night before bedtime is needed to get a clearer picture of her glucose control. Also, a glucose blood test should be measured in parallel with the glycosometer measurement the next time she needs a HbA1c (October 2022) to clarify this discrepancy. Her Basaglar will be increased two units per week as long as her blood glucose stays above 100. If her blood glucose goes below 70, she was instructed to reduce her insulin by 2 units.  She needed more testing strips for her glycosometer so more strips are ordered.      Patient Instructions  Increase Basaglar by 2 units a week as long as all blood sugars are over 100.  Reduce by 2 units if any are less than 70.    Check blood sugars before meals and at bedtime.   I do not believe your pain is related to neuropathy.   To check discrepancy in blood sugars and A1c, bring meter with you are each visit.  With next blood draw we can get glucose in lab and with your meter at the same time to check.     No orders of the defined types were placed in this encounter.    Subjective:  Laura Small is a 60 yo female, who has a past medical history of Back pain, Chicken pox, Diabetes mellitus without complication (HCC), DJD (degenerative joint disease), Migraines, and PONV (postoperative nausea and vomiting) presenting for diabetes care management. She was diagnosed with diabetes in 2018, which she states was caused by prednisone being prescribed to treat piriformis syndrome. At  this event, she had frequent urination, was thirsty and itchy with a blood glucose of 800.   Since her last visit on 11/06/20, her HbA1c has gone up from 11.3 on 10/03/20 to 11.7 on 04/03/21. She has been measuring her blood sugar three times a day, when fasting (at 150-175), before lunch (at 100-125) and later in the afternoon (around 98). She forgets to check her blood sugar before bedtime. She checks her blood pressure twice a week when she sees bulging veins in her hands and it is usually 150/90. She reports sensation in her feet and hands. She reports that after her insulin regimen was changed during the last visit, she had episodes of vomiting. She switched back to her old regimen and started eating before her Humalog injection, which result in resolution of her nausea. She does not report any current nausea or vomiting. She denies polyuria, polydipsia, having the sensation of food being stuck in her throat, weight loss or nocturia. She denies shakes/chills, feeling like she will pass out or dizziness.   She changed her diet "drastically" when now eating frozen fruit, veggies and fish. She eats wheat bread and oatmeal. She eats breakfast and lunch, but not dinner.  She has allergies to Jardiance (reported a rash lasting one week from armpit to armpit after the second day of taking the medication), ibuprofen (swelling in legs), bees, nitroglycerin (vomiting), gabapentin (memory difficulties), metformin (severe pinching sensation in LLQ and  decreased appetite) and prednisone (prednisone induced diabetes).  She does not drink alcohol. She smokes cigarettes but denied to say how many a day. She does not use any other drugs.   She reports shooting pain in her legs since 2017 and the pain prevents her from walking more than to the door of a room and back. She attributes the pain to neuromas and piriformis syndrome.   Review of Systems  Eyes:        No changes in vision, no blurry vision  Respiratory:   Negative for shortness of breath.   Cardiovascular:  Negative for chest pain and leg swelling.     Family History, Social History, current Medications and allergies reviewed and updated in Epic.  Objective:    Blood pressure (!) 117/57, pulse 97. Physical Exam Constitutional:      Comments: Patient in wheelchair  Cardiovascular:     Rate and Rhythm: Normal rate and regular rhythm.     Pulses: Normal pulses.     Heart sounds: Normal heart sounds, S1 normal and S2 normal.  Pulmonary:     Effort: Pulmonary effort is normal.     Breath sounds: Normal breath sounds. No stridor. No wheezing, rhonchi or rales.  Musculoskeletal:     Right lower leg: No edema.     Left lower leg: No edema.  Neurological:     Mental Status: She is alert.  Monofilament test: Patient has long and thick toenails. Her sensation was intact, but diminished on her anterior and posterior feet.    Data : I have personally reviewed pertinent labs and imaging studies, if indicated,  with the patient in clinic today.   Lab Orders  No laboratory test(s) ordered today    HC Readings from Last 3 Encounters:  No data found for [facility]    Wt Readings from Last 3 Encounters:  04/03/21 221 lb 3.2 oz (100.3 kg)  11/06/20 225 lb (102.1 kg)  10/03/20 213 lb (96.6 kg)

## 2021-04-16 NOTE — Patient Instructions (Addendum)
Increase Basaglar by 2 units a week as long as all blood sugars are over 100.  Reduce by 2 units if any are less than 70.    Check blood sugars before meals and at bedtime.   I do not believe your pain is related to neuropathy.   To check discrepancy in blood sugars and A1c, bring meter with you are each visit.  With next blood draw we can get glucose in lab and with your meter at the same time to check.

## 2021-04-17 ENCOUNTER — Telehealth: Payer: Self-pay

## 2021-04-17 ENCOUNTER — Other Ambulatory Visit: Payer: Self-pay

## 2021-04-17 NOTE — Telephone Encounter (Signed)
Social worker called patient twice to schedule her appointment with the LCSWA and left a message for her to call the clinic to reschedule.

## 2021-04-24 NOTE — Telephone Encounter (Signed)
Social worker called patient to reschedule her appointment with the LCSWA and patient was advised that it was a one hour appointment.

## 2021-04-30 ENCOUNTER — Telehealth: Payer: Self-pay | Admitting: Gerontology

## 2021-04-30 NOTE — Telephone Encounter (Signed)
Two attempts to contact the pt to schedule an appt were made. A voice message was left providing the clinic number.

## 2021-04-30 NOTE — Telephone Encounter (Signed)
-----   Message from Efraim Kaufmann, [place] sent at 04/25/2021 12:47 PM EDT ----- LVM 04/25/21 pls call again to schedule appt ----- Message ----- From: Rolm Gala, NP Sent: 04/18/2021   9:40 AM EDT To: Bod Admin  Pls schedule an in clinic appointment for Ms. Mose, and make a telephone note. Thank you

## 2021-05-07 ENCOUNTER — Other Ambulatory Visit: Payer: Self-pay

## 2021-05-07 ENCOUNTER — Ambulatory Visit: Payer: Self-pay | Admitting: Licensed Clinical Social Worker

## 2021-05-07 DIAGNOSIS — F331 Major depressive disorder, recurrent, moderate: Secondary | ICD-10-CM

## 2021-05-07 DIAGNOSIS — F4310 Post-traumatic stress disorder, unspecified: Secondary | ICD-10-CM

## 2021-05-07 DIAGNOSIS — F419 Anxiety disorder, unspecified: Secondary | ICD-10-CM

## 2021-05-07 DIAGNOSIS — F411 Generalized anxiety disorder: Secondary | ICD-10-CM

## 2021-05-07 NOTE — Progress Notes (Signed)
I, Lakaisha Danish B Tyrika Newman, MD, have reviewed all documentation for this visit. The documentation on 05/07/21 for the exam, diagnosis, procedures, and orders are all accurate and complete. 

## 2021-05-08 ENCOUNTER — Telehealth: Payer: Self-pay

## 2021-05-08 NOTE — Telephone Encounter (Signed)
Lvm letting pt know about resceduele appointment for 05/22/21 at 10AM

## 2021-05-15 ENCOUNTER — Other Ambulatory Visit: Payer: Self-pay

## 2021-05-15 ENCOUNTER — Ambulatory Visit: Payer: Self-pay | Admitting: Licensed Clinical Social Worker

## 2021-05-15 DIAGNOSIS — F331 Major depressive disorder, recurrent, moderate: Secondary | ICD-10-CM

## 2021-05-15 DIAGNOSIS — F4312 Post-traumatic stress disorder, chronic: Secondary | ICD-10-CM

## 2021-05-20 ENCOUNTER — Other Ambulatory Visit: Payer: Self-pay

## 2021-05-20 NOTE — BH Specialist Note (Signed)
Integrated Behavioral Health Follow Up In-Person Visit  MRN: [id number] Name: Laura Small [facility]   Total time: 60 minutes  Types of Service: Individual psychotherapy   Interpretor:No. Interpretor Name and Language: N/A  Subjective: Laura Small is a 60 y.o. female accompanied by  herself Patient was referred by Laura Small for mental health. Patient reports the following symptoms/concerns: The patient reports that she has been doing the same since her last follow-up appointment. She discussed health stressors impacting her life. She shared her past experiences with medical providers and her attempts to find help for her muscle weakness, chronic pain, and inability to walk. She noted that she is requesting an ultrasound to look for damage that MRI's can not find. She shared that she has to walk on the sides of her feet and cannot stand flat footed anymore and doctors do not believe anything is wrong with her. Laura Small shared that she feels irritable and anger all day every day. She explained that she is in pain every day and lives in a 75 sq ft section of her home and is no longer able to function. She noted she feels very depressed feels it is unfair for her brother to be caring for her and not living his life. The patient denied any suicidal or homicidal thoughts today.  Duration of problem: Years; Severity of problem: moderate  Objective: Mood: Depressed and Affect: Appropriate Risk of harm to self or others: No plan to harm self or others  Life Context: Family and Social: see above School/Work: see above Self-Care: see above Life Changes: see above  Patient and/or Family's Strengths/Protective Factors: Concrete supports in place (healthy food, safe environments, etc.)  Goals Addressed: Patient will:  Reduce symptoms of: agitation, anxiety, depression, insomnia, and stress   Increase knowledge and/or ability of: coping skills, healthy habits, self-management  skills, and stress reduction   Demonstrate ability to: Increase healthy adjustment to current life circumstances, Increase adequate support systems for patient/family, and Increase motivation to adhere to plan of care  Progress towards Goals: Ongoing  Interventions: Interventions utilized:  Supportive Counseling was utilized by the clinician during today's follow up session. The clinician processed with the patient how they have been doing since the last follow-up session. The clinician provided a space for the patient to ventilate their frustrations regarding their current life circumstances. Clinician measured the patient's anxiety and depression on a numerical scale.  Clinician informed patient that exploring medication with their primary care provider is an option, not a requirement; advised patient can always revisit medication later if desired, even if as a short-term consideration. Clinician encouraged the patient to practice self care and deep breathing throughout the week. The clinician encouraged the patient to utilize their coping skills to deal with their current life circumstances.    Standardized Assessments completed: Patient declined screening  Assessment: Patient currently experiencing see above.   Patient may benefit from see above.  Plan: Follow up with behavioral health clinician on : 05/30/2022 at 3:00 PM  Behavioral recommendations:  Referral(s): [facility] [employer] (In Clinic) "From scale of 1-10, how likely are you to follow plan?":   Laura Small, LCSWA
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
## 2021-05-22 ENCOUNTER — Ambulatory Visit: Payer: Self-pay | Admitting: Gerontology

## 2021-05-30 ENCOUNTER — Other Ambulatory Visit: Payer: Self-pay

## 2021-05-30 ENCOUNTER — Ambulatory Visit: Payer: Self-pay | Admitting: Licensed Clinical Social Worker

## 2021-05-30 DIAGNOSIS — F411 Generalized anxiety disorder: Secondary | ICD-10-CM

## 2021-05-30 DIAGNOSIS — F331 Major depressive disorder, recurrent, moderate: Secondary | ICD-10-CM

## 2021-05-30 DIAGNOSIS — F4312 Post-traumatic stress disorder, chronic: Secondary | ICD-10-CM

## 2021-05-30 NOTE — BH Specialist Note (Signed)
Integrated Behavioral Health Follow Up In-Person Visit  MRN: [id number] Name: Laura Small [facility]   Total time: 60 minutes  Types of Service: Individual psychotherapy  Interpretor:No. Interpretor Name and Language: N/A  Subjective: Laura Small is a 60 y.o. female accompanied by  herself Patient was referred by Laura Shadow, NP for Mental Health. Patient reports the following symptoms/concerns: The patient reports that she has been doing the same since her last follow-up appointment. She described being in such severe pain that she screamed for hours at the top of her lungs. She noted that she is tired of doctors promising her that they are going to help her then look at one MRI and shut her down. She explained that she feels her previous primary care doctor had written something in her notes that is causing other providers to not assist her. She noted that she is unable to sleep because of the pain. She discussed other health and family related stressors impacting her life. She shared that she has been in contact with a friend in [place] who may be able to assist her.The patient endorsed suicidal thoughts, but denied  homicidal thoughts. She denied having any plan to end her life. She stated that she does not have access to means and specifically denied access to firearms. She explained that her she would not end her life because she does not want to hurt her brother.  Duration of problem: years; Severity of problem: moderate  Objective: Mood: Angry and Affect: Labile Risk of harm to self or others: Suicidal ideation  Life Context: Family and Social: see above School/Work: see above Self-Care: see above Life Changes: see above  Patient and/or Family's Strengths/Protective Factors: Concrete supports in place (healthy food, safe environments, etc.)  Goals Addressed: Patient will:  Reduce symptoms of: agitation, anxiety, depression, insomnia, mood instability,  and stress   Increase knowledge and/or ability of: coping skills, healthy habits, self-management skills, and stress reduction   Demonstrate ability to: Increase healthy adjustment to current life circumstances, Increase adequate support systems for patient/family, and Increase motivation to adhere to plan of care  Progress towards Goals: Ongoing  Interventions: Interventions utilized:  CBT Cognitive Behavioral Therapywas utilized by the clinician during today's follow up session. Clinician met with patient to identify needs related to stressors and functioning, and assess and monitor for signs and symptoms of anxiety and depression, and assess safety. The clinician processed with the patient how they have been doing since the last follow-up session. Clinician used reflective listening and empathy to build a positive rapport.Clinician completed a risk assessment to determine level of severity when it comes to patient's suicidal thoughts.Clinician determined that the patient is competent, thinking clearly, orientated x 4, able to make own medical decisions, and is not acutely suicidal but if she were, she would access emergency crisis services.     Standardized Assessments completed: GAD-7 and PHQ 9  GAD-7=  18 PHQ-9= 17  Patient and/or Family Response: The patient was agreeable to the safety plan and agreed that if her suicidal thoughts worsen or do not resolve she will tell her brother Laura Small,  go to the nearest emergency department or call 911.   Assessment: Patient currently experiencing see above.   Patient may benefit from see above.  Plan: Follow up with behavioral health clinician on : 06/25/2021 at 4:00 PM  Behavioral recommendations:  Referral(s): [place] (In Clinic) "From scale of 1-10, how likely are you to follow plan?":   Laura Small  Laura Small, LCSWA

## 2021-06-04 ENCOUNTER — Other Ambulatory Visit: Payer: Self-pay

## 2021-06-04 MED ORDER — RIGHTEST GS550 BLOOD GLUCOSE VI STRP
ORAL_STRIP | 11 refills | Status: DC
  Filled 2021-06-04: qty 100, 33d supply, fill #0

## 2021-06-04 MED ORDER — RIGHTEST GM550 BLOOD GLUCOSE W/DEVICE KIT
PACK | 0 refills | Status: DC
  Filled 2021-06-04: qty 1, 30d supply, fill #0

## 2021-06-04 MED ORDER — RIGHTEST GL300 LANCETS MISC
11 refills | Status: DC
  Filled 2021-06-04: qty 100, 33d supply, fill #0

## 2021-06-11 ENCOUNTER — Telehealth: Payer: Self-pay | Admitting: Licensed Clinical Social Worker

## 2021-06-13 ENCOUNTER — Ambulatory Visit: Payer: Self-pay | Admitting: Licensed Clinical Social Worker

## 2021-06-19 ENCOUNTER — Ambulatory Visit: Payer: Self-pay | Admitting: Licensed Clinical Social Worker

## 2021-06-20 NOTE — Telephone Encounter (Signed)
Voicemail says new number. Client needs to be rescheduled to next Thursday 9/29 due to flu clinic this Thursday 9/22. UPDATE: Client was rescheduled.

## 2021-06-25 ENCOUNTER — Telehealth: Payer: Self-pay | Admitting: Licensed Clinical Social Worker

## 2021-06-25 ENCOUNTER — Ambulatory Visit: Payer: Self-pay | Admitting: Licensed Clinical Social Worker

## 2021-06-25 ENCOUNTER — Other Ambulatory Visit: Payer: Self-pay

## 2021-06-25 DIAGNOSIS — F411 Generalized anxiety disorder: Secondary | ICD-10-CM

## 2021-06-25 DIAGNOSIS — F331 Major depressive disorder, recurrent, moderate: Secondary | ICD-10-CM

## 2021-06-25 DIAGNOSIS — F4310 Post-traumatic stress disorder, unspecified: Secondary | ICD-10-CM

## 2021-06-25 NOTE — Telephone Encounter (Signed)
Called the patient to inform her appointment on 06/25/2021 at 4:00 PM will be a telephone visit. No answer left voicemail with clinic contact information

## 2021-06-25 NOTE — Telephone Encounter (Signed)
Called the patient to inform her appointment on 06/25/2021 at 4:00 PM will be a telephone visit. No answer left voicemail with clinic contact information 

## 2021-06-25 NOTE — BH Specialist Note (Signed)
Integrated Behavioral Health Follow Up In-Person Visit  MRN: [id number] Name: Laura Small  Total time: 60 minutes  Types of Service: Family psychotherapy  Interpretor:No. Interpretor Name and Language: N/A  Subjective: Laura Small is a 60 y.o. female accompanied by  herself Patient was referred by [name], NP for mental health. Patient reports the following symptoms/concerns: The patient reports that she has been doing better since her last follow-up appointment. She noted that she has been thinking about starting a foundation to help an Programmer, systems in [place]. She shared she has spoken to someone who runs a shelter there and they rescue hundreds of dogs with a staff of only three people. 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 She discussed financial and health related stressors. 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 Laura Small apologized and stated, " I am sorry, I did not mean I would kill her I was mad. I do not have a plan to kill her or anyone, but if she touches me I will call 911 and press charges." The patient discussed marriage related stressors that are impacting her life. She noted she would like a referral to other agencies that have resources to assist her if she decides to leave. Laura Small shared that she feels her brother gave her his blessing to end her life if her pain is not resolved. Laura Small stated that she is not at that point currently and wants to continue to look for help with her chronic pain levels. She stated she would like to go to [facility] neurology, but not [facility]. Laura Small endorsed suicidal thoughts, but denied any plan or  access to means to carry out a plan. Laura Small denied access to firearms in her home. She stated that if she had any increase in her suicidal or violent thoughts shew would go to the emergency department or call 911. She shared that she tried to call [phone] but after waiting on the line they hung up on her so she will call 911 instead. Duration of problem: Years; Severity of problem: moderate  Objective: Mood: Irritable and Affect: Labile Risk of harm to self or others: Suicidal ideation Thoughts of violence towards others. Patient denied any plan to carry out her verbal threat and stated she made the threat in a moment of anger and had no intention of acting on it. Laura Small further denied acces to means to carry out her treat. Clinician notified the patient's primary care provider that the patient made a verbal threat towards her. Further, clinician completed a risk assessment to determine level of severity when it comes to patient's suicidal and homicidal thoughts. Clinician determined that the patient is competent, thinking clearly, orientated x 4, able to make own medical decisions, and is not acutely suicidal or homicidal but if she were, she would access emergency crisis services.   Life Context: Family and Social: see above School/Work: see above Self-Care: see above Life Changes: see above  Patient and/or Family's Strengths/Protective Factors: Concrete supports in place (healthy food, safe environments, etc.) and Sense of purpose  Goals Addressed: Patient will:  Reduce symptoms of: agitation, anxiety, depression, insomnia, mood instability, and stress  Increase knowledge and/or ability of: coping skills, healthy habits, self-management skills, and stress reduction   Demonstrate ability to: Increase healthy adjustment to current life circumstances and Increase adequate support systems for patient/family  Progress towards Goals: Ongoing  Interventions: Interventions utilized:  CBT  Cognitive Behavioral Therapy and Link to [employer] utilized by the clinician during today's follow up session. Clinician met with patient to identify needs related to stressors and functioning, and assess and monitor for signs and symptoms of anxiety and depression, and assess safety. The clinician processed with the patient how they have been doing since the last follow-up session. Clinician provided a safe judgment free space for the patient to ventilate her frustrations towards her current life circumstances.Clinician explored negative thoughts with the patient and worked to challenge and re-frame those thoughts. Clinician completed a risk assessment to determine level of severity when it comes to patient's suicidal and homicidal thoughts. Clinician determined that the patient is competent, thinking clearly, orientated x 4, able to make own medical decisions, and is not acutely suicidal or homicidal but if she were, she would access emergency crisis services.  Clinician provided the patient with the contact information for the [facility]. Standardized Assessments completed:  ASQ suicide screening, GAD-7, and PHQ 9 GAD 7 = 18 PHQ-9 = 14 Assessment: Patient currently experiencing see above.   Patient may benefit from see above.  Plan: Follow up with behavioral health clinician on : 07/04/2021 at 4:00 PM  Behavioral recommendations:  Referral(s): [place] (In Clinic) "From scale of 1-10, how likely are you to follow plan?":   Lesli Albee, LCSWA

## 2021-07-01 ENCOUNTER — Other Ambulatory Visit: Payer: Self-pay

## 2021-07-01 ENCOUNTER — Ambulatory Visit: Payer: Self-pay

## 2021-07-01 DIAGNOSIS — Z79899 Other long term (current) drug therapy: Secondary | ICD-10-CM

## 2021-07-01 NOTE — Progress Notes (Signed)
Medication Management Clinic Visit Note  Patient: Laura Small MRN: [id number] Date of Birth: Mar 21, 1961 PCP: Rolm Gala, NP   Larina Earthly 60 y.o. female presents for a MTM visit today. Identified by name and date of birth.  There were no vitals taken for this visit.  Patient Information   Past Medical History:  Diagnosis Date   Back pain    Chicken pox    Diabetes mellitus without complication (HCC)    DJD (degenerative joint disease)    Migraines    PONV (postoperative nausea and vomiting)    usually has 1 episode of vomiting within 1 hour of anesthesia       Past Surgical History:  Procedure Laterality Date   BACK SURGERY     CHOLECYSTECTOMY     INSERTION OF MESH N/A 02/12/2015   Procedure: INSERTION OF MESH;  Surgeon: Natale Lay, MD;  Location: [facility] ORS;  Service: General;  Laterality: N/A;   VENTRAL HERNIA REPAIR N/A 02/12/2015   Procedure: HERNIA REPAIR VENTRAL ADULT;  Surgeon: Natale Lay, MD;  Location: [facility] ORS;  Service: General;  Laterality: N/A;     Family History  Problem Relation Age of Onset   Diabetes Mother    Gallbladder disease Mother    Heart disease Father    Alcohol abuse Father    Gallbladder disease Father    Heart disease Paternal Grandmother    Colon cancer Neg Hx     Family Support: Good. Brother assists in patient's care.  Lifestyle Diet: Breakfast: sandwich, peanut butter, sausage biscuit Lunch: peanut butter, tuna, cheese Dinner: grilled fish Drinks: soda, water           Social History   Substance and Sexual Activity  Alcohol Use No   Comment: reports alcohol abuse in teenage years      Social History   Tobacco Use  Smoking Status Every Day   Packs/day: 0.05   Types: Cigarettes  Smokeless Tobacco Never  Tobacco Comments   pt reports being in the process of quitting and is down to 1 cigarette a day      Health Maintenance  Topic Date Due   COVID-19 Vaccine (1) Never done    OPHTHALMOLOGY EXAM  Never done   HIV Screening  Never done   Hepatitis C Screening  Never done   Zoster Vaccines- Shingrix (1 of 2) Never done   PAP SMEAR-Modifier  Never done   COLONOSCOPY (Pts 45-21yrs Insurance coverage will need to be confirmed)  Never done   MAMMOGRAM  Never done   URINE MICROALBUMIN  08/22/2021   HEMOGLOBIN A1C  10/04/2021   FOOT EXAM  04/16/2022   TETANUS/TDAP  10/21/2024   HPV VACCINES  Aged Out   INFLUENZA VACCINE  Discontinued   Health Maintenance/Date Completed  Last ED visit: 2021  Last Visit to PCP: July 2022 Next Visit to PCP: October 8th at [facility]-Endocrinology  Eye Exam: Wears eye glasses. 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 Checks blood glucose four times daily (FBG 159-243, mealtimes in the 300s).  Recommended patient and family bring blood glucose log and meal log to appointment next week. Educated on insulin response of basal and prandial insulin. Educated on meal and beverage optimization.  Patient and family report redness and generalized overheating after Humalog  injection as well as nausea.  LDL 206 in July 2022. Patient wishes to continue diet and exercise based therapy. Educated on meal and beverage optimization. Unable to exercise as below.   Pain: Reported history of muscle spasms and pain. Patient wishes to gain control of pain to aid in control of diabetes.  Health maintenance: Applied for [facility] charity care. Awaiting result. Upcoming appointment at [facility] with Dr. Almond Lint. Advised family to join the visit and bring a list of items to discuss.    RTC: 1 year  Jaynie Bream, PharmD Pharmacy Resident  07/01/2021 4:18 PM

## 2021-07-04 ENCOUNTER — Ambulatory Visit: Payer: Self-pay | Admitting: Licensed Clinical Social Worker

## 2021-07-04 ENCOUNTER — Other Ambulatory Visit: Payer: Self-pay

## 2021-07-04 ENCOUNTER — Telehealth: Payer: Self-pay | Admitting: Licensed Clinical Social Worker

## 2021-07-04 DIAGNOSIS — F411 Generalized anxiety disorder: Secondary | ICD-10-CM

## 2021-07-04 DIAGNOSIS — F4310 Post-traumatic stress disorder, unspecified: Secondary | ICD-10-CM

## 2021-07-04 DIAGNOSIS — F331 Major depressive disorder, recurrent, moderate: Secondary | ICD-10-CM

## 2021-07-04 NOTE — Telephone Encounter (Signed)
I called patient 3 times and each time there was a voice message saying that the person has a number and to leave a message. I left a voicemail 1 time.

## 2021-07-04 NOTE — BH Specialist Note (Signed)
Integrated Behavioral Health Follow Up In-Person Visit  MRN: [id number] Name: Laura Small [facility]   Total time: 60 minutes  Types of Service: Individual psychotherapy  Interpretor:No. Interpretor Name and Language: N/A  Subjective: Laura Small is a 60 y.o. female accompanied by  herself Patient was referred by Laura Shadow, Np for mental health. Patient reports the following symptoms/concerns: The patient reports that she has been doing the same since her last follow-up appointment. She shared that she would like to complete her biopsychosocial assessment and any other assessment that may be beneficial is figuring out what is going on with her. She shared that she is ready to take steps towards changing the way things are currently. She explained that her pain has increased this week, and described that she feels she needs a surgery for Piriformis syndrome to relieve nerve compression. She explained that she is weary from the chronic pain and stressors in her life. She shared that she would bring her brother Laura Small with her to her next appointment. Laura Small discussed previous trauma history. She noted that she needed the referral to family Justice again. She discussed marital stressors impacting her life. Laura Small endorsed suicidal thoughts but denied any plan or access to means to carry out a plan. Laura Small denied access to firearms in her home. Laura Small stated she would tell her brother Laura Small if her suicidal thoughts worsened and she would call 911 or go to the nearest emergency department. The patient denied any homicidal thoughts. Duration of problem: Years; Severity of problem: moderate  Objective: Mood: Angry and Affect: Appropriate Risk of harm to self or others: Suicidal ideation  Life Context: Family and Social: see above School/Work: see above Self-Care: see above Life Changes: see above  Patient and/or Family's Strengths/Protective Factors: Concrete supports in  place (healthy food, safe environments, etc.)  Goals Addressed: Patient will:  Reduce symptoms of: agitation, anxiety, depression, insomnia, mood instability, and stress   Increase knowledge and/or ability of: coping skills, healthy habits, self-management skills, and stress reduction   Demonstrate ability to: Increase healthy adjustment to current life circumstances, Increase adequate support systems for patient/family, and Increase motivation to adhere to plan of care  Progress towards Goals: Ongoing  Interventions: Interventions utilized:  CBT Cognitive Behavioral Therapywas utilized by the clinician during today's follow up session. Clinician met with patient to identify needs related to stressors and functioning, and assess and monitor for signs and symptoms of anxiety and depression, and assess safety. The clinician processed with the patient how they have been doing since the last follow-up session.  Clinician provided a safe judgement free space for the client to ventilate her frustrations regarding her current life circumstances. Clinician completed a risk assessment to determine level of severity when it comes to patient's suicidal thoughts. Clinician determined that the patient is competent, thinking clearly, orientated x 4, able to make own medical decisions, and is not acutely suicidal but if she were, she would access emergency crisis services. Clinician reviewed the safety plan with the patient and the patient was agreeable to the safety plan. The clinician encouraged the patient to utilize their coping skills to deal with their current life circumstances and to incorporate self care into her daily routine.   Standardized Assessments completed: GAD-7 and PHQ 9  GAD-7 = 16 PHQ-9 =  9  Assessment: Patient currently experiencing see above.   Patient may benefit from see above.  Plan: Follow up with behavioral health clinician on : 07/16/2021 at 2:00 PM  Behavioral  recommendations:   Referral(s): [place] (In Clinic) "From scale of 1-10, how likely are you to follow plan?":   Laura Small, LCSWA

## 2021-07-09 ENCOUNTER — Ambulatory Visit: Payer: Self-pay

## 2021-07-16 ENCOUNTER — Telehealth: Payer: Self-pay | Admitting: Licensed Clinical Social Worker

## 2021-07-16 ENCOUNTER — Ambulatory Visit: Payer: Self-pay | Admitting: Licensed Clinical Social Worker

## 2021-07-16 ENCOUNTER — Other Ambulatory Visit: Payer: Self-pay

## 2021-07-16 DIAGNOSIS — F331 Major depressive disorder, recurrent, moderate: Secondary | ICD-10-CM

## 2021-07-16 DIAGNOSIS — F4312 Post-traumatic stress disorder, chronic: Secondary | ICD-10-CM

## 2021-07-16 DIAGNOSIS — F411 Generalized anxiety disorder: Secondary | ICD-10-CM

## 2021-07-16 NOTE — BH Specialist Note (Signed)
Integrated Behavioral Health Follow Up In-Person Visit  MRN: [id number] Name: [facility]   Total time: 60 minutes  Types of Service: Individual psychotherapy  Interpretor:No. Interpretor Name and Language: N/A  Subjective: Laura Small is a 60 y.o. female accompanied by  Her brother [name] Patient was referred by [name], NP for mental Health. Patient reports the following symptoms/concerns: The patient reports that she has been doing the same since her last follow-up appointment. Laura Small brought her younger brother [name] with her today for support. Laura Small discussed her trauma history that lead to her first psychiatric hospitalization. She shared her childhood experiences and her brother, [name], offered his perspective on their shared experiences. Laura Small noted that she grew up isolated from others and always had to walk on glass to not upset her father. Laura Small explained that she feels her value comes from her independence and her ability to support herself and care for others.Laura Small discussed an out of state friend and their offer to assist her. She discussed family stressors that are impacting her life currently.  The patient denied any suicidal or homicidal thoughts.  Duration of problem: Years; Severity of problem: moderate  Objective: Mood: Depressed and Affect: Appropriate Risk of harm to self or others: No plan to harm self or others  Life Context: Family and Social: see above School/Work: see above Self-Care: see above Life Changes: see above  Patient and/or Family's Strengths/Protective Factors: Concrete supports in place (healthy food, safe environments, etc.)  Goals Addressed: Patient will:  Reduce symptoms of: agitation, anxiety, depression, insomnia, mood instability, and stress   Increase knowledge and/or ability of: coping skills, healthy habits, self-management skills, and stress reduction   Demonstrate ability to: Increase healthy  adjustment to current life circumstances and Increase adequate support systems for patient/family  Progress towards Goals: Ongoing  Interventions: Interventions utilized:  CBT Cognitive Behavioral Therapywas utilized by the clinician during today's follow up session. Clinician met with patient to identify needs related to stressors and functioning, and assess and monitor for signs and symptoms of anxiety and depression, and assess safety. The clinician processed with the patient how they have been doing since the last follow-up session. Clinician provided a safe judgement free space for the patient to ventilate her frustrations regarding her current life circumstances. Clinician encouraged the client to consider applying for disability. Clinician explored the patient's trauma history with the patient and utilized CBT to help the patient identify, challenge, and re -frame negative thoughts. Clinician encouraged the patient to utilize their coping skills to deal with their current life circumstances.   Standardized Assessments completed: GAD-7 and PHQ 9 GAD-7=   20 PHQ-9 =  17  Assessment: Patient currently experiencing see above.   Patient may benefit from see above.  Plan: Follow up with behavioral health clinician on : 07/30/2021 st 2:00 PM  Behavioral recommendations:  Referral(s): [place] (In Clinic) "From scale of 1-10, how likely are you to follow plan?":   Lesli Albee, LCSWA

## 2021-07-17 NOTE — Telephone Encounter (Signed)
Called patient to provide a reminder to invite her brother for her next [facility] appointment

## 2021-07-24 ENCOUNTER — Other Ambulatory Visit: Payer: Self-pay

## 2021-07-24 ENCOUNTER — Ambulatory Visit: Payer: Self-pay | Admitting: Internal Medicine

## 2021-07-24 ENCOUNTER — Encounter: Payer: Self-pay | Admitting: Internal Medicine

## 2021-07-24 DIAGNOSIS — F331 Major depressive disorder, recurrent, moderate: Secondary | ICD-10-CM

## 2021-07-24 DIAGNOSIS — M79671 Pain in right foot: Secondary | ICD-10-CM | POA: Insufficient documentation

## 2021-07-24 DIAGNOSIS — Z794 Long term (current) use of insulin: Secondary | ICD-10-CM

## 2021-07-24 NOTE — Progress Notes (Signed)
New Patient Office Visit  Subjective:  Patient ID: Laura Small, female    DOB: 05-Jul-1961  Age: 60 y.o. MRN: [id number]  CC:  Chief Complaint  Patient presents with   Establish Care    Changing provider from Tonia Ghent, NP to Dr Candelaria Stagers   Depression    Currently working with Herbert Seta.    HPI Laura Small is a 60 year old woman with a history of diabetes, back pain, migraines, and degenerative joint disease presents to establish care with Dr. Candelaria Stagers.  She complains of bilateral foot pain that she describes as "walking on blisters on sharp rocks." She describes it as a burning/stinging/stabbing that spreads across the sole of both feet. She feels these are neuromas. She reports no discoloration in her feet and can occasionally "see them rising out of" her soles. She states these areas feel hot to the touch and hot on the inside. She will occasionally mash them and states it "feels like she squished things out." She cannot handle the weight of covers on her feet at night. She checks her blood glucose daily, stating that her levels have been going up, no matter what she does. Her fasting morning readings ranged from 200-243 for the past week. She takes basaglar first thing in the morning. She started the humulog after meals as directed by endocrinology, but states it makes her feel nauseated within 15 minutes.  She is still dealing with ongoing depression, is still working with [employer]. She states she is still trying, denies suicidal thoughts or intentions. Other than this, she offers no additional complaints today.   Past Medical History:  Diagnosis Date   Back pain    Chicken pox    Diabetes mellitus without complication (HCC)    DJD (degenerative joint disease)    Migraines    PONV (postoperative nausea and vomiting)    usually has 1 episode of vomiting within 1 hour of anesthesia     Past Surgical History:  Procedure Laterality Date   BACK SURGERY      CHOLECYSTECTOMY     2011   INSERTION OF MESH N/A 02/12/2015   Procedure: INSERTION OF MESH;  Surgeon: Natale Lay, MD;  Location: [facility] ORS;  Service: General;  Laterality: N/A;   VENTRAL HERNIA REPAIR N/A 02/12/2015   Procedure: HERNIA REPAIR VENTRAL ADULT;  Surgeon: Natale Lay, MD;  Location: [facility] ORS;  Service: General;  Laterality: N/A;    Family History  Problem Relation Age of Onset   Diabetes Mother    Gallbladder disease Mother    Heart disease Father    Alcohol abuse Father    Gallbladder disease Father    Gallbladder disease Brother    Diabetes Mellitus II Maternal Grandmother    Diabetes Mellitus II Maternal Grandfather    Colon cancer Neg Hx     Social History   Socioeconomic History   Marital status: Divorced    Spouse name: Not on file   Number of children: Not on file   Years of education: Not on file   Highest education level: Not on file  Occupational History   Not on file  Tobacco Use   Smoking status: Every Day    Packs/day: 0.05    Types: Cigarettes   Smokeless tobacco: Never   Tobacco comments:    pt reports being in the process of quitting and is down to 1 cigarette a day  Substance and Sexual Activity   Alcohol use: No  Comment: reports alcohol abuse in teenage years   Drug use: No   Sexual activity: Not Currently    Partners: Male  Other Topics Concern   Not on file  Social History Narrative   Lives with husband   Social Determinants of Health   Financial Resource Strain: Not on file  Food Insecurity: Not on file  Transportation Needs: Not on file  Physical Activity: Not on file  Stress: Not on file  Social Connections: Not on file  Intimate Partner Violence: Not on file    ROS Review of Systems  Objective:   Today's Vitals: There were no vitals taken for this visit.  Physical Exam  Assessment & Plan:   1. Type 2 diabetes mellitus with hyperglycemia, with long-term current use of insulin (HCC) Continue basaglar and  humulog as directed by [facility]ocrinologists. Please check and write down your blood sugars once daily, but alternating the times that you check them for the next 2 weeks: 1 day before breakfast 1 day before lunch 1 day before supper 1 day before bedtime  2. Bilateral foot pain Continue diabetic medication as prescribed by endocrinology. Will assess in person at next visit.   3. Moderate episode of recurrent major depressive disorder (HCC) Continue to work with Herbert Seta, can call [phone] for crisis. Go to the ED for any suicidal ideation.     Follow-up: Return in about 2 weeks (around 08/07/2021).   Micael Hampshire, RN

## 2021-07-24 NOTE — Patient Instructions (Addendum)
Please come in 07/31/21 for blood work. Please come in 08/07/21 at 11:00 am for follow up in person with Dr. Candelaria Stagers.  Please check and write down your blood sugars once daily, but alternating the times that you check them for the next 2 weeks: 1 day before breakfast 1 day before lunch 1 day before supper 1 day before bedtime

## 2021-07-30 ENCOUNTER — Other Ambulatory Visit: Payer: Self-pay

## 2021-07-30 ENCOUNTER — Ambulatory Visit: Payer: Self-pay | Admitting: Licensed Clinical Social Worker

## 2021-07-30 DIAGNOSIS — F4310 Post-traumatic stress disorder, unspecified: Secondary | ICD-10-CM

## 2021-07-30 DIAGNOSIS — F411 Generalized anxiety disorder: Secondary | ICD-10-CM

## 2021-07-30 DIAGNOSIS — F331 Major depressive disorder, recurrent, moderate: Secondary | ICD-10-CM

## 2021-07-30 NOTE — BH Specialist Note (Signed)
Integrated Behavioral Health Follow Up In-Person Visit  MRN: [id number] Name: Laura Small [facility]   Total time: 60 minutes  Types of Service: Individual psychotherapy  Interpretor:No. Interpretor Name and Language: N/A  Subjective: Laura Small is a 60 y.o. female accompanied by Sibling, Laura Small, who provided collateral information during today's session. Patient was referred by [name], NP for Mental Health. Patient reports the following symptoms/concerns: The patient stated that she has been doing great since her last follow up visit. Alle shared that she went to the [facility] emergency department and the doctors and nurses were all kind and caring; unlike the ones here. She shared that the ED Doctor used an ultrasound and was able to see the neuromas in her feet and now she has images to prove her case. Laura Small stated that "the doctor showed [place] the images; I was right because I had told her it was Piriformis syndrome and told her that her pain was coming from neuromas." He explained that he is in the psychology program at [facility] and noted that Laura Small started the program at the same time he did and was "kicked out due to her thinking, asked to change her opinions, and when she didn't she was kicked out." Laura Small noted that her political views were not well received and feels she was discriminated against. Laura Small shared he has been researching her options and gathering her information and wanted to know if the clinician would testify in court on her behalf when she sued her current providers. She noted it was her primary care providers fault that she was in the wheel chair and unable to walk. She explained that she now has hope and because following her upcoming podiatry appointment the doctor will follow her nerve pain up her legs and discover she is right and schedule her surgery to correct her problems. The patient denied any homicidal or suicidal  thoughts.  Duration of problem: Years; Severity of problem: moderate  Objective: Mood: Angry and Affect: Labile Risk of harm to self or others: No plan to harm self or others  Life Context: Family and Social: see above School/Work: see above Self-Care: see above Life Changes: see above  Patient and/or Family's Strengths/Protective Factors: Social connections and Concrete supports in place (healthy food, safe environments, etc.)  Goals Addressed: Patient will:  Reduce symptoms of: agitation, anxiety, depression, insomnia, mood instability, and stress   Increase knowledge and/or ability of: coping skills, healthy habits, self-management skills, and stress reduction   Demonstrate ability to: Increase healthy adjustment to current life circumstances and Increase adequate support systems for patient/family  Progress towards Goals: Ongoing  Interventions: Interventions utilized:  DBT Dialectal Behavioral Therapy was utilized by the clinician during today's follow up session. Clinician met with patient to identify needs related to stressors and functioning, and assess and monitor for signs and symptoms of anxiety and depression, and assess safety. The clinician processed with the patient how they have been doing since the last follow-up session. Clinician continued to introduce distress tolerance skills to the patient and reviewed that negative emotions will usually lessen in intensity and pass over time.Clinician suggested the patient make a "hope box" and fill it with positive things such as a picture of her brother, an activity book, or a note with a joke she found funny to look through when she is feeling overwhelmed or down. Clinician reviewed the importance of self care and encouraged the client to use her coping skills to deal with her  current life circumstances. Clinician reviewed with both the patient and her brother that if she should experience a mental health crisis she could call or  text 15, call 911 or go to the nearest emergency department, or use the [facility] crisis walk in. Session ended with scheduling. Standardized Assessments completed: GAD-7 and PHQ 9 PHQ-9 =  11 GAD-7 =  16   Assessment: Patient currently experiencing see above.   Patient may benefit from see above.  Plan: Follow up with behavioral health clinician on : 08/27/2021 at 2:00 PM  Behavioral recommendations:  Referral(s): [place] (In Clinic) "From scale of 1-10, how likely are you to follow plan?":   Laura Small, LCSWA

## 2021-07-31 ENCOUNTER — Other Ambulatory Visit: Payer: Self-pay

## 2021-07-31 DIAGNOSIS — E1165 Type 2 diabetes mellitus with hyperglycemia: Secondary | ICD-10-CM

## 2021-08-01 LAB — LIPID PANEL
Chol/HDL Ratio: 11 ratio — ABNORMAL HIGH (ref 0.0–4.4)
Cholesterol, Total: 265 mg/dL — ABNORMAL HIGH (ref 100–199)
HDL: 24 mg/dL — ABNORMAL LOW (ref >39)
LDL Chol Calc (NIH): 202 mg/dL — ABNORMAL HIGH (ref 0–99)
Triglycerides: 201 mg/dL — ABNORMAL HIGH (ref 0–149)
VLDL Cholesterol Cal: 39 mg/dL (ref 5–40)

## 2021-08-01 LAB — CBC WITH DIFFERENTIAL/PLATELET
Basophils Absolute: 0.1 10*3/uL (ref 0.0–0.2)
Basos: 1 %
EOS (ABSOLUTE): 0 10*3/uL (ref 0.0–0.4)
Eos: 0 %
Hematocrit: 45.2 % (ref 34.0–46.6)
Hemoglobin: 15.4 g/dL (ref 11.1–15.9)
Immature Grans (Abs): 0 10*3/uL (ref 0.0–0.1)
Immature Granulocytes: 0 %
Lymphocytes Absolute: 3.9 10*3/uL — ABNORMAL HIGH (ref 0.7–3.1)
Lymphs: 40 %
MCH: 28.9 pg (ref 26.6–33.0)
MCHC: 34.1 g/dL (ref 31.5–35.7)
MCV: 85 fL (ref 79–97)
Monocytes Absolute: 0.7 10*3/uL (ref 0.1–0.9)
Monocytes: 8 %
Neutrophils Absolute: 5 10*3/uL (ref 1.4–7.0)
Neutrophils: 51 %
Platelets: 319 10*3/uL (ref 150–450)
RBC: 5.33 x10E6/uL — ABNORMAL HIGH (ref 3.77–5.28)
RDW: 13 % (ref 11.7–15.4)
WBC: 9.8 10*3/uL (ref 3.4–10.8)

## 2021-08-01 LAB — COMPREHENSIVE METABOLIC PANEL
ALT: 14 IU/L (ref 0–32)
AST: 11 IU/L (ref 0–40)
Albumin/Globulin Ratio: 1.7 (ref 1.2–2.2)
Albumin: 4 g/dL (ref 3.8–4.9)
Alkaline Phosphatase: 174 IU/L — ABNORMAL HIGH (ref 44–121)
BUN/Creatinine Ratio: 8 — ABNORMAL LOW (ref 9–23)
BUN: 5 mg/dL — ABNORMAL LOW (ref 6–24)
Bilirubin Total: 0.5 mg/dL (ref 0.0–1.2)
CO2: 22 mmol/L (ref 20–29)
Calcium: 9 mg/dL (ref 8.7–10.2)
Chloride: 97 mmol/L (ref 96–106)
Creatinine, Ser: 0.66 mg/dL (ref 0.57–1.00)
Globulin, Total: 2.4 g/dL (ref 1.5–4.5)
Glucose: 415 mg/dL — ABNORMAL HIGH (ref 70–99)
Potassium: 3.2 mmol/L — ABNORMAL LOW (ref 3.5–5.2)
Sodium: 136 mmol/L (ref 134–144)
Total Protein: 6.4 g/dL (ref 6.0–8.5)
eGFR: 102 mL/min/{1.73_m2} (ref >59)

## 2021-08-01 LAB — HEMOGLOBIN A1C
Est. average glucose Bld gHb Est-mCnc: 315 mg/dL
Hgb A1c MFr Bld: 12.6 % — ABNORMAL HIGH (ref 4.8–5.6)

## 2021-08-01 LAB — TSH: TSH: 6.11 u[IU]/mL — ABNORMAL HIGH (ref 0.450–4.500)

## 2021-08-06 ENCOUNTER — Encounter: Payer: Self-pay | Admitting: Internal Medicine

## 2021-08-06 ENCOUNTER — Ambulatory Visit: Payer: Self-pay | Admitting: Internal Medicine

## 2021-08-06 VITALS — BP 120/79 | HR 90 | Temp 98.1°F

## 2021-08-06 DIAGNOSIS — E1165 Type 2 diabetes mellitus with hyperglycemia: Secondary | ICD-10-CM

## 2021-08-06 MED ORDER — TRULICITY 0.75 MG/0.5ML ~~LOC~~ SOPN
0.7500 mg | PEN_INJECTOR | SUBCUTANEOUS | 0 refills | Status: AC
Start: 2021-08-06 — End: 2021-09-05
  Filled 2021-08-06: qty 2, 28d supply, fill #0

## 2021-08-06 MED ORDER — TRULICITY 0.75 MG/0.5ML ~~LOC~~ SOAJ
1.5000 mg | SUBCUTANEOUS | 1 refills | Status: DC
  Filled 2021-08-06: qty 6, 84d supply, fill #0
  Filled 2021-09-06: qty 4, 28d supply, fill #0
  Filled 2021-09-06: qty 6, 84d supply, fill #0
  Filled 2021-10-02: qty 4, 28d supply, fill #1

## 2021-08-06 NOTE — Patient Instructions (Addendum)
It was so nice to see you today!  To help with decreasing your insulin burden and assist in your diabetes management, you will be starting Trulicity at 0.75mg  weekly for 4-6 weeks. Increase to 1.5mg  weekly afterwards. For side effects, you may experience a loss of appetite and nausea. Visit Emergency Room if abdominal pain occurs and you cannot tolerate any oral intake. Continue Basaglar 50 units. We also recommend to increase your Basaglar 2 units every 3 days, if having blood sugars consistently over 180 mg/dl in the morning, to a maximum of 70 units. Continue Humalog 4 units, 3 times a day Continue glucose checks We will see you in three months for a follow up appointment.

## 2021-08-06 NOTE — Progress Notes (Signed)
Follow up Diabetes/ Endocrine [facility]     Patient ID: Laura Small, female   DOB: 05-05-1961, 61 y.o.   MRN: [id number] Assessment/Plan  Laura Small is a 60 y.o. female who is seen in follow up for Type 2 diabetes mellitus with hyperglycemia, with long-term current use of insulin ([place]) [E11.65, Z79.4] at the request of Langston Reusing, NP. Laura Small has an above target HbA1c of 12.6 on 11/9 that is increased from 11.6 on 7/13 with high fasting blood sugar readings up to 500. She has not been able to tolerate Humalog but has been taking Basaglar consistently.   To further optimize her diabetes care regimen and increase her treatment response, we sought to add Trulicity onto her currect regimen, starting at 0.75mg  weekly for 4-6 weeks as tolerated and increasing to 1.5mg  afterwards. Side effects should be monitored, such as loss of appetite, nausea, and visiting the ER if abdominal pain occurs that prevents oral intake. Her Basaglar will be increased 2 units per day if her blood sugars are above 180 mg/dl to a maximum of 70 units. She will continue the humalog 4 units 3 times a day as tolerated in context of her symptoms of a headache and nausea.   With consistently high A1c and blood sugars, other endocrinopathies were investigated. For Cushing's disease, no physical signs (purple striae, moon faces, facial plethora) were detected. Latent autoimmune diabetes in adults (LADA) should be considered as she developed diabetes symptoms after taking prednisone in 2018 so if she does not respond to Trulicity, consider checking a C-peptide and anti-GAD antibody.  She will be followed up at [facility] after three months.    Patient Instructions  It was so nice to see you today!  To help with decreasing your insulin burden and assist in your diabetes management, you will be starting Trulicity at 0.75mg  weekly for 4-6 weeks. Increase to 1.5mg  weekly afterwards. For side effects, you  may experience a loss of appetite and nausea. Visit Emergency Room if abdominal pain occurs and you cannot tolerate any oral intake. Continue Basaglar 50 units. We also recommend to increase your Basaglar 2 units every 3 days, if having blood sugars consistently over 180 mg/dl in the morning, to a maximum of 70 units. Continue Humalog 4 units, 3 times a day Continue glucose checks We will see you in three months for a follow up appointment.   Orders: Trulicity 0.75mg   Trulicity 1.5mg      Subjective:  Laura Small is a 60 year old female with T2DM diagnosed in 2018 who is presenting for a diabetes care follow up with a HbA1c of 12.6 on 11/9 from 11.7 on 7/13. After her last visit here in July, her glucose was 197 mg/dl at baseline and has been increasing to 500 fasting today. With activity, she states her sugars drop to 200 and upper 100s. After taking Basaglar in the morning, her sugar dropped to 250 mg/dl. She takes Basaglar 50 units in the mornings consistently. She has increased it since her last visit by increments of 2. She has been taking 50 units for about a month. She is concerned that her glucose is still increasing. She has been taking Humalog 3 days out of the week, 4 units three times a day. After checking her blood sugar 30 minutes afterwards, her sugars were closer to 500. She does not have an appetite and has tried taking humalog at different schedules, but this did not help. She also reports headaches  with light and sound sensitivity that lasts half a day. She reports that when she drinks any water, her numbers increase.  She denies any symptoms of increased thirst or increased urination. She does not have any shaking/chills, feelings of passing out, chest pain or shortness of breath. She gained weight from 195 to 225lbs. Her vision goes in and out, sometimes get better or worse.  She has not been able to walk with the pain she is experiencing and has expressed frustration after a  recent appointment to investigate neuromas at [facility] where the provider "switched diagnosis".  She has no history or family history of pancreatitis or medullary thyroid cancer. She has been seeing a therapist and has been motivated to work on things that are meaningful to her. She has passive thoughts of "ending things" due to the pain but is motivated to improve her health and keep working on herself.  Her diet consists of three meals a day, that is similar to a normal sized meal spread out in three. Her brother cooks lean meat with veggies and grills them withouta lot of sugar or salt. She does smoke but does not state the amount. No alcohol use and no other drug use.  For other medications, she has not been taking magnesium or B vitamins. She uses aspirin 35m 3 times daily for headaches. No magnesium, no b vitamins   Review of Systems  Respiratory:  Negative for shortness of breath.   Cardiovascular:  Negative for chest pain.   SOliver Hum has a past medical history of Back pain, Chicken pox, Diabetes mellitus without complication ([place], DJD (degenerative joint disease), Migraines, and PONV (postoperative nausea and vomiting).  Family History, Social History, current Medications and allergies reviewed and updated in Epic.  Objective:    Vitals: Vitals:   08/06/21 1802  BP: 120/79  Pulse: 90  Temp: 98.1 F (36.7 C)  SpO2: 98%    Physical Exam Neck:     Comments: No dark patches indicative of anthracosis nigricans  Cardiovascular:     Rate and Rhythm: Normal rate and regular rhythm.     Pulses: Normal pulses.          Dorsalis pedis pulses are 2+ on the right side and 2+ on the left side.       Posterior tibial pulses are 2+ on the right side and 2+ on the left side.     Heart sounds: Normal heart sounds.  Pulmonary:     Effort: Pulmonary effort is normal.     Breath sounds: Normal breath sounds.  Abdominal:     Palpations: Abdomen is soft.     Tenderness: There  is no abdominal tenderness.     Comments: Non tender, non distended, no purple striae, no nodules of lipoatrophy  Feet:     Right foot:     Skin integrity: Skin integrity normal.     Toenail Condition: Right toenails are long.     Left foot:     Skin integrity: Skin integrity normal.     Toenail Condition: Left toenails are long.     Comments: Diabetic foot exam with a 10g monofilament needle showed decreased sensation on the bilateral plantar surface, bilateral toes and bilateral upper half of the dorsal surface. Intact sensation with pain on her middle and heel of her plantar surface.   Neurological:     Mental Status: She is alert.    Labs on 11/9: HbA1c: 12.6 Glucose: 415 ACR: .66 HDL:  24 TG: 201 TSH: 6.11  Recent Results (from the past 2160 hour(s))  Lipid Profile     Status: Abnormal   Collection Time: 07/31/21 10:53 AM  Result Value Ref Range   Cholesterol, Total 265 (H) 100 - 199 mg/dL   Triglycerides 201 (H) 0 - 149 mg/dL   HDL 24 (L) >39 mg/dL   VLDL Cholesterol Cal 39 5 - 40 mg/dL   LDL Chol Calc (NIH) 202 (H) 0 - 99 mg/dL   Lipid Comment: Comment     Comment: Possible Familial Hypercholesterolemia. FH should be suspected when fasting LDL cholesterol is above 189 mg/dL or non-HDL cholesterol is above 219 mg/dL. A family history of high cholesterol and heart disease in 1st degree relatives should be collected. J Clin Lipidol 2011;5:133-140    Chol/HDL Ratio 11.0 (H) 0.0 - 4.4 ratio    Comment:                                   T. Chol/HDL Ratio                                             Men  Women                               1/2 Avg.Risk  3.4    3.3                                   Avg.Risk  5.0    4.4                                2X Avg.Risk  9.6    7.1                                3X Avg.Risk 23.4   11.0   TSH     Status: Abnormal   Collection Time: 07/31/21 10:53 AM  Result Value Ref Range   TSH 6.110 (H) 0.450 - 4.500 uIU/mL  HgB A1c      Status: Abnormal   Collection Time: 07/31/21 10:53 AM  Result Value Ref Range   Hgb A1c MFr Bld 12.6 (H) 4.8 - 5.6 %    Comment:          Prediabetes: 5.7 - 6.4          Diabetes: >6.4          Glycemic control for adults with diabetes: <7.0    Est. average glucose Bld gHb Est-mCnc 315 mg/dL  CBC w/Diff     Status: Abnormal   Collection Time: 07/31/21 10:53 AM  Result Value Ref Range   WBC 9.8 3.4 - 10.8 x10E3/uL   RBC 5.33 (H) 3.77 - 5.28 x10E6/uL   Hemoglobin 15.4 11.1 - 15.9 g/dL   Hematocrit 45.2 34.0 - 46.6 %   MCV 85 79 - 97 fL   MCH 28.9 26.6 - 33.0 pg   MCHC 34.1 31.5 - 35.7 g/dL   RDW 13.0 11.7 - 15.4 %   Platelets 319 150 - 450  x10E3/uL   Neutrophils 51 Not Estab. %   Lymphs 40 Not Estab. %   Monocytes 8 Not Estab. %   Eos 0 Not Estab. %   Basos 1 Not Estab. %   Neutrophils Absolute 5.0 1.4 - 7.0 x10E3/uL   Lymphocytes Absolute 3.9 (H) 0.7 - 3.1 x10E3/uL   Monocytes Absolute 0.7 0.1 - 0.9 x10E3/uL   EOS (ABSOLUTE) 0.0 0.0 - 0.4 x10E3/uL   Basophils Absolute 0.1 0.0 - 0.2 x10E3/uL   Immature Granulocytes 0 Not Estab. %   Immature Grans (Abs) 0.0 0.0 - 0.1 x10E3/uL  Comp Met (CMET)     Status: Abnormal   Collection Time: 07/31/21 10:53 AM  Result Value Ref Range   Glucose 415 (H) 70 - 99 mg/dL   BUN 5 (L) 6 - 24 mg/dL   Creatinine, Ser 0.66 0.57 - 1.00 mg/dL   eGFR 102 >59 mL/min/1.73   BUN/Creatinine Ratio 8 (L) 9 - 23   Sodium 136 134 - 144 mmol/L   Potassium 3.2 (L) 3.5 - 5.2 mmol/L   Chloride 97 96 - 106 mmol/L   CO2 22 20 - 29 mmol/L   Calcium 9.0 8.7 - 10.2 mg/dL   Total Protein 6.4 6.0 - 8.5 g/dL   Albumin 4.0 3.8 - 4.9 g/dL   Globulin, Total 2.4 1.5 - 4.5 g/dL   Albumin/Globulin Ratio 1.7 1.2 - 2.2   Bilirubin Total 0.5 0.0 - 1.2 mg/dL   Alkaline Phosphatase 174 (H) 44 - 121 IU/L   AST 11 0 - 40 IU/L   ALT 14 0 - 32 IU/L       The 10-year ASCVD risk score (Arnett DK, et al., 2019) is: 22.6%   Values used to calculate the score:     Age: 63  years     Sex: Female     Is Non-Hispanic African American: No     Diabetic: Yes     Tobacco smoker: Yes     Systolic Blood Pressure: 182 mmHg     Is BP treated: No     HDL Cholesterol: 24 mg/dL     Total Cholesterol: 265 mg/dL   Wt Readings from Last 3 Encounters:  07/31/21 225 lb (102.1 kg)  04/03/21 221 lb 3.2 oz (100.3 kg)  11/06/20 225 lb (102.1 kg)    Data : I have personally reviewed pertinent labs and imaging studies, if indicated,  with the patient in clinic today.

## 2021-08-07 ENCOUNTER — Ambulatory Visit: Payer: Self-pay | Admitting: Internal Medicine

## 2021-08-07 ENCOUNTER — Telehealth: Payer: Self-pay | Admitting: Pharmacist

## 2021-08-07 ENCOUNTER — Other Ambulatory Visit: Payer: Self-pay

## 2021-08-07 NOTE — Telephone Encounter (Signed)
--   Laura Small - Wednesday, August 07, 2021 11:14 AM -- Received pharmacy printout today from Madelaine Bhat for Trulicity 1.5mg  Inject 1.5mg  into the skin once a week, # 4. Printed application mailing patient her portion to sign & return, and sending dr portion to [facility]. (per Madelaine Bhat we are sampling patient 0.75mg  from our stock)

## 2021-08-19 ENCOUNTER — Encounter: Payer: Self-pay | Admitting: Internal Medicine

## 2021-08-19 ENCOUNTER — Telehealth: Payer: Self-pay | Admitting: Pharmacist

## 2021-08-19 NOTE — Telephone Encounter (Signed)
08/19/2021 8:49:40 AM - Trulicity app pending  -- Rhetta Mura - Monday, August 19, 2021 8:48 AM --I have received the signed provider portion of [employer] application for Trulicity 1.5--holding for patient to sign their portion-put in bag on the wall with other meds for patient to sign.

## 2021-08-21 ENCOUNTER — Ambulatory Visit: Payer: Self-pay | Admitting: Internal Medicine

## 2021-08-21 ENCOUNTER — Encounter: Payer: Self-pay | Admitting: Internal Medicine

## 2021-08-21 ENCOUNTER — Other Ambulatory Visit: Payer: Self-pay

## 2021-08-21 VITALS — BP 144/75 | HR 84 | Temp 98.0°F

## 2021-08-21 DIAGNOSIS — G8929 Other chronic pain: Secondary | ICD-10-CM

## 2021-08-21 DIAGNOSIS — E876 Hypokalemia: Secondary | ICD-10-CM

## 2021-08-21 DIAGNOSIS — M545 Low back pain, unspecified: Secondary | ICD-10-CM

## 2021-08-21 DIAGNOSIS — Z794 Long term (current) use of insulin: Secondary | ICD-10-CM

## 2021-08-21 DIAGNOSIS — F321 Major depressive disorder, single episode, moderate: Secondary | ICD-10-CM

## 2021-08-21 NOTE — Progress Notes (Signed)
Established Patient Office Visit  Subjective:  Patient ID: Laura Small, female    DOB: 10/03/1960  Age: 60 y.o. MRN: [id number]  CC:  Chief Complaint  Patient presents with   Follow-up    Pt is following up for diabetes. Pt reports a steady decline in numbers since starting Trulicity last week. Lowest reading was 73 in evenings pt reports dizziness, nausea, and lightheadedness.     HPI Laura Small is a 60 y/o female who presents for follow-up for her diabetes. Pt reports a steady decline in numbers since starting Trulicity last week. Pt denies any side effects. Reports her first low (70's) in the evenings. She reports this low was accompanied by dizziness and nausea. Patient states that she has been able to cut back on insulin she no longer takes Humalog but states she uses Basaglar 50 units in the mornings but adjusts as needed (by 2 units). Patient reports she has an increase in energy and is feeling much better.  Pt reports pain from the waist down. She describes it as "nerve pain that is not diabetic" that has been ongoing for 5 years. Pt reports that the pain is in her lower back and down to her feet she states she can "feel things growing in her feet" so she is unable to walk. Patient has neurology appointment in January at [facility]   Discussed patients labs revealing low potassium. Patient reports that this is an ongoing issue. She is asymptomatic and there is no obvious cause for this. Will follow this with regular labs and should discuss with endocrinologist at next appointment in February.   Past Medical History:  Diagnosis Date   Back pain    Chicken pox    Diabetes mellitus without complication (HCC)    DJD (degenerative joint disease)    Migraines    PONV (postoperative nausea and vomiting)    usually has 1 episode of vomiting within 1 hour of anesthesia     Past Surgical History:  Procedure Laterality Date   BACK SURGERY     CHOLECYSTECTOMY      2011   INSERTION OF MESH N/A 02/12/2015   Procedure: INSERTION OF MESH;  Surgeon: Sherri Rad, MD;  Location: [facility] ORS;  Service: General;  Laterality: N/A;   VENTRAL HERNIA REPAIR N/A 02/12/2015   Procedure: HERNIA REPAIR VENTRAL ADULT;  Surgeon: Sherri Rad, MD;  Location: [facility] ORS;  Service: General;  Laterality: N/A;    Family History  Problem Relation Age of Onset   Diabetes Mother    Gallbladder disease Mother    Heart disease Father    Alcohol abuse Father    Gallbladder disease Father    Gallbladder disease Brother    Diabetes Mellitus II Maternal Grandmother    Diabetes Mellitus II Maternal Grandfather    Colon cancer Neg Hx     Social History   Socioeconomic History   Marital status: Divorced    Spouse name: Not on file   Number of children: Not on file   Years of education: Not on file   Highest education level: Not on file  Occupational History   Not on file  Tobacco Use   Smoking status: Every Day    Packs/day: 0.05    Types: Cigarettes   Smokeless tobacco: Never   Tobacco comments:    Pt does not desire to quit   Substance and Sexual Activity   Alcohol use: No    Comment: reports alcohol abuse in teenage  years   Drug use: No   Sexual activity: Not Currently    Partners: Male  Other Topics Concern   Not on file  Social History Narrative   Lives with husband   Social Determinants of Health   Financial Resource Strain: Not on file  Food Insecurity: Not on file  Transportation Needs: Not on file  Physical Activity: Not on file  Stress: Not on file  Social Connections: Not on file  Intimate Partner Violence: Not on file    Outpatient Medications Prior to Visit  Medication Sig Dispense Refill   aspirin 81 MG chewable tablet Chew 324 mg by mouth in the morning, at noon, and at bedtime.     Dulaglutide (TRULICITY) 2.50 IB/7.0WU SOPN Inject 0.75 mg into the skin once a week. 2 mL 0   Insulin Glargine (BASAGLAR KWIKPEN) 100 UNIT/ML INJECT 40 UNITS INTO  THE SKIN DAILY (Patient taking differently: 50 Units. Sliding scale 20-50 units in the morning) 15 mL 5   b complex vitamins capsule Take 1 capsule by mouth daily. (Patient not taking: Reported on 07/01/2021)     blood glucose meter kit and supplies KIT Dispense based on patient and insurance preference. Use up to four times daily as directed. (FOR ICD-9 250.00, 250.01). 1 each 0   Blood Glucose Monitoring Suppl (RIGHTEST GM550 BLOOD GLUCOSE) w/Device KIT Use as directed 1 kit 0   [START ON 09/06/2021] Dulaglutide (TRULICITY) 1.5 GQ/9.1QX SOPN Inject 1.5 mg into the skin once a week. 6 mL 1   glucose blood (RIGHTEST GS550 BLOOD GLUCOSE) test strip Use as directed to check blood sugar before meals and at bedtime. (Usually 3 times a day). 100 each 11   glucose blood test strip Use as directed to check blood sugar before meals and at bedtime. (Usually 3 times a day). 300 each 4   HUMALOG 100 UNIT/ML injection Inject 3 units into the skin 3 times a day. (Patient taking differently: Inject 4 Units into the skin.) 10 mL 10   Insulin Pen Needle (NOVOFINE PEN NEEDLE) 32G X 6 MM MISC Use as directed 100 each PRN   Insulin Syringe-Needle U-100 (BD INSULIN SYRINGE U/F) 31G X 5/16" 0.3 ML MISC Use as directed 100 each PRN   magnesium oxide (MAG-OX) 400 MG tablet Take 400 mg by mouth daily. (Patient not taking: No sig reported)     Potassium 99 MG TABS Take 1 tablet by mouth daily. (Patient not taking: Reported on 07/01/2021)     Rightest GL300 Lancets MISC Use as directed to check blood sugar before meals and at bedtime. (Usually 3 times a day). 100 each 11   No facility-administered medications prior to visit.    Allergies  Allergen Reactions   Bee Venom Anaphylaxis and Swelling   Advil [Ibuprofen] Swelling   Jardiance [Empagliflozin] Rash   Nitroglycerin Other (See Comments)    Pt states "causes me to feel off and weird all over and pressure in hands"   Gabapentin     Reports being "intoxicated":  dizzy, "loss of memory," "could not walk in a straight line," "all the effects of alcohol"   Metformin And Related Diarrhea, Nausea And Vomiting and Swelling    Also loss of appetite   Tramadol     "while I was taking it, I have no memory of any of my classes"   Prednisone Swelling    ROS Review of Systems    Objective:    Physical Exam  BP (!) 144/75 (BP  Location: Right Arm, Patient Position: Sitting, Cuff Size: Normal)   Pulse 84   Temp 98 F (36.7 C)   SpO2 96%  Wt Readings from Last 3 Encounters:  07/31/21 225 lb (102.1 kg)  04/03/21 221 lb 3.2 oz (100.3 kg)  11/06/20 225 lb (102.1 kg)     Health Maintenance Due  Topic Date Due   COVID-19 Vaccine (1) Never done   Pneumococcal Vaccine 13-79 Years old (1 - PCV) Never done   OPHTHALMOLOGY EXAM  Never done   HIV Screening  Never done   Hepatitis C Screening  Never done   Zoster Vaccines- Shingrix (1 of 2) Never done   PAP SMEAR-Modifier  Never done   COLONOSCOPY (Pts 45-39yr Insurance coverage will need to be confirmed)  Never done   MAMMOGRAM  Never done   URINE MICROALBUMIN  08/22/2021    There are no preventive care reminders to display for this patient.  Lab Results  Component Value Date   TSH 6.110 (H) 07/31/2021   Lab Results  Component Value Date   WBC 9.8 07/31/2021   HGB 15.4 07/31/2021   HCT 45.2 07/31/2021   MCV 85 07/31/2021   PLT 319 07/31/2021   Lab Results  Component Value Date   NA 136 07/31/2021   K 3.2 (L) 07/31/2021   CO2 22 07/31/2021   GLUCOSE 415 (H) 07/31/2021   BUN 5 (L) 07/31/2021   CREATININE 0.66 07/31/2021   BILITOT 0.5 07/31/2021   ALKPHOS 174 (H) 07/31/2021   AST 11 07/31/2021   ALT 14 07/31/2021   PROT 6.4 07/31/2021   ALBUMIN 4.0 07/31/2021   CALCIUM 9.0 07/31/2021   ANIONGAP 9 06/21/2020   EGFR 102 07/31/2021   GFR 119.49 07/28/2018   Lab Results  Component Value Date   CHOL 265 (H) 07/31/2021   Lab Results  Component Value Date   HDL 24 (L) 07/31/2021    Lab Results  Component Value Date   LDLCALC 202 (H) 07/31/2021   Lab Results  Component Value Date   TRIG 201 (H) 07/31/2021   Lab Results  Component Value Date   CHOLHDL 11.0 (H) 07/31/2021   Lab Results  Component Value Date   HGBA1C 12.6 (H) 07/31/2021      Assessment & Plan:   Problem List Items Addressed This Visit       Endocrine   Type 2 diabetes mellitus with hyperglycemia, with long-term current use of insulin (HCC) - Primary     Other   Chronic low back pain    1. Type 2 diabetes mellitus with hyperglycemia, with long-term current use of insulin ([facility] Patient recently was seen by diabetic specialist in our endocrinology clinic. She started on Trulicity and has found it is working well to improve in sugars and wellbeing. She has been able to reduce long acting insulin and is continuing to adjust dosage as needed.   2. Chronic low back pain, unspecified back pain laterality, unspecified whether sciatica present Continues to be frustrated by lower abdominal back pains. [facility] and discern between neuropathy, arthritis., etc....   3. Depression Continues to be challenged by her frustration related to her chronic medical problems, specifically back pain and inability to walk  4. Hypokalemia Recent labs show that she is running a low normal potassium at 3.2. Cause is not apparent. She is not on a medication that would lower potassium and  is not having symptoms of vomiting and diarrhea. Reports more than a 20 year history of fluctuating potassium. I am not aware of a previous extensive evaluation for the cause of this. Asked her to review this at her upcoming endocrinology appointment.   No intervention planned at this time but will continue to follow.  Patient is working towards establishing long term primary care with [facility] primary care gorup. As she is currently getting  endocrine care through the [facility] collaboration with [facility] and has an appointment in February. Plan to do comprehensive labs piror to follow-up. Hopefully endocrinology can be transferred to [facility] endocrinology group for long term care.  Follow up with Dr.Chaplin on January 18 to review pending labs.  In attendance today was her brother who says he has some psychological training. Attempted to answer questions and be supportive of long term care.    Dede Query, CMA

## 2021-08-27 ENCOUNTER — Ambulatory Visit: Payer: Self-pay | Admitting: Licensed Clinical Social Worker

## 2021-08-29 ENCOUNTER — Other Ambulatory Visit: Payer: Self-pay

## 2021-08-29 ENCOUNTER — Ambulatory Visit: Payer: Self-pay | Admitting: Licensed Clinical Social Worker

## 2021-08-29 DIAGNOSIS — F331 Major depressive disorder, recurrent, moderate: Secondary | ICD-10-CM

## 2021-08-29 DIAGNOSIS — F4310 Post-traumatic stress disorder, unspecified: Secondary | ICD-10-CM

## 2021-08-29 DIAGNOSIS — F411 Generalized anxiety disorder: Secondary | ICD-10-CM
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
## 2021-09-06 ENCOUNTER — Other Ambulatory Visit: Payer: Self-pay

## 2021-09-09 ENCOUNTER — Other Ambulatory Visit: Payer: Self-pay

## 2021-09-11 ENCOUNTER — Telehealth: Payer: Self-pay | Admitting: Pharmacist

## 2021-09-11 NOTE — Telephone Encounter (Signed)
09/11/2021 4:21:30 PM - Trulicity 1.5mg  faxed to [facility]  -- Rhetta Mura - Wednesday, September 11, 2021 4:20 PM --Daylene Posey application for [employer] 1.5mg  once a week # 4 boxes.

## 2021-10-02 ENCOUNTER — Other Ambulatory Visit: Payer: Self-pay

## 2021-10-03 ENCOUNTER — Other Ambulatory Visit: Payer: Self-pay

## 2021-10-09 ENCOUNTER — Other Ambulatory Visit: Payer: Self-pay

## 2021-10-09 ENCOUNTER — Ambulatory Visit: Payer: Self-pay | Admitting: Internal Medicine

## 2021-10-09 DIAGNOSIS — E1165 Type 2 diabetes mellitus with hyperglycemia: Secondary | ICD-10-CM

## 2021-10-09 DIAGNOSIS — Z794 Long term (current) use of insulin: Secondary | ICD-10-CM

## 2021-10-10 LAB — CBC WITH DIFFERENTIAL/PLATELET
Basophils Absolute: 0.1 10*3/uL (ref 0.0–0.2)
Basos: 1 %
EOS (ABSOLUTE): 0 10*3/uL (ref 0.0–0.4)
Eos: 0 %
Hematocrit: 46.2 % (ref 34.0–46.6)
Hemoglobin: 16.2 g/dL — ABNORMAL HIGH (ref 11.1–15.9)
Immature Grans (Abs): 0 10*3/uL (ref 0.0–0.1)
Immature Granulocytes: 0 %
Lymphocytes Absolute: 4.5 10*3/uL — ABNORMAL HIGH (ref 0.7–3.1)
Lymphs: 40 %
MCH: 28.8 pg (ref 26.6–33.0)
MCHC: 35.1 g/dL (ref 31.5–35.7)
MCV: 82 fL (ref 79–97)
Monocytes Absolute: 0.6 10*3/uL (ref 0.1–0.9)
Monocytes: 6 %
Neutrophils Absolute: 6 10*3/uL (ref 1.4–7.0)
Neutrophils: 53 %
Platelets: 426 10*3/uL (ref 150–450)
RBC: 5.63 x10E6/uL — ABNORMAL HIGH (ref 3.77–5.28)
RDW: 13.2 % (ref 11.7–15.4)
WBC: 11.2 10*3/uL — ABNORMAL HIGH (ref 3.4–10.8)

## 2021-10-10 LAB — COMPREHENSIVE METABOLIC PANEL
ALT: 12 IU/L (ref 0–32)
AST: 12 IU/L (ref 0–40)
Albumin/Globulin Ratio: 1.4 (ref 1.2–2.2)
Albumin: 4 g/dL (ref 3.8–4.9)
Alkaline Phosphatase: 148 IU/L — ABNORMAL HIGH (ref 44–121)
BUN/Creatinine Ratio: 9 (ref 9–23)
BUN: 6 mg/dL (ref 6–24)
Bilirubin Total: 0.2 mg/dL (ref 0.0–1.2)
CO2: 19 mmol/L — ABNORMAL LOW (ref 20–29)
Calcium: 9.5 mg/dL (ref 8.7–10.2)
Chloride: 102 mmol/L (ref 96–106)
Creatinine, Ser: 0.66 mg/dL (ref 0.57–1.00)
Globulin, Total: 2.9 g/dL (ref 1.5–4.5)
Glucose: 175 mg/dL — ABNORMAL HIGH (ref 70–99)
Potassium: 4.4 mmol/L (ref 3.5–5.2)
Sodium: 137 mmol/L (ref 134–144)
Total Protein: 6.9 g/dL (ref 6.0–8.5)
eGFR: 102 mL/min/{1.73_m2} (ref >59)

## 2021-10-10 LAB — LIPID PANEL
Chol/HDL Ratio: 8.4 ratio — ABNORMAL HIGH (ref 0.0–4.4)
Cholesterol, Total: 252 mg/dL — ABNORMAL HIGH (ref 100–199)
HDL: 30 mg/dL — ABNORMAL LOW (ref >39)
LDL Chol Calc (NIH): 183 mg/dL — ABNORMAL HIGH (ref 0–99)
Triglycerides: 204 mg/dL — ABNORMAL HIGH (ref 0–149)
VLDL Cholesterol Cal: 39 mg/dL (ref 5–40)

## 2021-10-10 LAB — HEMOGLOBIN A1C
Est. average glucose Bld gHb Est-mCnc: 229 mg/dL
Hgb A1c MFr Bld: 9.6 % — ABNORMAL HIGH (ref 4.8–5.6)

## 2021-10-10 LAB — MAGNESIUM: Magnesium: 2 mg/dL (ref 1.6–2.3)

## 2021-10-10 LAB — TSH: TSH: 6.76 u[IU]/mL — ABNORMAL HIGH (ref 0.450–4.500)

## 2021-10-16 ENCOUNTER — Encounter: Payer: Self-pay | Admitting: Internal Medicine

## 2021-10-16 ENCOUNTER — Ambulatory Visit: Payer: Self-pay | Admitting: Internal Medicine

## 2021-10-16 ENCOUNTER — Other Ambulatory Visit: Payer: Self-pay | Admitting: Internal Medicine

## 2021-10-16 ENCOUNTER — Other Ambulatory Visit: Payer: Self-pay

## 2021-10-16 VITALS — BP 147/83 | HR 91

## 2021-10-16 DIAGNOSIS — Z794 Long term (current) use of insulin: Secondary | ICD-10-CM

## 2021-10-16 DIAGNOSIS — E782 Mixed hyperlipidemia: Secondary | ICD-10-CM

## 2021-10-16 DIAGNOSIS — E1165 Type 2 diabetes mellitus with hyperglycemia: Secondary | ICD-10-CM

## 2021-10-16 DIAGNOSIS — M79671 Pain in right foot: Secondary | ICD-10-CM

## 2021-10-16 DIAGNOSIS — F321 Major depressive disorder, single episode, moderate: Secondary | ICD-10-CM | POA: Insufficient documentation

## 2021-10-16 DIAGNOSIS — M79672 Pain in left foot: Secondary | ICD-10-CM

## 2021-10-16 MED ORDER — TRULICITY 1.5 MG/0.5ML ~~LOC~~ SOAJ
1.5000 mg | SUBCUTANEOUS | 1 refills | Status: DC
  Filled 2021-10-16: qty 12, 84d supply, fill #0
  Filled 2021-10-29: qty 2, 28d supply, fill #0
  Filled 2021-10-29: qty 12, 84d supply, fill #0
  Filled 2021-12-02: qty 8, 112d supply, fill #1
  Filled 2022-03-25: qty 2, 28d supply, fill #0
  Filled 2022-03-25: qty 8, 112d supply, fill #2

## 2021-10-16 NOTE — Progress Notes (Signed)
Established Patient Office Visit  Subjective:  Patient ID: Laura Small, female    DOB: 01-28-1961  Age: 61 y.o. MRN: [id number]  CC:  Chief Complaint  Patient presents with   Follow-up    Labs drawn 10/09/2021    HPI Laura Small is a 61 y/o female who presents in-office today for follow-up from labs 10/09/2021 and for routine healthcare maintenance.   She reported that she had to reduced Basaglar to the point where she no longer uses it. Does not use Humalog. Reports very low readings in the past few days. Reports readings in the 140's in the evenings and 120's in the mornings. Patient is not actively trying to lose weight but is reporting weight loss - self reported a 27lb weight loss since starting Trulicity. Patient reports she has more energy and is frustrated that she cannot walk. Has appointment with [facility] when they come to [facility] 11/12/2021.   Patient was seen on 09/25/2021 at [facility] Neurology and is undergoing further testing. Pt reports her pain and lumps in her feet are getting worse. Patient was very agitated today when speaking about her pain. Patient is reports she is seeking funding for a second opinion.  Patient's lipid panel was elevated. She refuses medications. Does not want to contribute to any additional "damage" to her liver. Explained how refusing this may have negative effects on her overall health.  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   Expressed the importance of establishing care at [facility] primary care clinic that she is eligible for.    Past Medical History:  Diagnosis Date   Back pain    Chicken pox    Diabetes mellitus without complication (HCC)    DJD (degenerative joint disease)    Migraines    PONV (postoperative nausea and vomiting)    usually has 1 episode of vomiting within 1 hour of anesthesia     Past Surgical  History:  Procedure Laterality Date   BACK SURGERY     CHOLECYSTECTOMY     2011   INSERTION OF MESH N/A 02/12/2015   Procedure: INSERTION OF MESH;  Surgeon: Sherri Rad, MD;  Location: [facility] ORS;  Service: General;  Laterality: N/A;   VENTRAL HERNIA REPAIR N/A 02/12/2015   Procedure: HERNIA REPAIR VENTRAL ADULT;  Surgeon: Sherri Rad, MD;  Location: [facility] ORS;  Service: General;  Laterality: N/A;    Family History  Problem Relation Age of Onset   Diabetes Mother    Gallbladder disease Mother    Heart disease Father    Alcohol abuse Father    Gallbladder disease Father    Gallbladder disease Brother    Diabetes Mellitus II Maternal Grandmother    Diabetes Mellitus II Maternal Grandfather    Colon cancer Neg Hx     Social History   Socioeconomic History   Marital status: Divorced    Spouse name: Not on file   Number of children: Not on file   Years of education: Not on file   Highest education level: Not on file  Occupational History   Not on file  Tobacco Use   Smoking status: Every Day    Packs/day: 0.05    Types: Cigarettes   Smokeless tobacco: Never   Tobacco comments:    Pt does not desire to quit   Substance and Sexual Activity   Alcohol use: No  Comment: reports alcohol abuse in teenage years   Drug use: No   Sexual activity: Not Currently    Partners: Male  Other Topics Concern   Not on file  Social History Narrative   Lives with husband   Social Determinants of Health   Financial Resource Strain: Not on file  Food Insecurity: Not on file  Transportation Needs: Not on file  Physical Activity: Not on file  Stress: Not on file  Social Connections: Not on file  Intimate Partner Violence: Not on file    Outpatient Medications Prior to Visit  Medication Sig Dispense Refill   aspirin 81 MG chewable tablet Chew 324 mg by mouth in the morning, at noon, and at bedtime.     Dulaglutide (TRULICITY) 0.27 OZ/3.6UY SOPN Inject 1.5 mg (2 x 0.75 mg) into the skin  once a week. 12 mL 1   b complex vitamins capsule Take 1 capsule by mouth daily. (Patient not taking: Reported on 07/01/2021)     blood glucose meter kit and supplies KIT Dispense based on patient and insurance preference. Use up to four times daily as directed. (FOR ICD-9 250.00, 250.01). 1 each 0   Blood Glucose Monitoring Suppl (RIGHTEST GM550 BLOOD GLUCOSE) w/Device KIT Use as directed 1 kit 0   glucose blood (RIGHTEST GS550 BLOOD GLUCOSE) test strip Use as directed to check blood sugar before meals and at bedtime. (Usually 3 times a day). 100 each 11   glucose blood test strip Use as directed to check blood sugar before meals and at bedtime. (Usually 3 times a day). 300 each 4   HUMALOG 100 UNIT/ML injection Inject 3 units into the skin 3 times a day. (Patient taking differently: Inject 4 Units into the skin.) 10 mL 10   Insulin Glargine (BASAGLAR KWIKPEN) 100 UNIT/ML INJECT 40 UNITS INTO THE SKIN DAILY (Patient not taking: Reported on 10/16/2021) 15 mL 5   Insulin Pen Needle (NOVOFINE PEN NEEDLE) 32G X 6 MM MISC Use as directed 100 each PRN   Insulin Syringe-Needle U-100 (BD INSULIN SYRINGE U/F) 31G X 5/16" 0.3 ML MISC Use as directed 100 each PRN   magnesium oxide (MAG-OX) 400 MG tablet Take 400 mg by mouth daily. (Patient not taking: No sig reported)     Potassium 99 MG TABS Take 1 tablet by mouth daily. (Patient not taking: Reported on 07/01/2021)     Rightest GL300 Lancets MISC Use as directed to check blood sugar before meals and at bedtime. (Usually 3 times a day). 100 each 11   No facility-administered medications prior to visit.    Allergies  Allergen Reactions   Bee Venom Anaphylaxis and Swelling   Advil [Ibuprofen] Swelling   Jardiance [Empagliflozin] Rash   Nitroglycerin Other (See Comments)    Pt states "causes me to feel off and weird all over and pressure in hands"   Gabapentin     Reports being "intoxicated": dizzy, "loss of memory," "could not walk in a straight line,"  "all the effects of alcohol"   Metformin And Related Diarrhea, Nausea And Vomiting and Swelling    Also loss of appetite   Tramadol     "while I was taking it, I have no memory of any of my classes"   Prednisone Swelling    ROS Review of Systems    Objective:    Physical Exam  BP (!) 147/83 (BP Location: Right Arm, Patient Position: Sitting, Cuff Size: Normal)    Pulse 91    SpO2  96%  Wt Readings from Last 3 Encounters:  10/09/21 199 lb 11.2 oz (90.6 kg)  07/31/21 225 lb (102.1 kg)  04/03/21 221 lb 3.2 oz (100.3 kg)     Health Maintenance Due  Topic Date Due   COVID-19 Vaccine (1) Never done   OPHTHALMOLOGY EXAM  Never done   HIV Screening  Never done   Hepatitis C Screening  Never done   Zoster Vaccines- Shingrix (1 of 2) Never done   PAP SMEAR-Modifier  Never done   COLONOSCOPY (Pts 45-30yrs Insurance coverage will need to be confirmed)  Never done   MAMMOGRAM  Never done   URINE MICROALBUMIN  08/22/2021    There are no preventive care reminders to display for this patient.  Lab Results  Component Value Date   TSH 6.760 (H) 10/09/2021   Lab Results  Component Value Date   WBC 11.2 (H) 10/09/2021   HGB 16.2 (H) 10/09/2021   HCT 46.2 10/09/2021   MCV 82 10/09/2021   PLT 426 10/09/2021   Lab Results  Component Value Date   NA 137 10/09/2021   K 4.4 10/09/2021   CO2 19 (L) 10/09/2021   GLUCOSE 175 (H) 10/09/2021   BUN 6 10/09/2021   CREATININE 0.66 10/09/2021   BILITOT 0.2 10/09/2021   ALKPHOS 148 (H) 10/09/2021   AST 12 10/09/2021   ALT 12 10/09/2021   PROT 6.9 10/09/2021   ALBUMIN 4.0 10/09/2021   CALCIUM 9.5 10/09/2021   ANIONGAP 9 06/21/2020   EGFR 102 10/09/2021   GFR 119.49 07/28/2018   Lab Results  Component Value Date   CHOL 252 (H) 10/09/2021   Lab Results  Component Value Date   HDL 30 (L) 10/09/2021   Lab Results  Component Value Date   LDLCALC 183 (H) 10/09/2021   Lab Results  Component Value Date   TRIG 204 (H) 10/09/2021    Lab Results  Component Value Date   CHOLHDL 8.4 (H) 10/09/2021   Lab Results  Component Value Date   HGBA1C 9.6 (H) 10/09/2021      Assessment & Plan:   Problem List Items Addressed This Visit       Cardiovascular and Mediastinum   Aortic atherosclerosis ([place])     Endocrine   Type 2 diabetes mellitus with hyperglycemia, with long-term current use of insulin (HCC) - Primary     Other   Hyperlipidemia   Bilateral foot pain   Current moderate episode of major depressive disorder without prior episode ([place])    1. Type 2 diabetes mellitus with hyperglycemia, with long-term current use of insulin ([place]) Since last visit she has been able to reduce/discontinue all insulin and is only reliant on Trulicity. Reports sugar levels below 200 on all checks but none below 100. Clinically she expressed hs feels much better and has much more energy since getting off insulin. 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 She has a follow-up with [facility] endocrinology in February.   2. Bilateral foot pain Her multiple neurological symptoms of pain in he legs, hips, and her inability to ambulate has had a recent revaluation by the neurologist at [facility]. Additional studies are ongoing. Will follow-up with them.   3. Mixed hyperlipidemia Longstanding elevation of her lipids. She categorically is unwilling to take any statins. She believes they cause more problems than they help. My reassurance of their benefit was unhelpful.  Have encouraged her to follow-up with [facility] primary care as  they would offer her the best primary care and continuity of care among specialist. We, at the [facility], will continue to offer her support as her [facility] physicians see fit. Will tentatively plan to see her again in 6 months.    Follow-up: Follow-up in 6 months with appropriate labs.    Dede Query, CMA

## 2021-10-17 ENCOUNTER — Other Ambulatory Visit: Payer: Self-pay

## 2021-10-29 ENCOUNTER — Other Ambulatory Visit: Payer: Self-pay

## 2021-11-05 ENCOUNTER — Ambulatory Visit: Payer: Self-pay

## 2021-11-12 ENCOUNTER — Ambulatory Visit: Payer: Self-pay

## 2021-11-12 NOTE — Progress Notes (Incomplete)
Follow up Diabetes/ Endocrine [facility]     Patient ID: Laura Small, female   DOB: May 05, 1961, 61 y.o.   MRN: [id number] Assessment:  Laura Small is a 61 y.o. female who is seen in follow up for No primary diagnosis found. at the request of [name], [name], NP.  Encounter Diagnoses No diagnosis found.  Assessment    Plan:        There are no Patient Instructions on file for this visit.   No orders of the defined types were placed in this encounter.    Subjective:  Laura Small is a 61 year old female presenting for T2DM care management follow up. Her A1c is downtrending from 12.6 on 07/31/21 to 9.6 on 10/09/21.   A1c: 9.6 Date: 1/18 Labs:  Lipid panel: TG at 204, on 1/18  TSH: 6.7   Hypogly (fatigue, shaking, passing out)  Hypergly (polydipsia, polyuria, gastroparesis):  Vision:  Eye doctor visit:   Diabetes care:  BG: 140s in evenings and 120s in mornings  BP  27 lbs of weight loss after trulicity    ROS:  Reports weight loss  SOB:  Chest Pain:   Medications:  Statins!  She titrated off all insulin (basaglar 50 units and humalog 4 units, 3x a day) and is only on  Trulicity   Physical:  Injection site:     Review of Systems  Laura Small  has a past medical history of Back pain, Chicken pox, Diabetes mellitus without complication (HCC), DJD (degenerative joint disease), Migraines, and PONV (postoperative nausea and vomiting).  Family History, Social History, current Medications and allergies reviewed and updated in Epic.  Objective:    There were no vitals taken for this visit. Physical Exam      Data : I have personally reviewed pertinent labs and imaging studies, if indicated,  with the patient in clinic today.   Lab Orders  No laboratory test(s) ordered today    HC Readings from Last 3 Encounters:  No data found for [facility]    Wt Readings from Last 3 Encounters:  10/09/21 199 lb 11.2 oz (90.6  kg)  07/31/21 225 lb (102.1 kg)  04/03/21 221 lb 3.2 oz (100.3 kg)

## 2021-12-02 ENCOUNTER — Other Ambulatory Visit: Payer: Self-pay

## 2021-12-24 ENCOUNTER — Other Ambulatory Visit: Payer: Self-pay

## 2021-12-24 ENCOUNTER — Encounter: Payer: Self-pay | Admitting: Emergency Medicine

## 2021-12-24 ENCOUNTER — Emergency Department
Admission: EM | Admit: 2021-12-24 | Discharge: 2021-12-24 | Disposition: A | Discharge status: Home / Self Care | Payer: Self-pay | Attending: Emergency Medicine | Admitting: Emergency Medicine

## 2021-12-24 DIAGNOSIS — M792 Neuralgia and neuritis, unspecified: Secondary | ICD-10-CM | POA: Insufficient documentation

## 2021-12-24 MED ORDER — PREGABALIN 75 MG PO CAPS: 75.0000 mg | ORAL_CAPSULE | Freq: Three times a day (TID) | ORAL | 1 refills | Status: AC

## 2021-12-24 MED ORDER — MORPHINE SULFATE (PF) 4 MG/ML IV SOLN
4.0000 mg | Freq: Once | INTRAVENOUS | Status: AC
  Administered 2021-12-24: 4 mg via INTRAMUSCULAR
  Filled 2021-12-24: qty 1

## 2021-12-24 MED ORDER — TRAMADOL HCL 50 MG PO TABS: 50.0000 mg | ORAL_TABLET | Freq: Four times a day (QID) | ORAL | 0 refills | Status: DC | PRN

## 2021-12-24 MED ORDER — PROMETHAZINE HCL 25 MG PO TABS
25.0000 mg | ORAL_TABLET | Freq: Once | ORAL | Status: AC
  Administered 2021-12-24: 25 mg via ORAL
  Filled 2021-12-24: qty 1

## 2021-12-24 MED ORDER — ORPHENADRINE CITRATE 30 MG/ML IJ SOLN
60.0000 mg | Freq: Once | INTRAMUSCULAR | Status: AC
Start: 2021-12-24 — End: 2021-12-24
  Administered 2021-12-24: 60 mg via INTRAMUSCULAR
  Filled 2021-12-24: qty 2

## 2021-12-24 MED ORDER — DIPHENHYDRAMINE HCL 50 MG/ML IJ SOLN
50.0000 mg | Freq: Once | INTRAMUSCULAR | Status: AC
  Administered 2021-12-24: 50 mg via INTRAMUSCULAR
  Filled 2021-12-24: qty 1

## 2021-12-24 NOTE — ED Triage Notes (Signed)
Pt to ED from home c/o bilateral leg numbness, weakness and pain for 5 years but worsening last couple days.  States hx of nerve pain and this is a flare up.  Denies recent injury, states has been sitting more than normal recently.  Pt A&Ox4, chest rise even and unlabored, skin WNL and in NAD at this time. ?

## 2021-12-24 NOTE — ED Notes (Signed)
Pt signed esignature.  D/c  inst to pt.  

## 2021-12-24 NOTE — ED Provider Notes (Signed)
? ?[facility] ?Provider Note ? ?Patient Contact: 9:19 PM (approximate) ? ? ?History  ? ?Weakness, Numbness (/), and Leg Pain ? ? ?HPI ? ?Laura Small is a 61 y.o. female who presents the emergency department complaining of bilateral leg pain/numbness.  Patient states that this has been ongoing for many years.  She has noticed a worsening over the last 5 years and states that given the amount of pain and weakness that she has primarily been getting around with a wheelchair.  Patient has had some bowel and bladder incontinence.  These issues have been chronic and the patient has been evaluated with MRIs in the past without evidence of central cord impingement.  She has been seen by neurosurgery and neurology.  She states that over the last several months that she has had a progressive worsening.  She states that she has not been given any definitive answers.  Patient is very upset as she recently saw [facility] neurology, was recommended that she have an EMG.  They were supposed to reach out and schedule this for her but never contacted her.  Patient then followed up with the [facility] ED, was given similar answers but has not received calls from any of the referrals.  There has been no new injuries.  Patient states that the symptoms are roughly the same.  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  She has tried medications in the past, states that either they are not effective or she had side effects of medications.  She been placed on gabapentin and states that she had falls, confusion, altered mental status and did not want to try the Lyrica prescribed by [facility] as they told her it was similar to gabapentin. ?  ? ? ?Physical Exam  ? ?Triage Vital Signs: ?ED Triage Vitals  ?Enc Vitals Group  ?   BP 12/24/21 1920 140/78  ?   Pulse Rate 12/24/21 1920 (!) 103  ?   Resp 12/24/21 1920 16  ?    Temp 12/24/21 1920 97.6 ?F (36.4 ?C)  ?   Temp Source 12/24/21 1920 Oral  ?   SpO2 12/24/21 1920 99 %  ?   Weight 12/24/21 1919 187 lb (84.8 kg)  ?   Height 12/24/21 1919 5\' 7"  (1.702 m)  ?   Head Circumference --   ?   Peak Flow --   ?   Pain Score 12/24/21 1923 10  ?   Pain Loc --   ?   Pain Edu? --   ?   Excl. in GC? --   ? ? ?Most recent vital signs: ?Vitals:  ? 12/24/21 1920  ?BP: 140/78  ?Pulse: (!) 103  ?Resp: 16  ?Temp: 97.6 ?F (36.4 ?C)  ?SpO2: 99%  ? ? ? ?General: Alert and in no acute distress.  ?Cardiovascular:  Good peripheral perfusion ?Respiratory: Normal respiratory effort without tachypnea or retractions. Lungs CTAB.  ?Gastrointestinal: Bowel sounds ?4 quadrants. Soft and nontender to palpation. No guarding or rigidity. No palpable masses. No distention. No CVA tenderness. ?Musculoskeletal: Full range of motion to all extremities.  Patient with sensitivity to touch along the posterior legs bilaterally.  She has decreased sensation and stocking distribution just distal to the knee but this is equal bilaterally.  She has hypersensitivity to palpation along the bottoms of her feet.  There is no loss of sensation in any dermatomal distribution.  There is no visible abnormality, deformity.  She still able to move the hips, knees, ankles and toes.  Pulses intact both to the posterior tibialis as well as dorsalis pedis n no skin temperature changes.  No discoloration of the skin.   ?Neurologic:  No gross focal neurologic deficits are appreciated.  ?Skin:   No rash noted ?Other: ? ? ?ED Results / Procedures / Treatments  ? ?Labs ?(all labs ordered are listed, but only abnormal results are displayed) ?Labs Reviewed - No data to display ? ? ?EKG ? ? ? ? ?RADIOLOGY ? ?MRI imaging from September 2019 is reviewed at this time.  Small degenerative changes but no central cord impingement. ? ?No results found. ? ?PROCEDURES: ? ?Critical Care performed: No ? ?Procedures ? ? ?MEDICATIONS ORDERED IN ED: ?Medications   ?morphine (PF) 4 MG/ML injection 4 mg (4 mg Intramuscular Given 12/24/21 2133)  ?orphenadrine (NORFLEX) injection 60 mg (60 mg Intramuscular Given 12/24/21 2135)  ?promethazine (PHENERGAN) tablet 25 mg (25 mg Oral Given 12/24/21 2130)  ?diphenhydrAMINE (BENADRYL) injection 50 mg (50 mg Intramuscular Given 12/24/21 2131)  ? ? ? ?IMPRESSION / MDM / ASSESSMENT AND PLAN / ED COURSE  ?I reviewed the triage vital signs and the nursing notes. ?             ?               ? ?Differential diagnosis includes, but is not limited to, lumbar radiculitis/radiculopathy, myelopathy, impingement, sciatica, neuropathy, peripheral vascular disease ? ? ?Patient's diagnosis is consistent with neuropathic pain.  Patient presented to the emergency department with ongoing symptoms.  She has pain radiating through the sciatic nerve distribution starting in the buttocks region down her leg.  She has decreased sensation below her knees bilaterally with some hypersensitivity to the soles of her feet.  [facility].  They recommended an EMG but the patient was never contacted by  [facility] to [facility].  Differential is broad with neurology but differential includes myelopathy, lumbar radiculopathy, neuropathy.  There is been no acute changes, just ongoing progression of same symptoms.  MRI from 2019 revealed no central cord impingement.  There is been no acute changes to warrant another MRI this evening.  Patient is primarily frustrated, understandably so, that she does not have definitive answers on her condition.  Patient would like to see another clinic for neurology follow-up.  At [facility] time [facility].  I do feel that the patient would benefit from an EMG.  Patient would also like referral to pain management as she has this ongoing pain and would like to talk about pain management options until they can come up with a definitive answer and treatment plan for her.  I will refer her to pain  management at this time.  Concerning signs and symptoms are discussed with the patient.  She is given medications tonight to alleviate some of her symptoms and is instructed to follow-up with the 2 specialist above.  Patient is given ED precautions to return to the ED for any worsening or new symptoms. ? ? ? ?  ? ? ?FINAL CLINICAL IMPRESSION(S) / ED DIAGNOSES  ? ?Final diagnoses:  ?Neuropathic pain  ? ? ? ?Rx / DC Orders  ? ?ED Discharge Orders   ? ?      Ordered  ?  traMADol (ULTRAM) 50 MG tablet  Every 6 hours PRN       ?  12/24/21 2157  ?  pregabalin (LYRICA) 75 MG capsule  3 times daily       ? 12/24/21 2157  ? ?  ?  ? ?  ? ? ? ?Note:  This document was prepared using Dragon voice recognition software and may include unintentional dictation errors. ?  ?Racheal Patches, PA-C ?12/24/21 2309 ? ?  ?Concha Se, MD ?12/25/21 0006 ? ?

## 2021-12-31 ENCOUNTER — Emergency Department
Admission: EM | Admit: 2021-12-31 | Discharge: 2021-12-31 | Disposition: A | Discharge status: Home / Self Care | Payer: Self-pay | Attending: Emergency Medicine | Admitting: Emergency Medicine

## 2021-12-31 ENCOUNTER — Encounter: Payer: Self-pay | Admitting: Emergency Medicine

## 2021-12-31 ENCOUNTER — Other Ambulatory Visit: Payer: Self-pay

## 2021-12-31 DIAGNOSIS — G629 Polyneuropathy, unspecified: Secondary | ICD-10-CM | POA: Insufficient documentation

## 2021-12-31 DIAGNOSIS — Z760 Encounter for issue of repeat prescription: Secondary | ICD-10-CM | POA: Insufficient documentation

## 2021-12-31 MED ORDER — TRAMADOL HCL 50 MG PO TABS: 50.0000 mg | ORAL_TABLET | Freq: Four times a day (QID) | ORAL | 0 refills | Status: AC | PRN

## 2021-12-31 MED ORDER — TRAMADOL HCL 50 MG PO TABS
50.0000 mg | ORAL_TABLET | Freq: Once | ORAL | Status: AC
  Administered 2021-12-31: 50 mg via ORAL
  Filled 2021-12-31: qty 1

## 2021-12-31 NOTE — ED Triage Notes (Signed)
Pt states she's tried tramadol and lyrica but states she's out of her tramadol and that's what worked.  ?

## 2021-12-31 NOTE — ED Triage Notes (Signed)
First nurse note: pt comes ems from home with neuropathy. Was seen and treated for same a few weeks ago. States unable to get seen by neurologist so pt came back here. VSS.  ?

## 2021-12-31 NOTE — ED Provider Notes (Signed)
? ?  [facility] ?Provider Note ? ? ? Event Date/Time  ? First MD Initiated Contact with Patient 12/31/21 1731   ?  (approximate) ? ? ?History  ? ?Peripheral Neuropathy and Medication Refill ? ? ?HPI ? ?Laura Small is a 61 y.o. female who reports a history of peripheral neuropathy in her legs.  She states the only thing that works for this is tramadol which she has run out of.  She is try to get a PCP but having significant difficulty.  She is requesting medication refill ?  ? ? ?Physical Exam  ? ?Triage Vital Signs: ?ED Triage Vitals  ?Enc Vitals Group  ?   BP 12/31/21 1656 130/82  ?   Pulse Rate 12/31/21 1656 91  ?   Resp 12/31/21 1656 18  ?   Temp 12/31/21 1656 98.7 ?F (37.1 ?C)  ?   Temp Source 12/31/21 1656 Oral  ?   SpO2 12/31/21 1656 98 %  ?   Weight 12/31/21 1732 84.8 kg (186 lb 15.2 oz)  ?   Height 12/31/21 1732 1.702 m (5\' 7" )  ?   Head Circumference --   ?   Peak Flow --   ?   Pain Score 12/31/21 1655 10  ?   Pain Loc --   ?   Pain Edu? --   ?   Excl. in GC? --   ? ? ?Most recent vital signs: ?Vitals:  ? 12/31/21 1656  ?BP: 130/82  ?Pulse: 91  ?Resp: 18  ?Temp: 98.7 ?F (37.1 ?C)  ?SpO2: 98%  ? ? ? ?General: Awake, no distress.  ?CV:  Good peripheral perfusion.  ?Resp:  Normal effort.  ?Abd:  No distention.  ?Other:  Normal strength in the lower extremities ? ? ?ED Results / Procedures / Treatments  ? ?Labs ?(all labs ordered are listed, but only abnormal results are displayed) ?Labs Reviewed - No data to display ? ? ?EKG ? ? ? ? ?RADIOLOGY ? ? ? ? ?PROCEDURES: ? ?Critical Care performed:  ? ?Procedures ? ? ?MEDICATIONS ORDERED IN ED: ?Medications  ?traMADol (ULTRAM) tablet 50 mg (50 mg Oral Given 12/31/21 1750)  ? ? ? ?IMPRESSION / MDM / ASSESSMENT AND PLAN / ED COURSE  ?I reviewed the triage vital signs and the nursing notes. ? ? ? ?Patient here for medication refill, tramadol prescription provided ? ? ? ?  ? ? ?FINAL CLINICAL IMPRESSION(S) / ED DIAGNOSES  ? ?Final  diagnoses:  ?Medication refill  ?Neuropathy  ? ? ? ?Rx / DC Orders  ? ?ED Discharge Orders   ? ?      Ordered  ?  traMADol (ULTRAM) 50 MG tablet  Every 6 hours PRN       ? 12/31/21 1747  ? ?  ?  ? ?  ? ? ? ?Note:  This document was prepared using Dragon voice recognition software and may include unintentional dictation errors. ?  ?03/02/22, MD ?12/31/21 2244 ? ?

## 2022-01-02 ENCOUNTER — Ambulatory Visit: Payer: Self-pay | Admitting: Pharmacy Technician

## 2022-01-02 DIAGNOSIS — Z79899 Other long term (current) drug therapy: Secondary | ICD-10-CM

## 2022-01-02 NOTE — Progress Notes (Signed)
Received updated proof of income.  Patient eligible to receive medication assistance at [facility] until time for re-certification in 2024, and as long as eligibility requirements continue to be met. ? ?Laura Small J. Laura Small ?Care Manager ?[facility]  ?

## 2022-01-09 ENCOUNTER — Other Ambulatory Visit: Payer: Self-pay

## 2022-01-09 ENCOUNTER — Emergency Department
Admission: EM | Admit: 2022-01-09 | Discharge: 2022-01-10 | Disposition: A | Discharge status: Home / Self Care | Payer: Self-pay | Attending: Emergency Medicine | Admitting: Emergency Medicine

## 2022-01-09 ENCOUNTER — Encounter: Payer: Self-pay | Admitting: *Deleted

## 2022-01-09 DIAGNOSIS — E119 Type 2 diabetes mellitus without complications: Secondary | ICD-10-CM | POA: Insufficient documentation

## 2022-01-09 DIAGNOSIS — E876 Hypokalemia: Secondary | ICD-10-CM

## 2022-01-09 DIAGNOSIS — F32A Depression, unspecified: Secondary | ICD-10-CM | POA: Diagnosis present

## 2022-01-09 DIAGNOSIS — F321 Major depressive disorder, single episode, moderate: Secondary | ICD-10-CM | POA: Diagnosis present

## 2022-01-09 DIAGNOSIS — R030 Elevated blood-pressure reading, without diagnosis of hypertension: Secondary | ICD-10-CM | POA: Diagnosis present

## 2022-01-09 DIAGNOSIS — Z720 Tobacco use: Secondary | ICD-10-CM | POA: Diagnosis present

## 2022-01-09 DIAGNOSIS — E1165 Type 2 diabetes mellitus with hyperglycemia: Secondary | ICD-10-CM | POA: Diagnosis present

## 2022-01-09 DIAGNOSIS — F4321 Adjustment disorder with depressed mood: Secondary | ICD-10-CM | POA: Diagnosis present

## 2022-01-09 DIAGNOSIS — Z20822 Contact with and (suspected) exposure to covid-19: Secondary | ICD-10-CM | POA: Insufficient documentation

## 2022-01-09 DIAGNOSIS — E785 Hyperlipidemia, unspecified: Secondary | ICD-10-CM | POA: Diagnosis present

## 2022-01-09 DIAGNOSIS — F419 Anxiety disorder, unspecified: Secondary | ICD-10-CM | POA: Diagnosis present

## 2022-01-09 DIAGNOSIS — F4320 Adjustment disorder, unspecified: Secondary | ICD-10-CM | POA: Insufficient documentation

## 2022-01-09 LAB — CBC WITH DIFFERENTIAL/PLATELET
Abs Immature Granulocytes: 0.05 10*3/uL (ref 0.00–0.07)
Basophils Absolute: 0.1 10*3/uL (ref 0.0–0.1)
Basophils Relative: 1 %
Eosinophils Absolute: 0 10*3/uL (ref 0.0–0.5)
Eosinophils Relative: 0 %
HCT: 47.5 % — ABNORMAL HIGH (ref 36.0–46.0)
Hemoglobin: 16 g/dL — ABNORMAL HIGH (ref 12.0–15.0)
Immature Granulocytes: 0 %
Lymphocytes Relative: 33 %
Lymphs Abs: 4.3 10*3/uL — ABNORMAL HIGH (ref 0.7–4.0)
MCH: 28.2 pg (ref 26.0–34.0)
MCHC: 33.7 g/dL (ref 30.0–36.0)
MCV: 83.6 fL (ref 80.0–100.0)
Monocytes Absolute: 1 10*3/uL (ref 0.1–1.0)
Monocytes Relative: 8 %
Neutro Abs: 7.8 10*3/uL — ABNORMAL HIGH (ref 1.7–7.7)
Neutrophils Relative %: 58 %
Platelets: 538 10*3/uL — ABNORMAL HIGH (ref 150–400)
RBC: 5.68 MIL/uL — ABNORMAL HIGH (ref 3.87–5.11)
RDW: 13.2 % (ref 11.5–15.5)
WBC: 13.3 10*3/uL — ABNORMAL HIGH (ref 4.0–10.5)
nRBC: 0 % (ref 0.0–0.2)

## 2022-01-09 LAB — POC URINE PREG, ED: Preg Test, Ur: NEGATIVE

## 2022-01-09 MED ORDER — LORAZEPAM 1 MG PO TABS
1.0000 mg | ORAL_TABLET | Freq: Once | ORAL | Status: AC
  Administered 2022-01-09: 1 mg via ORAL
  Filled 2022-01-09: qty 1

## 2022-01-09 NOTE — ED Notes (Signed)
Pt states that her little brother passed about a month ago and pt has been having a hard time grieving. Pt is very tearful at this time. Pt states she has been having difficulty sleeping since the passing of her brother. Pt denies any SI or HI.  ?

## 2022-01-09 NOTE — ED Provider Notes (Signed)
? ?[facility] ?Provider Note ? ? ? Event Date/Time  ? First MD Initiated Contact with Patient 01/09/22 2209   ?  (approximate) ? ? ?History  ? ?Anxiety ? ? ?HPI ? ?Laura Small is a 61 y.o. female who presents to the ED for evaluation of Anxiety ?  ?Reviewed PCP visit from 1/25.  History of DM, migraines ? ?Patient presents to the ED, accompanied by her husband, for evaluation of uncontrolled grief and anxiety and paranoia associated with the recent unexpected passing of her younger brother.  She reports being at home when her younger brother passed.  They were first responders and were present while EMS was working his cardiac arrest, but he was unable to be resuscitated and was pronounced dead in front of them. ? ?This was 1 month ago.  Since then, she reports having difficulty coping at home.  Difficulty performing her typical tasks, eating or drinking.  Frequently thinks she hears Katherina Right, her brother, and another room.  Waking up in a panic and thinking they need to unlock the front door to let him in.  Not sleeping at night. ? ?Denies any active suicidal thoughts or trying to harm herself.  Denies taking any recreational or prescription medications to harm herself. ? ?Physical Exam  ? ?Triage Vital Signs: ?ED Triage Vitals  ?Enc Vitals Group  ?   BP 01/09/22 2148 (!) 148/94  ?   Pulse Rate 01/09/22 2148 98  ?   Resp 01/09/22 2148 20  ?   Temp 01/09/22 2148 98.8 ?F (37.1 ?C)  ?   Temp Source 01/09/22 2148 Oral  ?   SpO2 01/09/22 2148 98 %  ?   Weight 01/09/22 2149 185 lb (83.9 kg)  ?   Height 01/09/22 2149 5\' 7"  (1.702 m)  ?   Head Circumference --   ?   Peak Flow --   ?   Pain Score 01/09/22 2154 0  ?   Pain Loc --   ?   Pain Edu? --   ?   Excl. in GC? --   ? ? ?Most recent vital signs: ?Vitals:  ? 01/09/22 2148  ?BP: (!) 148/94  ?Pulse: 98  ?Resp: 20  ?Temp: 98.8 ?F (37.1 ?C)  ?SpO2: 98%  ? ? ?General: Awake.  Quickly breaks down crying and is quite tearful.  She has linear  thought processes.  Sitting crosslegged on the edge of the bed. ?CV:  Good peripheral perfusion.  ?Resp:  Normal effort.  ?Abd:  No distention.  ?MSK:  No deformity noted.  ?Neuro:  No focal deficits appreciated. ?Other:   ? ? ?ED Results / Procedures / Treatments  ? ?Labs ?(all labs ordered are listed, but only abnormal results are displayed) ?Labs Reviewed  ?RESP PANEL BY RT-PCR (FLU A&B, COVID) [id number]  ?COMPREHENSIVE METABOLIC PANEL  ?ETHANOL  ?URINE DRUG SCREEN, QUALITATIVE ([facility] ONLY)  ?CBC WITH DIFFERENTIAL/PLATELET  ?POC URINE PREG, ED  ? ? ?EKG ? ? ?RADIOLOGY ? ? ?Official radiology report(s): ?No results found. ? ?PROCEDURES and INTERVENTIONS: ? ?Procedures ? ?Medications  ?LORazepam (ATIVAN) tablet 1 mg (has no administration in time range)  ? ? ? ?IMPRESSION / MDM / ASSESSMENT AND PLAN / ED COURSE  ?I reviewed the triage vital signs and the nursing notes. ? ?61 year old female presents to the ED with a fairly severe grief reaction after the passing of her brother unexpectedly 1 month ago.  She is not actively suicidal, but has significant reaction  that is preventing her from performing her ADLs at home.  I think she needs to be evaluated by psychiatry and observed overnight or possibly admitted inpatient.  At this point I do not see any indications to IVC the patient as she wants to be here and is agreeable to stay.  We will hold here voluntarily and consult psychiatry to evaluate the patient. ? ?  ? ? ?FINAL CLINICAL IMPRESSION(S) / ED DIAGNOSES  ? ?Final diagnoses:  ?Grief reaction  ? ? ? ?Rx / DC Orders  ? ?ED Discharge Orders   ? ? None  ? ?  ? ? ? ?Note:  This document was prepared using Dragon voice recognition software and may include unintentional dictation errors. ?  ?Delton Prairie, MD ?01/09/22 2232 ? ?

## 2022-01-09 NOTE — ED Notes (Signed)
Pt's belongings were placed in a pt's belonging bag and pt dressed in burgundy scrubs and yellow non-skid socks.  ? ?Pt's Belongings:  ?Purple tennis shoes ?Pink underwear ?Gray sweat pants.  ?Black t-shirt.  ?

## 2022-01-09 NOTE — ED Triage Notes (Signed)
Pt reports feeling sad since her brother died 1 month ago.  Pt tearful and having crying spells since he died.  Pt denies SI or HI.  Pt denies drugs or etoh use.  Pt alert  speech clear.   ?

## 2022-01-10 ENCOUNTER — Other Ambulatory Visit: Payer: Self-pay

## 2022-01-10 DIAGNOSIS — F419 Anxiety disorder, unspecified: Secondary | ICD-10-CM

## 2022-01-10 DIAGNOSIS — F320 Major depressive disorder, single episode, mild: Secondary | ICD-10-CM

## 2022-01-10 DIAGNOSIS — F4321 Adjustment disorder with depressed mood: Secondary | ICD-10-CM

## 2022-01-10 LAB — BASIC METABOLIC PANEL
Anion gap: 11 (ref 5–15)
BUN: 9 mg/dL (ref 6–20)
CO2: 25 mmol/L (ref 22–32)
Calcium: 8.9 mg/dL (ref 8.9–10.3)
Chloride: 105 mmol/L (ref 98–111)
Creatinine, Ser: 0.61 mg/dL (ref 0.44–1.00)
GFR, Estimated: >60 mL/min (ref >60)
Glucose, Bld: 214 mg/dL — ABNORMAL HIGH (ref 70–99)
Potassium: 3.4 mmol/L — ABNORMAL LOW (ref 3.5–5.1)
Sodium: 141 mmol/L (ref 135–145)

## 2022-01-10 LAB — URINE DRUG SCREEN, QUALITATIVE (ARMC ONLY)
Amphetamines, Ur Screen: NOT DETECTED
Barbiturates, Ur Screen: NOT DETECTED
Benzodiazepine, Ur Scrn: NOT DETECTED
Cannabinoid 50 Ng, Ur ~~LOC~~: NOT DETECTED
Cocaine Metabolite,Ur ~~LOC~~: NOT DETECTED
MDMA (Ecstasy)Ur Screen: NOT DETECTED
Methadone Scn, Ur: NOT DETECTED
Opiate, Ur Screen: NOT DETECTED
Phencyclidine (PCP) Ur S: NOT DETECTED
Tricyclic, Ur Screen: NOT DETECTED

## 2022-01-10 LAB — COMPREHENSIVE METABOLIC PANEL
ALT: 14 U/L (ref 0–44)
AST: 23 U/L (ref 15–41)
Albumin: 3.6 g/dL (ref 3.5–5.0)
Alkaline Phosphatase: 100 U/L (ref 38–126)
Anion gap: 14 (ref 5–15)
BUN: 7 mg/dL (ref 6–20)
CO2: 23 mmol/L (ref 22–32)
Calcium: 9.3 mg/dL (ref 8.9–10.3)
Chloride: 102 mmol/L (ref 98–111)
Creatinine, Ser: 0.61 mg/dL (ref 0.44–1.00)
GFR, Estimated: >60 mL/min (ref >60)
Glucose, Bld: 184 mg/dL — ABNORMAL HIGH (ref 70–99)
Potassium: 2.4 mmol/L — CL (ref 3.5–5.1)
Sodium: 139 mmol/L (ref 135–145)
Total Bilirubin: 0.7 mg/dL (ref 0.3–1.2)
Total Protein: 7.9 g/dL (ref 6.5–8.1)

## 2022-01-10 LAB — RESP PANEL BY RT-PCR (FLU A&B, COVID) ARPGX2
Influenza A by PCR: NEGATIVE
Influenza B by PCR: NEGATIVE
SARS Coronavirus 2 by RT PCR: NEGATIVE

## 2022-01-10 LAB — ETHANOL: Alcohol, Ethyl (B): <10 mg/dL (ref <10)

## 2022-01-10 MED ORDER — HYDROXYZINE HCL 10 MG PO TABS
10.0000 mg | ORAL_TABLET | Freq: Three times a day (TID) | ORAL | Status: DC | PRN
  Filled 2022-01-10: qty 1

## 2022-01-10 MED ORDER — ASPIRIN 81 MG PO CHEW
324.0000 mg | CHEWABLE_TABLET | Freq: Three times a day (TID) | ORAL | Status: DC
  Administered 2022-01-10: 324 mg via ORAL
  Filled 2022-01-10: qty 4

## 2022-01-10 MED ORDER — POTASSIUM CHLORIDE CRYS ER 20 MEQ PO TBCR
60.0000 meq | EXTENDED_RELEASE_TABLET | Freq: Once | ORAL | Status: DC
Start: 2022-01-10 — End: 2022-01-10
  Filled 2022-01-10: qty 3

## 2022-01-10 MED ORDER — ACETAMINOPHEN 325 MG PO TABS
650.0000 mg | ORAL_TABLET | Freq: Once | ORAL | Status: AC
  Administered 2022-01-10: 650 mg via ORAL
  Filled 2022-01-10: qty 2

## 2022-01-10 MED ORDER — DULAGLUTIDE 1.5 MG/0.5ML ~~LOC~~ SOAJ: 1.5000 mg | SUBCUTANEOUS | Status: DC

## 2022-01-10 MED ORDER — ONDANSETRON 4 MG PO TBDP
4.0000 mg | ORAL_TABLET | Freq: Once | ORAL | Status: AC
  Administered 2022-01-10: 4 mg via ORAL
  Filled 2022-01-10: qty 1

## 2022-01-10 MED ORDER — PRAZOSIN HCL 1 MG PO CAPS: 1.0000 mg | ORAL_CAPSULE | Freq: Every day | ORAL | Status: DC

## 2022-01-10 MED ORDER — POTASSIUM CHLORIDE 20 MEQ PO PACK
60.0000 meq | PACK | Freq: Once | ORAL | Status: AC
Start: 2022-01-10 — End: 2022-01-10
  Administered 2022-01-10: 60 meq via ORAL
  Filled 2022-01-10: qty 3

## 2022-01-10 MED ORDER — PRAZOSIN HCL 1 MG PO CAPS
1.0000 mg | ORAL_CAPSULE | Freq: Every day | ORAL | 2 refills | Status: DC
  Filled 2022-01-10 – 2022-02-17 (×2): qty 30, 30d supply, fill #0
  Filled 2022-02-17: qty 30, 30d supply, fill #1

## 2022-01-10 MED ORDER — HYDROXYZINE HCL 10 MG PO TABS
10.0000 mg | ORAL_TABLET | Freq: Three times a day (TID) | ORAL | 2 refills | Status: DC | PRN
  Filled 2022-01-10: qty 90, 30d supply, fill #0
  Filled 2022-02-17: qty 90, 30d supply, fill #1
  Filled 2022-02-17: qty 90, 30d supply, fill #0

## 2022-01-10 MED ORDER — MIRTAZAPINE 15 MG PO TABS
7.5000 mg | ORAL_TABLET | Freq: Every day | ORAL | 2 refills | Status: DC
  Filled 2022-01-10 – 2022-02-17 (×2): qty 15, 30d supply, fill #0
  Filled 2022-02-17: qty 15, 30d supply, fill #1

## 2022-01-10 MED ORDER — PREGABALIN 75 MG PO CAPS
75.0000 mg | ORAL_CAPSULE | Freq: Three times a day (TID) | ORAL | Status: DC
Start: 2022-01-10 — End: 2022-01-10
  Administered 2022-01-10: 75 mg via ORAL
  Filled 2022-01-10: qty 1

## 2022-01-10 MED ORDER — POTASSIUM CHLORIDE 20 MEQ PO PACK
40.0000 meq | PACK | Freq: Every day | ORAL | Status: DC
  Administered 2022-01-10: 40 meq via ORAL
  Filled 2022-01-10: qty 2

## 2022-01-10 MED ORDER — MIRTAZAPINE 15 MG PO TABS: 7.5000 mg | ORAL_TABLET | Freq: Every day | ORAL | Status: DC

## 2022-01-10 NOTE — Consult Note (Signed)
[facility] Face-to-Face Psychiatry Consult  ? ?Reason for Consult: Anxiety ?Referring Physician: Dr. Tamala Julian ?Patient Identification: Laura Small ?MRN:  [id number] ?Principal Diagnosis: <principal problem not specified> ?Diagnosis:  Active Problems: ?  Unresolved grief ?  Hyperlipidemia ?  Tobacco abuse ?  Depression ?  Elevated BP without diagnosis of hypertension ?  Type 2 diabetes mellitus with hyperglycemia (HCC) ?  Anxiety ? ? ?Total Time spent with patient: 30 minutes ? ?Subjective:  "Keep me from dreaming." ? ?62 yo female presented to the ED related to grief from her brother dying a month ago.  She has been having bad dreams about his dying and anxiety with grief.  Moderate to high depression, no suicidal ideations.  Denies homicidal ideations and psychosis and substance use.  Lanae Boast from [place] came to see her in the ED to establish care.  Her sleep is poor related to anxiety and nightmares surrounding her brother's death.  Started prazosin (blood pressure elevated also) for nightmares and Remeron for sleep.  Hydroxyzine 10 mg TID PRN for anxiety also started.  She will follow up with [facility] for therapy and medication management. ? ?Per Caroline Sauger, PMHNP on admission: ?Laura Small is a 61 y.o. female patient presented to [facility] ED via POV voluntarily. The patient reported feeling sad since her brother's death about one month ago. The patient was tearful during her initial assessment. The patient shared that she has not gotten in touch with a grief counselor, nor is she seeing a psychiatric provider. This provider saw the patient face-to-face; the chart was reviewed, and consulted with Dr. Tamala Julian on 01/09/2022 due to the patient's care. The patient is not on any psychiatric medication regimen. It was discussed with the EDP that the patient does meet the criteria to be admitted to the psychiatric inpatient unit.  ?On evaluation, the patient is alert and oriented x 4, emotional but cooperative,  and mood-congruent with affect. The patient does not appear to be responding to internal or external stimuli. Neither is the patient presenting with any delusional thinking. The patient denies auditory or visual hallucinations. The patient denies any suicidal, homicidal, or self-harm ideations. The patient is not presenting with any psychotic or paranoid behaviors. During an encounter with the patient, she could answer questions appropriately. ? ?HPI: Per Dr. Tamala Julian, Laura Small is a 61 y.o. female who presents to the ED for evaluation of Anxiety ?  ?Reviewed PCP visit from 1/25.  History of DM, migraines ?  ?Patient presents to the ED, accompanied by her husband, for evaluation of uncontrolled grief and anxiety and paranoia associated with the recent unexpected passing of her younger brother.  She reports being at home when her younger brother passed.  They were first responders and were present while EMS was working his cardiac arrest, but he was unable to be resuscitated and was pronounced dead in front of them. ?  ?This was 1 month ago.  Since then, she reports having difficulty coping at home.  Difficulty performing her typical tasks, eating or drinking.  Frequently thinks she hears [name], her brother, and another room.  Waking up in a panic and thinking they need to unlock the front door to let him in.  Not sleeping at night. ?  ?Denies any active suicidal thoughts or trying to harm herself.  Denies taking any recreational or prescription medications to harm herself. ? ?Past Psychiatric History: History reviewed. No pertinent past psychiatric history ? ?Risk to Self:  none ?Risk to Others:  none ?Prior Inpatient Therapy:  none ?Prior Outpatient Therapy:  none ? ?Past Medical History:  ?Past Medical History:  ?Diagnosis Date  ? Back pain   ? Chicken pox   ? Diabetes mellitus without complication ([place])   ? DJD (degenerative joint disease)   ? Migraines   ? PONV (postoperative nausea and vomiting)   ?  usually has 1 episode of vomiting within 1 hour of anesthesia   ?  ?Past Surgical History:  ?Procedure Laterality Date  ? BACK SURGERY    ? CHOLECYSTECTOMY    ? 2011  ? INSERTION OF MESH N/A 02/12/2015  ? Procedure: INSERTION OF MESH;  Surgeon: Sherri Rad, MD;  Location: [facility] ORS;  Service: General;  Laterality: N/A;  ? VENTRAL HERNIA REPAIR N/A 02/12/2015  ? Procedure: HERNIA REPAIR VENTRAL ADULT;  Surgeon: Sherri Rad, MD;  Location: [facility] ORS;  Service: General;  Laterality: N/A;  ? ?Family History:  ?Family History  ?Problem Relation Age of Onset  ? Diabetes Mother   ? Gallbladder disease Mother   ? Heart disease Father   ? Alcohol abuse Father   ? Gallbladder disease Father   ? Gallbladder disease Brother   ? Diabetes Mellitus II Maternal Grandmother   ? Diabetes Mellitus II Maternal Grandfather   ? Colon cancer Neg Hx   ? ?Family Psychiatric  History:  ?Social History:  ?Social History  ? ?Substance and Sexual Activity  ?Alcohol Use No  ? Comment: reports alcohol abuse in teenage years  ?   ?Social History  ? ?Substance and Sexual Activity  ?Drug Use No  ?  ?Social History  ? ?Socioeconomic History  ? Marital status: Divorced  ?  Spouse name: Not on file  ? Number of children: Not on file  ? Years of education: Not on file  ? Highest education level: Not on file  ?Occupational History  ? Not on file  ?Tobacco Use  ? Smoking status: Some Days  ?  Packs/day: 0.05  ?  Types: Cigarettes  ? Smokeless tobacco: Never  ? Tobacco comments:  ?  Pt does not desire to quit   ?Substance and Sexual Activity  ? Alcohol use: No  ?  Comment: reports alcohol abuse in teenage years  ? Drug use: No  ? Sexual activity: Not Currently  ?  Partners: Male  ?Other Topics Concern  ? Not on file  ?Social History Narrative  ? Lives with husband  ? ?Social Determinants of Health  ? ?Financial Resource Strain: Not on file  ?Food Insecurity: Not on file  ?Transportation Needs: Not on file  ?Physical Activity: Not on file  ?Stress: Not on file   ?Social Connections: Not on file  ? ?Additional Social History: ?  ? ?Allergies:   ?Allergies  ?Allergen Reactions  ? Bee Venom Anaphylaxis and Swelling  ? Advil [Ibuprofen] Swelling  ? Jardiance [Empagliflozin] Rash  ? Nitroglycerin Other (See Comments)  ?  Pt states "causes me to feel off and weird all over and pressure in hands"  ? Gabapentin   ?  Reports being "intoxicated": dizzy, "loss of memory," "could not walk in a straight line," "all the effects of alcohol"  ? Metformin And Related Diarrhea, Nausea And Vomiting and Swelling  ?  Also loss of appetite  ? Tramadol   ?  "while I was taking it, I have no memory of any of my classes"  ? Prednisone Swelling  ? ? ?Labs:  ?Results for orders placed or performed during  the hospital encounter of 01/09/22 (from the past 48 hour(s))  ?Resp Panel by RT-PCR (Flu A&B, Covid) Nasopharyngeal Swab     Status: None  ? Collection Time: 01/09/22 11:04 PM  ? Specimen: Nasopharyngeal Swab; Nasopharyngeal(NP) swabs in vial transport medium  ?Result Value Ref Range  ? SARS Coronavirus 2 by RT PCR NEGATIVE NEGATIVE  ?  Comment: (NOTE) ?SARS-CoV-2 target nucleic acids are NOT DETECTED. ? ?The SARS-CoV-2 RNA is generally detectable in upper respiratory ?specimens during the acute phase of infection. The lowest ?concentration of SARS-CoV-2 viral copies this assay can detect is ?138 copies/mL. A negative result does not preclude SARS-Cov-2 ?infection and should not be used as the sole basis for treatment or ?other patient management decisions. A negative result may occur with  ?improper specimen collection/handling, submission of specimen other ?than nasopharyngeal swab, presence of viral mutation(s) within the ?areas targeted by this assay, and inadequate number of viral ?copies(<138 copies/mL). A negative result must be combined with ?clinical observations, patient history, and epidemiological ?information. The expected result is Negative. ? ?Fact Sheet for Patients:   ?EntrepreneurPulse.com.au ? ?Fact Sheet for Healthcare Providers:  ?IncredibleEmployment.be ? ?This test is no t yet approved or cleared by the [place] and  ?has been [employer]

## 2022-01-10 NOTE — Consult Note (Signed)
[facility] Face-to-Face Psychiatry Consult  ? ?Reason for Consult: Anxiety ?Referring Physician: Dr. Tamala Julian ?Patient Identification: Laprincess Small ?MRN:  [id number] ?Principal Diagnosis: <principal problem not specified> ?Diagnosis:  Active Problems: ?  Hyperlipidemia ?  Tobacco abuse ?  Depression ?  Elevated BP without diagnosis of hypertension ?  Type 2 diabetes mellitus with hyperglycemia (HCC) ?  Anxiety ?  Current moderate episode of major depressive disorder without prior episode ([place]) ?  Unresolved grief ? ? ?Total Time spent with patient: 1 hour ? ?Subjective: "I lost my brother a month ago. It was unexpected." ?Laura Small is a 61 y.o. female patient presented to [facility] ED via POV voluntarily. The patient reported feeling sad since her brother's death about one month ago. The patient was tearful during her initial assessment. The patient shared that she has not gotten in touch with a grief counselor, nor is she seeing a psychiatric provider. This provider saw the patient face-to-face; the chart was reviewed, and consulted with Dr. Tamala Julian on 01/09/2022 due to the patient's care. The patient is not on any psychiatric medication regimen. It was discussed with the EDP that the patient does meet the criteria to be admitted to the psychiatric inpatient unit.  ?On evaluation, the patient is alert and oriented x 4, emotional but cooperative, and mood-congruent with affect. The patient does not appear to be responding to internal or external stimuli. Neither is the patient presenting with any delusional thinking. The patient denies auditory or visual hallucinations. The patient denies any suicidal, homicidal, or self-harm ideations. The patient is not presenting with any psychotic or paranoid behaviors. During an encounter with the patient, she could answer questions appropriately. ? ?HPI: Per Dr. Tamala Julian, Laura Small is a 61 y.o. female who presents to the ED for evaluation of Anxiety ?   ?Reviewed PCP visit from 1/25.  History of DM, migraines ?  ?Patient presents to the ED, accompanied by her husband, for evaluation of uncontrolled grief and anxiety and paranoia associated with the recent unexpected passing of her younger brother.  She reports being at home when her younger brother passed.  They were first responders and were present while EMS was working his cardiac arrest, but he was unable to be resuscitated and was pronounced dead in front of them. ?  ?This was 1 month ago.  Since then, she reports having difficulty coping at home.  Difficulty performing her typical tasks, eating or drinking.  Frequently thinks she hears [name], her brother, and another room.  Waking up in a panic and thinking they need to unlock the front door to let him in.  Not sleeping at night. ?  ?Denies any active suicidal thoughts or trying to harm herself.  Denies taking any recreational or prescription medications to harm herself. ? ?Past Psychiatric History: History reviewed. No pertinent past psychiatric history ? ?Risk to Self:   ?Risk to Others:   ?Prior Inpatient Therapy:   ?Prior Outpatient Therapy:   ? ?Past Medical History:  ?Past Medical History:  ?Diagnosis Date  ? Back pain   ? Chicken pox   ? Diabetes mellitus without complication ([place])   ? DJD (degenerative joint disease)   ? Migraines   ? PONV (postoperative nausea and vomiting)   ? usually has 1 episode of vomiting within 1 hour of anesthesia   ?  ?Past Surgical History:  ?Procedure Laterality Date  ? BACK SURGERY    ? CHOLECYSTECTOMY    ? 2011  ? INSERTION OF  MESH N/A 02/12/2015  ? Procedure: INSERTION OF MESH;  Surgeon: Sherri Rad, MD;  Location: [facility] ORS;  Service: General;  Laterality: N/A;  ? VENTRAL HERNIA REPAIR N/A 02/12/2015  ? Procedure: HERNIA REPAIR VENTRAL ADULT;  Surgeon: Sherri Rad, MD;  Location: [facility] ORS;  Service: General;  Laterality: N/A;  ? ?Family History:  ?Family History  ?Problem Relation Age of Onset  ? Diabetes Mother   ?  Gallbladder disease Mother   ? Heart disease Father   ? Alcohol abuse Father   ? Gallbladder disease Father   ? Gallbladder disease Brother   ? Diabetes Mellitus II Maternal Grandmother   ? Diabetes Mellitus II Maternal Grandfather   ? Colon cancer Neg Hx   ? ?Family Psychiatric  History:  ?Social History:  ?Social History  ? ?Substance and Sexual Activity  ?Alcohol Use No  ? Comment: reports alcohol abuse in teenage years  ?   ?Social History  ? ?Substance and Sexual Activity  ?Drug Use No  ?  ?Social History  ? ?Socioeconomic History  ? Marital status: Divorced  ?  Spouse name: Not on file  ? Number of children: Not on file  ? Years of education: Not on file  ? Highest education level: Not on file  ?Occupational History  ? Not on file  ?Tobacco Use  ? Smoking status: Some Days  ?  Packs/day: 0.05  ?  Types: Cigarettes  ? Smokeless tobacco: Never  ? Tobacco comments:  ?  Pt does not desire to quit   ?Substance and Sexual Activity  ? Alcohol use: No  ?  Comment: reports alcohol abuse in teenage years  ? Drug use: No  ? Sexual activity: Not Currently  ?  Partners: Male  ?Other Topics Concern  ? Not on file  ?Social History Narrative  ? Lives with husband  ? ?Social Determinants of Health  ? ?Financial Resource Strain: Not on file  ?Food Insecurity: Not on file  ?Transportation Needs: Not on file  ?Physical Activity: Not on file  ?Stress: Not on file  ?Social Connections: Not on file  ? ?Additional Social History: ?  ? ?Allergies:   ?Allergies  ?Allergen Reactions  ? Bee Venom Anaphylaxis and Swelling  ? Advil [Ibuprofen] Swelling  ? Jardiance [Empagliflozin] Rash  ? Nitroglycerin Other (See Comments)  ?  Pt states "causes me to feel off and weird all over and pressure in hands"  ? Gabapentin   ?  Reports being "intoxicated": dizzy, "loss of memory," "could not walk in a straight line," "all the effects of alcohol"  ? Metformin And Related Diarrhea, Nausea And Vomiting and Swelling  ?  Also loss of appetite  ?  Tramadol   ?  "while I was taking it, I have no memory of any of my classes"  ? Prednisone Swelling  ? ? ?Labs:  ?Results for orders placed or performed during the hospital encounter of 01/09/22 (from the past 48 hour(s))  ?Resp Panel by RT-PCR (Flu A&B, Covid) Nasopharyngeal Swab     Status: None  ? Collection Time: 01/09/22 11:04 PM  ? Specimen: Nasopharyngeal Swab; Nasopharyngeal(NP) swabs in vial transport medium  ?Result Value Ref Range  ? SARS Coronavirus 2 by RT PCR NEGATIVE NEGATIVE  ?  Comment: (NOTE) ?SARS-CoV-2 target nucleic acids are NOT DETECTED. ? ?The SARS-CoV-2 RNA is generally detectable in upper respiratory ?specimens during the acute phase of infection. The lowest ?concentration of SARS-CoV-2 viral copies this assay can detect is ?[place]  copies/mL. A negative result does not preclude SARS-Cov-2 ?infection and should not be used as the sole basis for treatment or ?other patient management decisions. A negative result may occur with  ?improper specimen collection/handling, submission of specimen other ?than nasopharyngeal swab, presence of viral mutation(s) within the ?areas targeted by this assay, and inadequate number of viral ?copies(<138 copies/mL). A negative result must be combined with ?clinical observations, patient history, and epidemiological ?information. The expected result is Negative. ? ?Fact Sheet for Patients:  ?EntrepreneurPulse.com.au ? ?Fact Sheet for Healthcare Providers:  ?IncredibleEmployment.be ? ?This test is no t yet approved or cleared by the [place] FDA and  ?has been authorized for detection and/or diagnosis of SARS-CoV-2 by ?FDA under an Emergency Use Authorization (EUA). This EUA will remain  ?in effect (meaning this test can be used) for the duration of the ?COVID-19 declaration under Section 564(b)(1) of the Act, 21 ?U.S.C.section 360bbb-3(b)(1), unless the authorization is terminated  ?or revoked sooner.  ? ? ?  ? Influenza A by  PCR NEGATIVE NEGATIVE  ? Influenza B by PCR NEGATIVE NEGATIVE  ?  Comment: (NOTE) ?The Xpert Xpress SARS-CoV-2/FLU/RSV plus assay is intended as an aid ?in the diagnosis of influenza from Nasopharyngeal swab specimens a

## 2022-01-10 NOTE — ED Notes (Signed)
Psych NP with patient

## 2022-01-10 NOTE — ED Notes (Signed)
Pt feeling nauseous after attempting to take PO potassium pills. Pt threw up potassium pills. MD aware.  ?

## 2022-01-10 NOTE — ED Notes (Signed)
Husband called X 2, left HIPPA compliant message to return phone call. He will be taking patient home. ?

## 2022-01-10 NOTE — ED Notes (Signed)
Pt given breakfast at this time. 

## 2022-01-10 NOTE — Discharge Instructions (Signed)
[facility] ?Mental health service in [place], [place] ?Address: [address], [place], [place] [address] ?Hours:  ?Open ? Closes 5?PM ?Phone: [phone] ?

## 2022-01-10 NOTE — ED Notes (Signed)
VOL/pending psych inpatient admission when medically cleared. ?

## 2022-01-10 NOTE — BH Assessment (Signed)
Comprehensive Clinical Assessment (CCA) Note ? ?01/10/2022 ?Laura Small ?[id number] ? ?Chief Complaint: Patient is a 61 year old female presenting to [facility] ED voluntarily. Per triage note Pt reports feeling sad since her brother died 1 month ago.  Pt tearful and having crying spells since he died.  Pt denies SI or HI.  Pt denies drugs or etoh use.  Pt alert  speech clear. During assessment patient appears alert and oriented x4, calm and cooperative, mood appears depressed. Patient reports losing her brother recently and not receiving any help for her grief due to "I haven't talked to anyone because I feel like I would be considered weak." Patient reports not sleeping "I sleep about a hour and when I do I have dreams about him." Patient also reports "I feel hot and cold and nauseas." Patient denies any current therapist, psychiatrist or current medications. Patient denies SI/HI/AH/VH. ? ?Per Psyc NP Elenore Paddy patient is recommended for Inpatient ?Chief Complaint  ?Patient presents with  ? Anxiety  ? ?Visit Diagnosis: Depression, Grief  ? ? ?CCA Screening, Triage and Referral (STR) ? ?Patient Reported Information ?How did you hear about [place]? Self ? ?Referral name: No data recorded ?Referral phone number: No data recorded ? ?Whom do you see for routine medical problems? No data recorded ?Practice/Facility Name: No data recorded ?Practice/Facility Phone Number: No data recorded ?Name of Contact: No data recorded ?Contact Number: No data recorded ?Contact Fax Number: No data recorded ?Prescriber Name: No data recorded ?Prescriber Address (if known): No data recorded ? ?What Is the Reason for Your Visit/Call Today? Patient presents voluntarily due to grief of her brother ? ?How Long Has This Been Causing You Problems? 1 wk - 1 month ? ?What Do You Feel Would Help You the Most Today? No data recorded ? ?Have You Recently Been in Any Inpatient Treatment (Hospital/Detox/Crisis Center/28-Day Program)? No data  recorded ?Name/Location of Program/Hospital:No data recorded ?How Long Were You There? No data recorded ?When Were You Discharged? No data recorded ? ?Have You Ever Received Services From [employer] Before? No data recorded ?Who Do You See at [facility]? No data recorded ? ?Have You Recently Had Any Thoughts About Hurting Yourself? No ? ?Are You Planning to Commit Suicide/Harm Yourself At This time? No ? ? ?Have you Recently Had Thoughts About Hurting Someone Karolee Ohs? No ? ?Explanation: No data recorded ? ?Have You Used Any Alcohol or Drugs in the Past 24 Hours? No ? ?How Long Ago Did You Use Drugs or Alcohol? No data recorded ?What Did You Use and How Much? No data recorded ? ?Do You Currently Have a Therapist/Psychiatrist? No ? ?Name of Therapist/Psychiatrist: No data recorded ? ?Have You Been Recently Discharged From Any Office Practice or Programs? No ? ?Explanation of Discharge From Practice/Program: No data recorded ? ?  ?CCA Screening Triage Referral Assessment ?Type of Contact: Face-to-Face ? ?Is this Initial or Reassessment? No data recorded ?Date Telepsych consult ordered in [facility]:  No data recorded ?Time Telepsych consult ordered in [facility]:  No data recorded ? ?Patient Reported Information Reviewed? No data recorded ?Patient Left Without Being Seen? No data recorded ?Reason for Not Completing Assessment: No data recorded ? ?Collateral Involvement: No data recorded ? ?Does Patient Have a Automotive engineer Guardian? No data recorded ?Name and Contact of Legal Guardian: No data recorded ?If Minor and Not Living with Parent(s), Who has Custody? No data recorded ?Is CPS involved or ever been involved? Never ? ?Is APS involved or  ever been involved? Never ? ? ?Patient Determined To Be At Risk for Harm To Self or Others Based on Review of Patient Reported Information or Presenting Complaint? No ? ?Method: No data recorded ?Availability of Means: No data recorded ?Intent: No data recorded ?Notification Required: No  data recorded ?Additional Information for Danger to Others Potential: No data recorded ?Additional Comments for Danger to Others Potential: No data recorded ?Are There Guns or Other Weapons in Your Home? No data recorded ?Types of Guns/Weapons: No data recorded ?Are These Weapons Safely Secured?                            No data recorded ?Who Could Verify You Are Able To Have These Secured: No data recorded ?Do You Have any Outstanding Charges, Pending Court Dates, Parole/Probation? No data recorded ?Contacted To Inform of Risk of Harm To Self or Others: No data recorded ? ?[facility] ? ? ?Does Patient Present under Involuntary Commitment? No ? ?IVC Papers Initial File Date: No data recorded ? [place] ? ? ?Patient Currently Receiving the Following Services: No data recorded ? ?Determination of Need: Emergent (2 hours) ? ? ?Options For Referral: No data recorded ? ? ? ?CCA Biopsychosocial ?Intake/Chief Complaint:  No data recorded ?Current Symptoms/Problems: No data recorded ? ?Patient Reported Schizophrenia/Schizoaffective Diagnosis in Past: No ? ? ?Strengths: Patient is able to communicate her needs ? ?Preferences: No data recorded ?Abilities: No data recorded ? ?Type of Services Patient Feels are Needed: No data recorded ? ?Initial Clinical Notes/Concerns: No data recorded ? ?Mental Health Symptoms ?Depression:   ?Change in energy/activity; Fatigue; Hopelessness; Increase/decrease in appetite; Sleep (too much or little) ?  ?Duration of Depressive symptoms:  ?Greater than two weeks ?  ?Mania:   ?None ?  ?Anxiety:    ?Difficulty concentrating; Restlessness ?  ?Psychosis:   ?None ?  ?Duration of Psychotic symptoms: No data recorded  ?Trauma:   ?None ?  ?Obsessions:   ?None ?  ?Compulsions:   ?None ?  ?Inattention:   ?None ?  ?Hyperactivity/Impulsivity:   ?None ?  ?Oppositional/Defiant Behaviors:   ?None ?  ?Emotional Irregularity:   ?None ?  ?Other Mood/Personality  Symptoms:  No data recorded  ? ?Mental Status Exam ?Appearance and self-care  ?Stature:   ?Average ?  ?Weight:   ?Average weight ?  ?Clothing:   ?Casual ?  ?Grooming:   ?Normal ?  ?Cosmetic use:   ?None ?  ?Posture/gait:   ?Normal ?  ?Motor activity:   ?Not Remarkable ?  ?Sensorium  ?Attention:   ?Normal ?  ?Concentration:   ?Normal ?  ?Orientation:   ?X5 ?  ?Recall/memory:   ?Normal ?  ?Affect and Mood  ?Affect:   ?Appropriate ?  ?Mood:   ?Depressed ?  ?Relating  ?Eye contact:   ?Normal ?  ?Facial expression:   ?Responsive ?  ?Attitude toward examiner:   ?Cooperative ?  ?Thought and Language  ?Speech flow:  ?Clear and Coherent ?  ?Thought content:   ?Appropriate to Mood and Circumstances ?  ?Preoccupation:   ?None ?  ?Hallucinations:   ?None ?  ?Organization:  No data recorded  ?Executive Functions  ?Fund of Knowledge:   ?Fair ?  ?Intelligence:   ?Average ?  ?Abstraction:   ?Normal ?  ?Judgement:   ?Fair ?  ?Reality Testing:   ?Realistic ?  ?Insight:   ?Good ?  ?Decision Making:   ?  Normal ?  ?Social Functioning  ?Social Maturity:   ?Responsible ?  ?Social Judgement:   ?Normal ?  ?Stress  ?Stressors:   ?Grief/losses ?  ?Coping Ability:   ?Normal ?  ?Skill Deficits:   ?None ?  ?Supports:   ?Family ?  ? ? ?Religion: ?Religion/Spirituality ?Are You A Religious Person?: No ? ?Leisure/Recreation: ?Leisure / Recreation ?Do You Have Hobbies?: No ? ?Exercise/Diet: ?Exercise/Diet ?Do You Exercise?: No ?Have You Gained or Lost A Significant Amount of Weight in the Past Six Months?: No ?Do You Follow a Special Diet?: No ?Do You Have Any Trouble Sleeping?: No ? ? ?CCA Employment/Education ?Employment/Work Situation: ?Employment / Work Situation ?Employment Situation: Unemployed ?Patient's Job has Been Impacted by Current Illness: No ?Has Patient ever Been in the Military?: No ? ?Education: ?Education ?Is Patient Currently Attending School?: No ?Did You Have An Individualized Education Program (IIEP): No ?Did You Have Any  Difficulty At School?: No ?Patient's Education Has Been Impacted by Current Illness: No ? ? ?CCA Family/Childhood History ?Family and Relationship History: ?Family history ?Marital status: Single ?Does patient have

## 2022-01-10 NOTE — ED Provider Notes (Signed)
Review of patient's clinic notes shows that she is no longer taking insulin just the Trulicity.  I have reordered her home medication.  We will get fingersticks on her and see how she is doing.  Apparently we do not have Trulicity we have some formulary alternative which I have ordered.  Will be important to keep the fingersticks checked to see how she is doing ?  ?Arnaldo Natal, MD ?01/10/22 1002 ? ?

## 2022-01-10 NOTE — ED Provider Notes (Signed)
Emergency Medicine Observation Re-evaluation Note ? ?Laura Small is a 61 y.o. female, seen on rounds today.  Pt initially presented to the ED for complaints of Anxiety ?Currently, the patient is resting, voices no medical complaints. ? ?Physical Exam  ?BP (!) 140/91 (BP Location: Left Arm)   Pulse 98   Temp 97.9 ?F (36.6 ?C) (Oral)   Resp 19   Ht 5\' 7"  (1.702 m)   Wt 83.9 kg   SpO2 99%   BMI 28.98 kg/m?  ?Physical Exam ?General: Resting in no acute distress ?Cardiac: No cyanosis ?Lungs: Equal rise and fall ?Psych: Not agitated ? ?ED Course / MDM  ?EKG:  ? ?I have reviewed the labs performed to date as well as medications administered while in observation.  Recent changes in the last 24 hours include patient received Zofran for vomiting after taking large potassium pills.  These were changed to powder packet and patient tolerated well. ? ?Plan  ?Current plan is for psychiatric disposition. ? is not under involuntary commitment. ? ? ?  ?Larina Earthly, MD ?01/10/22 [phone] ? ?

## 2022-01-10 NOTE — ED Notes (Signed)
Husband Tasia Catchings to be here within 15 minutes to pickup patient.  ? ?Verified correct patient and correct discharge papers given. Pt alert and oriented X 4, stable for discharge. RR even and unlabored, color WNL. Discussed discharge instructions and follow-up as directed. Discharge medications discussed, when prescribed. Pt had opportunity to ask questions, and RN available to provide patient and/or family education. ? ?Left with all of belongings. ?

## 2022-01-14 ENCOUNTER — Institutional Professional Consult (permissible substitution): Payer: Self-pay | Admitting: Licensed Clinical Social Worker

## 2022-01-23 ENCOUNTER — Other Ambulatory Visit: Payer: Self-pay

## 2022-02-18 ENCOUNTER — Other Ambulatory Visit: Payer: Self-pay

## 2022-03-20 ENCOUNTER — Other Ambulatory Visit: Payer: Self-pay

## 2022-03-26 ENCOUNTER — Other Ambulatory Visit: Payer: Self-pay

## 2022-03-26 DIAGNOSIS — E782 Mixed hyperlipidemia: Secondary | ICD-10-CM

## 2022-03-26 DIAGNOSIS — E1165 Type 2 diabetes mellitus with hyperglycemia: Secondary | ICD-10-CM

## 2022-03-27 LAB — CBC WITH DIFFERENTIAL/PLATELET
Basophils Absolute: 0.1 10*3/uL (ref 0.0–0.2)
Basos: 1 %
EOS (ABSOLUTE): 0 10*3/uL (ref 0.0–0.4)
Eos: 0 %
Hematocrit: 47.7 % — ABNORMAL HIGH (ref 34.0–46.6)
Hemoglobin: 16 g/dL — ABNORMAL HIGH (ref 11.1–15.9)
Immature Grans (Abs): 0.1 10*3/uL (ref 0.0–0.1)
Immature Granulocytes: 1 %
Lymphocytes Absolute: 4.1 10*3/uL — ABNORMAL HIGH (ref 0.7–3.1)
Lymphs: 31 %
MCH: 27.7 pg (ref 26.6–33.0)
MCHC: 33.5 g/dL (ref 31.5–35.7)
MCV: 83 fL (ref 79–97)
Monocytes Absolute: 0.7 10*3/uL (ref 0.1–0.9)
Monocytes: 6 %
Neutrophils Absolute: 8.1 10*3/uL — ABNORMAL HIGH (ref 1.4–7.0)
Neutrophils: 61 %
Platelets: 379 10*3/uL (ref 150–450)
RBC: 5.77 x10E6/uL — ABNORMAL HIGH (ref 3.77–5.28)
RDW: 14.4 % (ref 11.7–15.4)
WBC: 13 10*3/uL — ABNORMAL HIGH (ref 3.4–10.8)

## 2022-03-27 LAB — COMPREHENSIVE METABOLIC PANEL
ALT: 14 IU/L (ref 0–32)
AST: 13 IU/L (ref 0–40)
Albumin/Globulin Ratio: 1.4 (ref 1.2–2.2)
Albumin: 4.2 g/dL (ref 3.8–4.9)
Alkaline Phosphatase: 126 IU/L — ABNORMAL HIGH (ref 44–121)
BUN/Creatinine Ratio: 8 — ABNORMAL LOW (ref 9–23)
BUN: 4 mg/dL — ABNORMAL LOW (ref 6–24)
Bilirubin Total: 0.5 mg/dL (ref 0.0–1.2)
CO2: 18 mmol/L — ABNORMAL LOW (ref 20–29)
Calcium: 9.6 mg/dL (ref 8.7–10.2)
Chloride: 104 mmol/L (ref 96–106)
Creatinine, Ser: 0.53 mg/dL — ABNORMAL LOW (ref 0.57–1.00)
Globulin, Total: 3 g/dL (ref 1.5–4.5)
Glucose: 171 mg/dL — ABNORMAL HIGH (ref 70–99)
Potassium: 3.4 mmol/L — ABNORMAL LOW (ref 3.5–5.2)
Sodium: 141 mmol/L (ref 134–144)
Total Protein: 7.2 g/dL (ref 6.0–8.5)
eGFR: 107 mL/min/{1.73_m2} (ref >59)

## 2022-03-27 LAB — LIPID PANEL
Chol/HDL Ratio: 8.7 ratio — ABNORMAL HIGH (ref 0.0–4.4)
Cholesterol, Total: 269 mg/dL — ABNORMAL HIGH (ref 100–199)
HDL: 31 mg/dL — ABNORMAL LOW (ref >39)
LDL Chol Calc (NIH): 203 mg/dL — ABNORMAL HIGH (ref 0–99)
Triglycerides: 180 mg/dL — ABNORMAL HIGH (ref 0–149)
VLDL Cholesterol Cal: 35 mg/dL (ref 5–40)

## 2022-03-27 LAB — HEMOGLOBIN A1C
Est. average glucose Bld gHb Est-mCnc: 177 mg/dL
Hgb A1c MFr Bld: 7.8 % — ABNORMAL HIGH (ref 4.8–5.6)

## 2022-03-27 LAB — SEDIMENTATION RATE: Sed Rate: 65 mm/hr — ABNORMAL HIGH (ref 0–40)

## 2022-03-29 LAB — ALKALINE PHOSPHATASE, ISOENZYMES
Alkaline Phosphatase: 127 IU/L — ABNORMAL HIGH (ref 44–121)
BONE FRACTION: 28 % (ref 14–68)
INTESTINAL FRAC.: 2 % (ref 0–18)
LIVER FRACTION: 70 % (ref 18–85)

## 2022-03-29 LAB — SPECIMEN STATUS REPORT

## 2022-03-31 ENCOUNTER — Other Ambulatory Visit: Payer: Self-pay | Admitting: Pharmacy Technician

## 2022-03-31 NOTE — Patient Outreach (Signed)
Patient has prescription drug coverage with [employer].  Harlee Pursifull J. Keerthi Hazell Patient Advocate Specialist [facility] 

## 2022-04-02 ENCOUNTER — Ambulatory Visit: Payer: Self-pay | Admitting: Gerontology

## 2022-04-26 ENCOUNTER — Other Ambulatory Visit: Payer: Self-pay

## 2022-04-26 ENCOUNTER — Emergency Department
Admission: EM | Admit: 2022-04-26 | Discharge: 2022-04-26 | Discharge status: Against Medical Advice | Payer: 59 | Attending: Emergency Medicine | Admitting: Emergency Medicine

## 2022-04-26 DIAGNOSIS — R110 Nausea: Secondary | ICD-10-CM | POA: Insufficient documentation

## 2022-04-26 DIAGNOSIS — R509 Fever, unspecified: Secondary | ICD-10-CM | POA: Insufficient documentation

## 2022-04-26 DIAGNOSIS — Z5321 Procedure and treatment not carried out due to patient leaving prior to being seen by health care provider: Secondary | ICD-10-CM | POA: Insufficient documentation

## 2022-04-26 DIAGNOSIS — R1031 Right lower quadrant pain: Secondary | ICD-10-CM | POA: Insufficient documentation

## 2022-04-26 LAB — COMPREHENSIVE METABOLIC PANEL
ALT: 11 U/L (ref 0–44)
AST: 23 U/L (ref 15–41)
Albumin: 3.4 g/dL — ABNORMAL LOW (ref 3.5–5.0)
Alkaline Phosphatase: 92 U/L (ref 38–126)
Anion gap: 10 (ref 5–15)
BUN: <5 mg/dL — ABNORMAL LOW (ref 6–20)
CO2: 22 mmol/L (ref 22–32)
Calcium: 8.9 mg/dL (ref 8.9–10.3)
Chloride: 107 mmol/L (ref 98–111)
Creatinine, Ser: 0.68 mg/dL (ref 0.44–1.00)
GFR, Estimated: >60 mL/min (ref >60)
Glucose, Bld: 164 mg/dL — ABNORMAL HIGH (ref 70–99)
Potassium: 3 mmol/L — ABNORMAL LOW (ref 3.5–5.1)
Sodium: 139 mmol/L (ref 135–145)
Total Bilirubin: 0.5 mg/dL (ref 0.3–1.2)
Total Protein: 7.1 g/dL (ref 6.5–8.1)

## 2022-04-26 LAB — CBC
HCT: 45.7 % (ref 36.0–46.0)
Hemoglobin: 15.6 g/dL — ABNORMAL HIGH (ref 12.0–15.0)
MCH: 28 pg (ref 26.0–34.0)
MCHC: 34.1 g/dL (ref 30.0–36.0)
MCV: 82 fL (ref 80.0–100.0)
Platelets: 449 10*3/uL — ABNORMAL HIGH (ref 150–400)
RBC: 5.57 MIL/uL — ABNORMAL HIGH (ref 3.87–5.11)
RDW: 14.7 % (ref 11.5–15.5)
WBC: 13.5 10*3/uL — ABNORMAL HIGH (ref 4.0–10.5)
nRBC: 0 % (ref 0.0–0.2)

## 2022-04-26 MED ORDER — ONDANSETRON 4 MG PO TBDP
ORAL_TABLET | ORAL | Status: AC
  Filled 2022-04-26: qty 1

## 2022-04-26 MED ORDER — ONDANSETRON 4 MG PO TBDP
4.0000 mg | ORAL_TABLET | Freq: Once | ORAL | Status: AC | PRN
  Administered 2022-04-26: 4 mg via ORAL

## 2022-04-26 NOTE — ED Triage Notes (Signed)
Pt states rlq pain with nausea. Pt states she is not sure if she has had a fever. Pt states has had burning with urination.

## 2022-05-02 ENCOUNTER — Encounter: Payer: Self-pay | Admitting: Emergency Medicine

## 2022-05-02 ENCOUNTER — Emergency Department
Admission: EM | Admit: 2022-05-02 | Discharge: 2022-05-02 | Disposition: A | Discharge status: Home / Self Care | Payer: 59 | Attending: Student in an Organized Health Care Education/Training Program | Admitting: Student in an Organized Health Care Education/Training Program

## 2022-05-02 ENCOUNTER — Other Ambulatory Visit: Payer: Self-pay

## 2022-05-02 DIAGNOSIS — R0902 Hypoxemia: Secondary | ICD-10-CM | POA: Diagnosis not present

## 2022-05-02 DIAGNOSIS — Z743 Need for continuous supervision: Secondary | ICD-10-CM | POA: Diagnosis not present

## 2022-05-02 DIAGNOSIS — T310 Burns involving less than 10% of body surface: Secondary | ICD-10-CM | POA: Diagnosis not present

## 2022-05-02 DIAGNOSIS — I10 Essential (primary) hypertension: Secondary | ICD-10-CM | POA: Diagnosis not present

## 2022-05-02 DIAGNOSIS — X158XXA Contact with other hot household appliances, initial encounter: Secondary | ICD-10-CM | POA: Insufficient documentation

## 2022-05-02 DIAGNOSIS — T24111A Burn of first degree of right thigh, initial encounter: Secondary | ICD-10-CM | POA: Diagnosis not present

## 2022-05-02 DIAGNOSIS — R000 Tachycardia, unspecified: Secondary | ICD-10-CM | POA: Diagnosis not present

## 2022-05-02 DIAGNOSIS — T25221A Burn of second degree of right foot, initial encounter: Secondary | ICD-10-CM | POA: Insufficient documentation

## 2022-05-02 DIAGNOSIS — T300 Burn of unspecified body region, unspecified degree: Secondary | ICD-10-CM | POA: Diagnosis not present

## 2022-05-02 MED ORDER — LIDOCAINE HCL URETHRAL/MUCOSAL 2 % EX GEL
1.0000 | Freq: Once | CUTANEOUS | Status: AC
  Administered 2022-05-02: 1 via TOPICAL
  Filled 2022-05-02: qty 6

## 2022-05-02 NOTE — ED Notes (Signed)
Lidocaine gel and Dressing applied to R leg and R foot
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
## 2022-05-02 NOTE — ED Notes (Signed)
E signature pad not working. Pt educated on discharge instructions and verbalized understanding.  
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
## 2022-06-23 DIAGNOSIS — G510 Bell's palsy: Secondary | ICD-10-CM | POA: Diagnosis not present

## 2022-06-23 DIAGNOSIS — E119 Type 2 diabetes mellitus without complications: Secondary | ICD-10-CM | POA: Diagnosis not present

## 2022-06-23 DIAGNOSIS — H25819 Combined forms of age-related cataract, unspecified eye: Secondary | ICD-10-CM | POA: Diagnosis not present

## 2023-03-19 ENCOUNTER — Other Ambulatory Visit: Payer: Self-pay

## 2023-03-19 ENCOUNTER — Emergency Department
Admission: EM | Admit: 2023-03-19 | Discharge: 2023-03-19 | Disposition: A | Discharge status: Home / Self Care | Payer: 59 | Attending: Emergency Medicine | Admitting: Emergency Medicine

## 2023-03-19 DIAGNOSIS — K5641 Fecal impaction: Secondary | ICD-10-CM | POA: Insufficient documentation

## 2023-03-19 DIAGNOSIS — K625 Hemorrhage of anus and rectum: Secondary | ICD-10-CM | POA: Diagnosis present

## 2023-03-19 DIAGNOSIS — E119 Type 2 diabetes mellitus without complications: Secondary | ICD-10-CM | POA: Diagnosis not present

## 2023-03-19 LAB — BASIC METABOLIC PANEL
Anion gap: 8 (ref 5–15)
BUN: 10 mg/dL (ref 6–20)
CO2: 19 mmol/L — ABNORMAL LOW (ref 22–32)
Calcium: 8.9 mg/dL (ref 8.9–10.3)
Chloride: 111 mmol/L (ref 98–111)
Creatinine, Ser: 0.58 mg/dL (ref 0.44–1.00)
GFR, Estimated: >60 mL/min (ref >60)
Glucose, Bld: 163 mg/dL — ABNORMAL HIGH (ref 70–99)
Potassium: 3.9 mmol/L (ref 3.5–5.1)
Sodium: 138 mmol/L (ref 135–145)

## 2023-03-19 LAB — CBC
HCT: 47.3 % — ABNORMAL HIGH (ref 36.0–46.0)
Hemoglobin: 15.6 g/dL — ABNORMAL HIGH (ref 12.0–15.0)
MCH: 29 pg (ref 26.0–34.0)
MCHC: 33 g/dL (ref 30.0–36.0)
MCV: 87.9 fL (ref 80.0–100.0)
Platelets: 365 10*3/uL (ref 150–400)
RBC: 5.38 MIL/uL — ABNORMAL HIGH (ref 3.87–5.11)
RDW: 14.7 % (ref 11.5–15.5)
WBC: 13.8 10*3/uL — ABNORMAL HIGH (ref 4.0–10.5)
nRBC: 0 % (ref 0.0–0.2)

## 2023-03-19 MED ORDER — ONDANSETRON 4 MG PO TBDP
4.0000 mg | ORAL_TABLET | Freq: Once | ORAL | Status: AC
Start: 2023-03-19 — End: 2023-03-19
  Administered 2023-03-19: 4 mg via ORAL
  Filled 2023-03-19: qty 1

## 2023-03-19 MED ORDER — OXYCODONE-ACETAMINOPHEN 5-325 MG PO TABS
1.0000 | ORAL_TABLET | Freq: Once | ORAL | Status: AC
  Administered 2023-03-19: 1 via ORAL
  Filled 2023-03-19: qty 1

## 2023-03-19 NOTE — ED Triage Notes (Signed)
Pt to ED [facility] from home for rectal bleeding started today. Reports x8 episodes. Also reports cannot have a BM and has a "bulge" at rectum.

## 2023-03-19 NOTE — Discharge Instructions (Signed)

## 2023-03-19 NOTE — ED Provider Notes (Signed)
[facility] Provider Note    Event Date/Time   First MD Initiated Contact with Patient 03/19/23 1246     (approximate)   History   Chief Complaint Rectal Bleeding   HPI  Laura Small is a 62 y.o. female with past medical history of hyperlipidemia, diabetes, and migraines who presents to the ED complaining of rectal bleeding.  Patient reports that she has been constipated for the past 5 days and has not been able to pass more than a small amount of hard stool.  Today when attempting to have a bowel movement.  Patient felt like the stool "got stuck" at her rectum and has been causing significant pain with sensation of muscle spasm since then.  She has noticed a small amount of bleeding along with leakage of stool.  She denies significant abdominal pain, nausea, or vomiting.     Physical Exam   Triage Vital Signs: ED Triage Vitals  Enc Vitals Group     BP 03/19/23 1134 (!) 146/89     Pulse Rate 03/19/23 1134 79     Resp 03/19/23 1134 18     Temp 03/19/23 1137 98.3 F (36.8 C)     Temp Source 03/19/23 1137 Oral     SpO2 03/19/23 1134 98 %     Weight 03/19/23 1134 182 lb (82.6 kg)     Height 03/19/23 1134 5\' 7"  (1.702 m)     Head Circumference --      Peak Flow --      Pain Score 03/19/23 1134 10     Pain Loc --      Pain Edu? --      Excl. in GC? --     Most recent vital signs: Vitals:   03/19/23 1134 03/19/23 1137  BP: (!) 146/89   Pulse: 79   Resp: 18   Temp:  98.3 F (36.8 C)  SpO2: 98%     Constitutional: Alert and oriented. Eyes: Conjunctivae are normal. Head: Atraumatic. Nose: No congestion/rhinnorhea. Mouth/Throat: Mucous membranes are moist.  Cardiovascular: Normal rate, regular rhythm. Grossly normal heart sounds.  2+ radial pulses bilaterally. Respiratory: Normal respiratory effort.  No retractions. Lungs CTAB. Gastrointestinal: Soft and nontender. No distention.  Rectal exam with large impacted stool ball, no  hemorrhoids or bleeding noted. Musculoskeletal: No lower extremity tenderness nor edema.  Neurologic:  Normal speech and language. No gross focal neurologic deficits are appreciated.    ED Results / Procedures / Treatments   Labs (all labs ordered are listed, but only abnormal results are displayed) Labs Reviewed  CBC - Abnormal; Notable for the following components:      Result Value   WBC 13.8 (*)    RBC 5.38 (*)    Hemoglobin 15.6 (*)    HCT 47.3 (*)    All other components within normal limits  BASIC METABOLIC PANEL - Abnormal; Notable for the following components:   CO2 19 (*)    Glucose, Bld 163 (*)    All other components within normal limits    PROCEDURES:  Critical Care performed: No  Procedures  ------------------------------------------------------------------------------------------------------------------- Fecal Disimpaction Procedure Note:  Performed by me:  Patient placed in the lateral recumbent position with knees drawn towards chest. Nurse present for patient support. Large amount of hard brown stool removed. No complications during procedure.   ------------------------------------------------------------------------------------------------------------------   MEDICATIONS ORDERED IN ED: Medications  oxyCODONE-acetaminophen (PERCOCET/ROXICET) 5-325 MG per tablet 1 tablet (1 tablet Oral Given 03/19/23 1326)  ondansetron (ZOFRAN-ODT) disintegrating tablet 4 mg (4 mg Oral Given 03/19/23 1326)     IMPRESSION / MDM / ASSESSMENT AND PLAN / ED COURSE  I reviewed the triage vital signs and the nursing notes.                              62 y.o. female with past medical history of hyperlipidemia, diabetes, and migraines who presents to the ED complaining of rectal discomfort and small amount of bleeding after struggling to have a bowel movement for the past 5 days.  Patient's presentation is most consistent with acute presentation with potential threat to  life or bodily function.  Differential diagnosis includes, but is not limited to, lower GI bleed, hemorrhoids, anemia, electrolyte abnormality, AKI, constipation, fecal impaction.  Patient uncomfortable but nontoxic-appearing and in no acute distress, vital signs are unremarkable.  Her abdominal exam is soft and benign, rectal exam shows large impacted stool ball with no active bleeding or hemorrhoids.  Stool ball was manually disimpacted and patient had significant relief in her symptoms.  She is appropriate for discharge home with PCP follow-up, was counseled on over-the-counter regimen for constipation.  She was counseled to return to the ED for new or worsening symptoms, patient agrees with plan.      FINAL CLINICAL IMPRESSION(S) / ED DIAGNOSES   Final diagnoses:  Fecal impaction (HCC)     Rx / DC Orders   ED Discharge Orders     None        Note:  This document was prepared using Dragon voice recognition software and may include unintentional dictation errors.   Chesley Noon, MD 03/19/23 [phone]

## 2023-03-19 NOTE — ED Notes (Signed)
First Nurse Note: Patient to ED via [facility] from home for rectal pain/rectal bleeding that started today after trying to have a BM today. States she tried to have BM 8 times this morning with pink blood.

## 2023-12-02 ENCOUNTER — Telehealth: Payer: Self-pay

## 2023-12-02 NOTE — Telephone Encounter (Signed)
 I was unable to leave a message for patient.  I sent a message to patient via MyChart letting him know that Dr. Marikay Alar has left our practice.  I asked patient to please call us and let us know if he would like to schedule a transfer of care appointment with Skip Estimable, MD, Bethanie Dicker, NP, or Kara Dies, NP.  When patient calls back, please schedule transfer of care appointment for him.

## 2023-12-23 NOTE — Telephone Encounter (Signed)
 Received a message stating patient has not read the message I sent to her via MyChart.  I called patient and received a message stating her voice mailbox has not been set up yet.  I mailed a letter to patient.

## 2024-06-22 DEATH — deceased
